# Patient Record
Sex: Male | Born: 1950 | Race: White | Hispanic: No
Health system: Southern US, Community
[De-identification: ages and names within clinical notes are randomized; demographics above are authoritative.]

## PROBLEM LIST (undated history)

## (undated) DIAGNOSIS — K259 Gastric ulcer, unspecified as acute or chronic, without hemorrhage or perforation: Secondary | ICD-10-CM

## (undated) DIAGNOSIS — F419 Anxiety disorder, unspecified: Secondary | ICD-10-CM

## (undated) DIAGNOSIS — R569 Unspecified convulsions: Secondary | ICD-10-CM

## (undated) DIAGNOSIS — F1010 Alcohol abuse, uncomplicated: Secondary | ICD-10-CM

## (undated) DIAGNOSIS — D500 Iron deficiency anemia secondary to blood loss (chronic): Secondary | ICD-10-CM

## (undated) DIAGNOSIS — K209 Esophagitis, unspecified: Secondary | ICD-10-CM

## (undated) DIAGNOSIS — K5520 Angiodysplasia of colon without hemorrhage: Secondary | ICD-10-CM

## (undated) DIAGNOSIS — Z9289 Personal history of other medical treatment: Secondary | ICD-10-CM

## (undated) DIAGNOSIS — I10 Essential (primary) hypertension: Secondary | ICD-10-CM

## (undated) DIAGNOSIS — K922 Gastrointestinal hemorrhage, unspecified: Secondary | ICD-10-CM

## (undated) DIAGNOSIS — S06369A Traumatic hemorrhage of cerebrum, unspecified, with loss of consciousness of unspecified duration, initial encounter: Secondary | ICD-10-CM

## (undated) DIAGNOSIS — I5032 Chronic diastolic (congestive) heart failure: Secondary | ICD-10-CM

## (undated) DIAGNOSIS — Z720 Tobacco use: Secondary | ICD-10-CM

## (undated) HISTORY — PX: CATARACT EXTRACTION W/ INTRAOCULAR LENS  IMPLANT, BILATERAL: SHX1307

## (undated) HISTORY — DX: Traumatic hemorrhage of cerebrum, unspecified, with loss of consciousness of unspecified duration, initial encounter: S06.369A

## (undated) HISTORY — DX: Gastric ulcer, unspecified as acute or chronic, without hemorrhage or perforation: K25.9

## (undated) HISTORY — DX: Alcohol abuse, uncomplicated: F10.10

## (undated) HISTORY — DX: Tobacco use: Z72.0

## (undated) HISTORY — DX: Angiodysplasia of colon without hemorrhage: K55.20

## (undated) HISTORY — DX: Unspecified convulsions: R56.9

## (undated) HISTORY — DX: Esophagitis, unspecified: K20.9

## (undated) HISTORY — DX: Iron deficiency anemia secondary to blood loss (chronic): D50.0

## (undated) HISTORY — DX: Chronic diastolic (congestive) heart failure: I50.32

---

## 2006-12-01 DIAGNOSIS — S0636AA Traumatic hemorrhage of cerebrum, unspecified, with loss of consciousness status unknown, initial encounter: Secondary | ICD-10-CM

## 2006-12-01 DIAGNOSIS — S06369A Traumatic hemorrhage of cerebrum, unspecified, with loss of consciousness of unspecified duration, initial encounter: Secondary | ICD-10-CM

## 2006-12-01 HISTORY — DX: Traumatic hemorrhage of cerebrum, unspecified, with loss of consciousness of unspecified duration, initial encounter: S06.369A

## 2006-12-01 HISTORY — DX: Traumatic hemorrhage of cerebrum, unspecified, with loss of consciousness status unknown, initial encounter: S06.36AA

## 2009-06-21 ENCOUNTER — Encounter: Admission: RE | Admit: 2009-06-21 | Discharge: 2009-06-21 | Payer: Self-pay | Admitting: Neurology

## 2011-09-26 ENCOUNTER — Other Ambulatory Visit: Payer: Self-pay

## 2011-09-26 ENCOUNTER — Encounter: Payer: Self-pay | Admitting: *Deleted

## 2011-09-26 ENCOUNTER — Emergency Department (HOSPITAL_COMMUNITY): Payer: Medicare Other

## 2011-09-26 ENCOUNTER — Inpatient Hospital Stay (HOSPITAL_COMMUNITY)
Admission: EM | Admit: 2011-09-26 | Discharge: 2011-09-28 | DRG: 812 | Disposition: A | Discharge status: Home / Self Care | Payer: Medicare Other | Attending: Internal Medicine | Admitting: Internal Medicine

## 2011-09-26 DIAGNOSIS — R0789 Other chest pain: Secondary | ICD-10-CM | POA: Diagnosis present

## 2011-09-26 DIAGNOSIS — D6489 Other specified anemias: Principal | ICD-10-CM | POA: Diagnosis present

## 2011-09-26 DIAGNOSIS — Z23 Encounter for immunization: Secondary | ICD-10-CM

## 2011-09-26 DIAGNOSIS — F17200 Nicotine dependence, unspecified, uncomplicated: Secondary | ICD-10-CM | POA: Diagnosis present

## 2011-09-26 DIAGNOSIS — Z8669 Personal history of other diseases of the nervous system and sense organs: Secondary | ICD-10-CM | POA: Insufficient documentation

## 2011-09-26 DIAGNOSIS — N179 Acute kidney failure, unspecified: Secondary | ICD-10-CM | POA: Diagnosis present

## 2011-09-26 DIAGNOSIS — D649 Anemia, unspecified: Secondary | ICD-10-CM | POA: Diagnosis present

## 2011-09-26 DIAGNOSIS — R079 Chest pain, unspecified: Secondary | ICD-10-CM | POA: Diagnosis present

## 2011-09-26 DIAGNOSIS — I951 Orthostatic hypotension: Secondary | ICD-10-CM | POA: Diagnosis present

## 2011-09-26 DIAGNOSIS — Z87828 Personal history of other (healed) physical injury and trauma: Secondary | ICD-10-CM | POA: Insufficient documentation

## 2011-09-26 DIAGNOSIS — Z7982 Long term (current) use of aspirin: Secondary | ICD-10-CM

## 2011-09-26 LAB — BASIC METABOLIC PANEL
BUN: 27 mg/dL — ABNORMAL HIGH (ref 6–23)
CO2: 23 mEq/L (ref 19–32)
Chloride: 96 mEq/L (ref 96–112)
Glucose, Bld: 247 mg/dL — ABNORMAL HIGH (ref 70–99)
Potassium: 3.7 mEq/L (ref 3.5–5.1)
Sodium: 133 mEq/L — ABNORMAL LOW (ref 135–145)

## 2011-09-26 LAB — DIFFERENTIAL
Eosinophils Absolute: 0 10*3/uL (ref 0.0–0.7)
Lymphocytes Relative: 16 % (ref 12–46)
Lymphs Abs: 0.8 10*3/uL (ref 0.7–4.0)
Monocytes Relative: 14 % — ABNORMAL HIGH (ref 3–12)
Neutro Abs: 3.5 10*3/uL (ref 1.7–7.7)
Neutrophils Relative %: 69 % (ref 43–77)

## 2011-09-26 LAB — PROTIME-INR
INR: 1.04 (ref 0.00–1.49)
Prothrombin Time: 13.8 seconds (ref 11.6–15.2)

## 2011-09-26 LAB — PREPARE RBC (CROSSMATCH)

## 2011-09-26 LAB — CBC
Hemoglobin: 7 g/dL — ABNORMAL LOW (ref 13.0–17.0)
MCH: 22.1 pg — ABNORMAL LOW (ref 26.0–34.0)
Platelets: 334 10*3/uL (ref 150–400)
RBC: 3.17 MIL/uL — ABNORMAL LOW (ref 4.22–5.81)
WBC: 5 10*3/uL (ref 4.0–10.5)

## 2011-09-26 LAB — POCT I-STAT TROPONIN I

## 2011-09-26 LAB — APTT: aPTT: 27 seconds (ref 24–37)

## 2011-09-26 MED ORDER — PANTOPRAZOLE SODIUM 40 MG IV SOLR
40.0000 mg | Freq: Two times a day (BID) | INTRAVENOUS | Status: DC
  Administered 2011-09-27 (×2): 40 mg via INTRAVENOUS
  Filled 2011-09-26 (×5): qty 40

## 2011-09-26 MED ORDER — INSULIN ASPART 100 UNIT/ML ~~LOC~~ SOLN
0.0000 [IU] | Freq: Three times a day (TID) | SUBCUTANEOUS | Status: DC
  Administered 2011-09-27: 2 [IU] via SUBCUTANEOUS
  Filled 2011-09-26: qty 3

## 2011-09-26 MED ORDER — SODIUM CHLORIDE 0.9 % IV SOLN
80.0000 mg | Freq: Once | INTRAVENOUS | Status: AC
  Administered 2011-09-26: 80 mg via INTRAVENOUS
  Filled 2011-09-26: qty 80

## 2011-09-26 MED ORDER — ALUM & MAG HYDROXIDE-SIMETH 200-200-20 MG/5ML PO SUSP: 30.0000 mL | Freq: Four times a day (QID) | ORAL | Status: DC | PRN

## 2011-09-26 MED ORDER — MORPHINE SULFATE 2 MG/ML IJ SOLN: 1.0000 mg | INTRAMUSCULAR | Status: DC | PRN

## 2011-09-26 MED ORDER — SODIUM CHLORIDE 0.9 % IV SOLN
INTRAVENOUS | Status: DC
  Administered 2011-09-27: 03:00:00 via INTRAVENOUS

## 2011-09-26 MED ORDER — ONDANSETRON HCL 4 MG/2ML IJ SOLN: 4.0000 mg | Freq: Four times a day (QID) | INTRAMUSCULAR | Status: DC | PRN

## 2011-09-26 MED ORDER — ACETAMINOPHEN 650 MG RE SUPP: 650.0000 mg | Freq: Four times a day (QID) | RECTAL | Status: DC | PRN

## 2011-09-26 MED ORDER — SODIUM CHLORIDE 0.9 % IV SOLN
8.0000 mg/h | INTRAVENOUS | Status: DC
  Administered 2011-09-26: 8 mg/h via INTRAVENOUS
  Filled 2011-09-26 (×2): qty 80

## 2011-09-26 MED ORDER — ONDANSETRON HCL 4 MG PO TABS: 4.0000 mg | ORAL_TABLET | Freq: Four times a day (QID) | ORAL | Status: DC | PRN

## 2011-09-26 MED ORDER — ACETAMINOPHEN 325 MG PO TABS: 650.0000 mg | ORAL_TABLET | Freq: Four times a day (QID) | ORAL | Status: DC | PRN

## 2011-09-26 MED ORDER — ASPIRIN 81 MG PO CHEW
324.0000 mg | CHEWABLE_TABLET | Freq: Once | ORAL | Status: AC
Start: 2011-09-26 — End: 2011-09-26
  Administered 2011-09-26: 324 mg via ORAL
  Filled 2011-09-26: qty 4

## 2011-09-26 NOTE — ED Provider Notes (Signed)
History     CSN: [id number]  Arrival date & time 09/26/11  1508   First MD Initiated Contact with Patient 09/26/11 1606      Chief Complaint  Patient presents with  . Chest Pain  . Shortness of Breath    (Consider location/radiation/quality/duration/timing/severity/associated sxs/prior treatment) HPI Comments: States had a similar episode approximately one week ago but did not last throughout the day.  Patient is a 60 y.o. male presenting with chest pain. The history is provided by the patient. No language interpreter was used.  Chest Pain The chest pain began 6 - 12 hours ago. Chest pain occurs intermittently. The chest pain is worsening. Associated with: no specific exacerbating measures. The severity of the pain is moderate. The quality of the pain is described as aching. The pain radiates to the lower back. Primary symptoms include palpitations. Pertinent negatives for primary symptoms include no fatigue, no syncope, no abdominal pain, no nausea, no vomiting, no dizziness and no altered mental status.  The palpitations began 6 to 12 hours ago. The onset of the palpitations was gradual. An episode of palpitations lasts for less than 5 minutes. The palpitations have occurred 2 to 5 time(s). The palpitations did not occur with dizziness.  Pertinent negatives for associated symptoms include no claudication, no diaphoresis, no near-syncope, no numbness and no weakness. He tried nothing for the symptoms.     Past Medical History  Diagnosis Date  . Seizures   . Head injury     History reviewed. No pertinent past surgical history.  History reviewed. No pertinent family history.  History  Substance Use Topics  . Smoking status: Current Everyday Smoker    Types: Cigarettes  . Smokeless tobacco: Not on file  . Alcohol Use:       Review of Systems  Constitutional: Negative for diaphoresis, activity change, appetite change and fatigue.  HENT: Negative for congestion, neck pain  and neck stiffness.   Respiratory: Negative for chest tightness.   Cardiovascular: Positive for chest pain and palpitations. Negative for claudication, syncope and near-syncope.  Gastrointestinal: Negative for nausea, vomiting and abdominal pain.  Genitourinary: Negative for dysuria, urgency, frequency and flank pain.  Musculoskeletal: Positive for back pain. Negative for myalgias, arthralgias and gait problem.  Neurological: Negative for dizziness, weakness, light-headedness, numbness and headaches.  Psychiatric/Behavioral: Negative for altered mental status.  All other systems reviewed and are negative.    Allergies  Review of patient's allergies indicates no known allergies.  Home Medications   Current Outpatient Rx  Name Route Sig Dispense Refill  . ASPIRIN 81 MG PO TABS Oral Take 81 mg by mouth daily.        BP 115/64  Pulse 96  Temp(Src) 97.4 F (36.3 C) (Oral)  Resp 18  SpO2 100%  Physical Exam  Nursing note and vitals reviewed. Constitutional: He is oriented to person, place, and time. He appears well-developed and well-nourished. No distress.  HENT:  Head: Normocephalic and atraumatic.  Mouth/Throat: Oropharynx is clear and moist.  Eyes: Conjunctivae and EOM are normal. Pupils are equal, round, and reactive to light.  Neck: Normal range of motion. Neck supple.  Cardiovascular: Normal rate, regular rhythm, normal heart sounds and intact distal pulses.  Exam reveals no gallop and no friction rub.   No murmur heard. Pulmonary/Chest: Effort normal and breath sounds normal. No respiratory distress. He has no wheezes. He exhibits no tenderness.  Abdominal: Soft. Bowel sounds are normal. There is no tenderness.  Genitourinary: Rectum normal. Guaiac  negative stool.  Musculoskeletal: Normal range of motion. He exhibits no tenderness.  Neurological: He is alert and oriented to person, place, and time. He has normal strength. No cranial nerve deficit or sensory deficit.    Skin: Skin is warm and dry. No rash noted.    ED Course  Procedures (including critical care time)   Date: 09/26/2011  Rate: 93  Rhythm: normal sinus rhythm  QRS Axis: normal  Intervals: normal  ST/T Wave abnormalities: normal  Conduction Disutrbances:none  Narrative Interpretation:   Old EKG Reviewed: none available  Labs Reviewed  CBC - Abnormal; Notable for the following:    RBC 3.17 (*)    Hemoglobin 7.0 (*)    HCT 23.0 (*)    MCV 72.6 (*)    MCH 22.1 (*)    RDW 16.9 (*)    All other components within normal limits  DIFFERENTIAL - Abnormal; Notable for the following:    Monocytes Relative 14 (*)    All other components within normal limits  BASIC METABOLIC PANEL - Abnormal; Notable for the following:    Sodium 133 (*)    Glucose, Bld 247 (*)    BUN 27 (*)    Creatinine, Ser 1.68 (*)    GFR calc non Af Amer 43 (*)    GFR calc Af Amer 49 (*)    All other components within normal limits  POCT I-STAT TROPONIN I  I-STAT TROPONIN I  POCT OCCULT BLOOD STOOL, DEVICE  APTT  PROTIME-INR  PREPARE RBC (CROSSMATCH)  TYPE AND SCREEN   Dg Chest 2 View  09/26/2011  *RADIOLOGY REPORT*  Clinical Data: Chest pain, tachycardia, smoker, numbness in both legs  CHEST - 2 VIEW  Comparison: None  Findings: Normal heart size, mediastinal contours, and pulmonary vascularity. Minimal emphysematous and bronchitic changes. No pulmonary infiltrate, pleural effusion or pneumothorax. No acute osseous findings.  IMPRESSION: Minimal emphysematous and bronchitic changes. No acute abnormalities.  Original Report Authenticated By: Lollie Marrow, M.D.     1. Chest pain   2. Anemia       MDM  Chest pain likely secondary to symptomatic anemia. His rectal exam is normal. Did not identify a source of his bleed therefore place him on a protonix gtt. He'll be given 2 units of packed red cells. He was given aspirin. He is chest pain-free. He has no primary care physician and was admitted to the  teaching service.        Dayton Bailiff, MD 09/26/11 [phone]

## 2011-09-26 NOTE — H&P (Signed)
Hospital Admission Note Date: 09/26/2011  Patient name: Troy Torres Medical record number: [id number] Date of birth: [birth year] Age: 60 y.o. Gender: male PCP: No primary provider on file.  Medical Service: B 1 [facility]  Attending physician:   Dr. Aundria Rud 1st Contact:   Dr. Dorise Hiss [phone] 2nd Contact:   Dr. Tonny Branch [phone] After 5 pm or weekends: 1st Contact:      Pager: [phone] 2nd Contact:      Pager: [phone]  Chief Complaint: chest pain and dizziness with position change  History of Present Illness: The pt is a 60 year-old male who comes into the ED with a 1 day history of chest pain. He stated that it started after he was eating some meatballs and he got the pain in his chest that radiated into his throat. It did not go into his head or jaw or arms. He sat down and the pain resolved within 5 minutes. After a while he stood and got dizzy feeling as though he would pass out. Also the pain returned. At that point he sat back down and was lying on the couch and the pain and dizziness resolved. He denied any sick contacts or symptoms of cold or flu recently. He did have one dark bowel movement in the last several weeks. He has also noticed recently an episode of red urine and has recently been urinating multiple times per day. No burning or dysuria. He does not have problems starting his stream. He is also having some low back pain associated with this chest pain episode and some numbness in his feet. He states that he has not seen a doctor in some time. He has lost about 10 pounds in the last 8 months. He is a .5 PPD smoker. He does occasionally get a sour taste in his mouth.   Meds: Medications Prior to Admission  Medication Dose Route Frequency Provider Last Rate Last Dose  . aspirin chewable tablet 324 mg  324 mg Oral Once Dayton Bailiff, MD   324 mg at 09/26/11 1637  . pantoprazole (PROTONIX) 80 mg in sodium chloride 0.9 % 100 mL IVPB  80 mg Intravenous Once Dayton Bailiff, MD        . pantoprazole (PROTONIX) 80 mg in sodium chloride 0.9 % 250 mL infusion  8 mg/hr Intravenous Continuous Dayton Bailiff, MD       No current outpatient prescriptions on file as of 09/26/2011.    Allergies: Review of patient's allergies indicates no known allergies. Past Medical History  Diagnosis Date  . Seizures   . Head injury    History reviewed. No pertinent past surgical history. History reviewed. No pertinent family history. History   Social History  . Marital Status: Single    Spouse Name: N/A    Number of Children: N/A  . Years of Education: N/A   Occupational History  . Not on file.   Social History Main Topics  . Smoking status: Current Everyday Smoker    Types: Cigarettes  . Smokeless tobacco: Not on file  . Alcohol Use:   . Drug Use:   . Sexually Active:    Other Topics Concern  . Not on file   Social History Narrative  . No narrative on file    Review of Systems: Pertinent items are noted in HPI.  Physical Exam: Blood pressure 115/64, pulse 96, temperature 97.4 F (36.3 C), temperature source Oral, resp. rate 18, SpO2 100.00%. General: resting in bed, pleasant, cooperative HEENT: PERRL, EOMI, no scleral  icterus Cardiac: RRR, no rubs, murmurs or gallops Pulm: clear to auscultation bilaterally, moving normal volumes of air Abd: soft, nontender, nondistended, BS present Ext: warm and well perfused, no pedal edema Neuro: alert and oriented X3, cranial nerves II-XII grossly intact   Lab results: Basic Metabolic Panel:  Basename 09/26/11 1633  NA 133*  K 3.7  CL 96  CO2 23  GLUCOSE 247*  BUN 27*  CREATININE 1.68*  CALCIUM 9.8  MG --  PHOS --  CBC:  Basename 09/26/11 1633  WBC 5.0  NEUTROABS 3.5  HGB 7.0*  HCT 23.0*  MCV 72.6*  PLT 334   Cardiac Enzymes: Troponin ED 0.01  Coagulation:  Basename 09/26/11 1811  LABPROT 13.8  INR 1.04     Imaging results:  Dg Chest 2 View  09/26/2011  *RADIOLOGY REPORT*  Clinical Data:  Chest pain, tachycardia, smoker, numbness in both legs  CHEST - 2 VIEW  Comparison: None  Findings: Normal heart size, mediastinal contours, and pulmonary vascularity. Minimal emphysematous and bronchitic changes. No pulmonary infiltrate, pleural effusion or pneumothorax. No acute osseous findings.  IMPRESSION: Minimal emphysematous and bronchitic changes. No acute abnormalities.  Original Report Authenticated By: Lollie Marrow, M.D.    Other results: EKG: there are no previous tracings available for comparison, normal sinus rhythm, ST elevation in V3, questionable elevation in V4.  Assessment & Plan by Problem: Principal Problem:  *Anemia - Hg 7.0. Will give 2 units of PRBCs in the hospital and follow his CBC. If he is hemodynamically stable then he may be able to have colonoscopy and EGD as out-pt. If he is still having drops in Hg during hospital stay then he may need then as an in-pt. Will check anemia panel and FOBT several more times. Concerning for cancer as there has been weight loss and probable iron deficiency anemia. One episode of dark bowel movement in patient history. Getting protonix and maalox.   Dizziness - probable orthostatic hypotension. BP is only 115/64 and baseline is unknown.    AKI - Cr is 1.69. Baseline is unknown but may be due to anemia causing relative hypovolemia to the kidneys.   Hyperglycemia - will check HgA1C especially considering family history of DM. Will put on SSI in case blood sugars remain elevated.   Active Problems:  Chest pain - likely due to ischemia related angina. Will trend enzymes. No pattern for ischemia on the EKG and no elevation of troponin in the ED.    SignedGenella Mech 09/26/2011, 6:44 PM

## 2011-09-26 NOTE — ED Notes (Signed)
4710-01 READY

## 2011-09-26 NOTE — ED Notes (Signed)
Pt reports waking up this am with sob, lower back pain and chest pain. Airway is intact. ekg done at triage.

## 2011-09-27 ENCOUNTER — Encounter (HOSPITAL_COMMUNITY): Payer: Self-pay

## 2011-09-27 LAB — LIPID PANEL
Cholesterol: 190 mg/dL (ref 0–200)
HDL: 64 mg/dL (ref >39)
LDL Cholesterol: 112 mg/dL — ABNORMAL HIGH (ref 0–99)
Total CHOL/HDL Ratio: 3 RATIO
Triglycerides: 69 mg/dL (ref <150)
VLDL: 14 mg/dL (ref 0–40)

## 2011-09-27 LAB — URINALYSIS, ROUTINE W REFLEX MICROSCOPIC
Hgb urine dipstick: NEGATIVE
Nitrite: NEGATIVE
Protein, ur: NEGATIVE mg/dL
Specific Gravity, Urine: 1.024 (ref 1.005–1.030)
Urobilinogen, UA: 1 mg/dL (ref 0.0–1.0)

## 2011-09-27 LAB — GLUCOSE, CAPILLARY
Glucose-Capillary: 118 mg/dL — ABNORMAL HIGH (ref 70–99)
Glucose-Capillary: 118 mg/dL — ABNORMAL HIGH (ref 70–99)
Glucose-Capillary: 110 mg/dL — ABNORMAL HIGH (ref 70–99)

## 2011-09-27 LAB — HEPATIC FUNCTION PANEL
Bilirubin, Direct: 0.1 mg/dL (ref 0.0–0.3)
Indirect Bilirubin: 0.6 mg/dL (ref 0.3–0.9)
Total Protein: 6.8 g/dL (ref 6.0–8.3)

## 2011-09-27 LAB — CARDIAC PANEL(CRET KIN+CKTOT+MB+TROPI)
CK, MB: 3.7 ng/mL (ref 0.3–4.0)
Relative Index: 3.2 — ABNORMAL HIGH (ref 0.0–2.5)
Relative Index: 2.9 — ABNORMAL HIGH (ref 0.0–2.5)
Total CK: 119 U/L (ref 7–232)
Total CK: 140 U/L (ref 7–232)
Troponin I: <0.3 ng/mL (ref <0.30)

## 2011-09-27 LAB — BASIC METABOLIC PANEL
BUN: 23 mg/dL (ref 6–23)
Creatinine, Ser: 1.36 mg/dL — ABNORMAL HIGH (ref 0.50–1.35)
GFR calc non Af Amer: 55 mL/min — ABNORMAL LOW (ref >90)
Glucose, Bld: 83 mg/dL (ref 70–99)
Potassium: 3.9 mEq/L (ref 3.5–5.1)

## 2011-09-27 LAB — HEMOGLOBIN A1C: Mean Plasma Glucose: 108 mg/dL (ref <117)

## 2011-09-27 LAB — IRON AND TIBC: UIBC: 380 ug/dL (ref 125–400)

## 2011-09-27 LAB — CBC
MCH: 23.7 pg — ABNORMAL LOW (ref 26.0–34.0)
MCV: 74.2 fL — ABNORMAL LOW (ref 78.0–100.0)
Platelets: 313 10*3/uL (ref 150–400)
RBC: 3.72 MIL/uL — ABNORMAL LOW (ref 4.22–5.81)
RDW: 17.1 % — ABNORMAL HIGH (ref 11.5–15.5)
WBC: 5.9 10*3/uL (ref 4.0–10.5)

## 2011-09-27 LAB — RETICULOCYTES
RBC.: 3.41 MIL/uL — ABNORMAL LOW (ref 4.22–5.81)
Retic Count, Absolute: 40.9 10*3/uL (ref 19.0–186.0)
Retic Ct Pct: 1.2 % (ref 0.4–3.1)

## 2011-09-27 LAB — FERRITIN: Ferritin: 6 ng/mL — ABNORMAL LOW (ref 22–322)

## 2011-09-27 LAB — TSH: TSH: 0.925 u[IU]/mL (ref 0.350–4.500)

## 2011-09-27 MED ORDER — FERROUS SULFATE 325 (65 FE) MG PO TABS
325.0000 mg | ORAL_TABLET | Freq: Three times a day (TID) | ORAL | Status: DC
  Administered 2011-09-27 – 2011-09-28 (×3): 325 mg via ORAL
  Filled 2011-09-27 (×5): qty 1

## 2011-09-27 MED ORDER — INFLUENZA VIRUS VACC SPLIT PF IM SUSP
0.5000 mL | INTRAMUSCULAR | Status: AC
  Administered 2011-09-27: 0.5 mL via INTRAMUSCULAR
  Filled 2011-09-27: qty 0.5

## 2011-09-27 NOTE — Progress Notes (Signed)
Subjective: Pt is sitting up in bed, he is feeling fine. He is not having any bleeding anywhere, no chest pain. He has not felt dizzy upon standing. He would like to shower this morning. No pain anywhere and no SOB. He did receive 2 units of PRBCs overnight and his Hg did improve. We talked about the fact that he will need colonoscopy and it may be as an out patient. He verbalized understanding and agreement with the plan.   Objective: Vital signs in last 24 hours: Filed Vitals:   09/27/11 0130 09/27/11 0145 09/27/11 0200 09/27/11 0215  BP: 119/70 126/71 167/74 146/68  Pulse: 60 62 65 67  Temp: 98.2 F (36.8 C) 98.5 F (36.9 C) 98.6 F (37 C) 98.2 F (36.8 C)  TempSrc: Oral Oral Oral Oral  Resp: 18 18 16 18   Height:      Weight:      SpO2:       Weight change:   Intake/Output Summary (Last 24 hours) at 09/27/11 1049 Last data filed at 09/27/11 0803  Gross per 24 hour  Intake 1082.5 ml  Output    201 ml  Net  881.5 ml   Physical Exam: General: resting in bed HEENT: PERRL, EOMI, no scleral icterus Cardiac: RRR, no rubs, murmurs or gallops Pulm: clear to auscultation bilaterally, moving normal volumes of air Abd: soft, nontender, nondistended, BS present Ext: warm and well perfused, no pedal edema Neuro: alert and oriented X3, cranial nerves II-XII grossly intact  Lab Results: Basic Metabolic Panel:  Lab 09/27/11 1610 09/26/11 1633  NA 137 133*  K 3.9 3.7  CL 103 96  CO2 24 23  GLUCOSE 83 247*  BUN 23 27*  CREATININE 1.36* 1.68*  CALCIUM 9.0 9.8  MG -- --  PHOS -- --   Liver Function Tests:  Lab 09/26/11 2341  AST 19  ALT 13  ALKPHOS 72  BILITOT 0.7  PROT 6.8  ALBUMIN 3.6   CBC:  Lab 09/27/11 0600 09/26/11 1633  WBC 5.9 5.0  NEUTROABS -- 3.5  HGB 8.8* 7.0*  HCT 27.6* 23.0*  MCV 74.2* 72.6*  PLT 313 334   Cardiac Enzymes:  Lab 09/27/11 0600 09/26/11 2349  CKTOTAL 113 119  CKMB 3.3 3.8  CKMBINDEX -- --  TROPONINI <0.30 <0.30   CBG:  Lab  09/27/11 0551  GLUCAP 190*   Hemoglobin A1C:  Lab 09/26/11 2341  HGBA1C 5.4   Fasting Lipid Panel:  Lab 09/26/11 2340  CHOL 190  HDL 64  LDLCALC 112*  TRIG 69  CHOLHDL 3.0  LDLDIRECT --   Thyroid Function Tests:  Lab 09/26/11 2341  TSH 0.925  T4TOTAL --  FREET4 --  T3FREE --  THYROIDAB --   Coagulation:  Lab 09/26/11 1811  LABPROT 13.8  INR 1.04   Anemia Panel:  Lab 09/26/11 2341  VITAMINB12 547  FOLATE 14.9  FERRITIN 6*  TIBC 407  IRON 27*  RETICCTPCT 1.2   Studies/Results: Dg Chest 2 View  09/26/2011  *RADIOLOGY REPORT*  Clinical Data: Chest pain, tachycardia, smoker, numbness in both legs  CHEST - 2 VIEW  Comparison: None  Findings: Normal heart size, mediastinal contours, and pulmonary vascularity. Minimal emphysematous and bronchitic changes. No pulmonary infiltrate, pleural effusion or pneumothorax. No acute osseous findings.  IMPRESSION: Minimal emphysematous and bronchitic changes. No acute abnormalities.  Original Report Authenticated By: Lollie Marrow, M.D.   Medications: I have reviewed the patient's current medications. Scheduled Meds:   . aspirin  324 mg Oral Once  . influenza  inactive virus vaccine  0.5 mL Intramuscular NOW  . insulin aspart  0-9 Units Subcutaneous TID WC  . pantoprazole (PROTONIX) IV  80 mg Intravenous Once  . pantoprazole (PROTONIX) IV  40 mg Intravenous Q12H   Continuous Infusions:   . DISCONTD: sodium chloride 50 mL/hr at 09/27/11 0254  . DISCONTD: pantoprozole (PROTONIX) infusion 8 mg/hr (09/26/11 2111)   PRN Meds:.acetaminophen, acetaminophen, alum & mag hydroxide-simeth, morphine, ondansetron (ZOFRAN) IV, ondansetron Assessment/Plan: Principal Problem:  *Anemia - spoke with the pt about the fact that this could be a serious problem such as cancer and he needs colonoscopy. He verbalized understanding and agreement. Will call GI office and schedule as out-pt. Spoke with GI and they felt that this could wait until  out-pt. Will watch pt overnight for stabilization of his CBC for 24 hours. His Hg improved to 8.8 with 2 units of PRBCs.   Active Problems:  Chest pain  - resolved, no elevation in the CE's. Likely due to the anemia.    Acute kidney injury - will follow and get urine creatinine and sodium to determine if it is pre-renal. Cr is down to 1.36 today so likely was pre-renal. He is tolerating PO well so will saline lock his IVF so that he can shower. Will follow his BMP tomorrow.    LOS: 1 day   Genella Mech 09/27/2011, 10:49 AM

## 2011-09-27 NOTE — H&P (Signed)
79 man with several months of wgt loss and few months of intermittant red blood in stool.  Admitted for 1-2 days of dizzyness. Found to have hgb 7 with MCV 72 and ferritin 6.  He has no pain and is feeling better after 2 units.  Creatinine of 1.7 may be pre-renal as UA is normal.  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  Will ask GI to arrange colonoscopy in near future. No other worrisome labs or other findings.  Agree with residents' plans and notes.

## 2011-09-28 LAB — GLUCOSE, CAPILLARY: Glucose-Capillary: 90 mg/dL (ref 70–99)

## 2011-09-28 LAB — CBC
MCV: 74.7 fL — ABNORMAL LOW (ref 78.0–100.0)
Platelets: 307 10*3/uL (ref 150–400)
RBC: 3.96 MIL/uL — ABNORMAL LOW (ref 4.22–5.81)
WBC: 4.5 10*3/uL (ref 4.0–10.5)

## 2011-09-28 LAB — BASIC METABOLIC PANEL
CO2: 25 mEq/L (ref 19–32)
Chloride: 105 mEq/L (ref 96–112)
Sodium: 140 mEq/L (ref 135–145)

## 2011-09-28 LAB — TYPE AND SCREEN
ABO/RH(D): B POS
Unit division: 0

## 2011-09-28 MED ORDER — PANTOPRAZOLE SODIUM 40 MG PO TBEC
40.0000 mg | DELAYED_RELEASE_TABLET | Freq: Two times a day (BID) | ORAL | Status: DC
  Administered 2011-09-28: 40 mg via ORAL
  Filled 2011-09-28: qty 1

## 2011-09-28 MED ORDER — FERROUS SULFATE 325 (65 FE) MG PO TABS: 325.0000 mg | ORAL_TABLET | Freq: Three times a day (TID) | ORAL | Status: DC

## 2011-09-28 NOTE — Progress Notes (Signed)
Internal Medicine Attending  Date: 09/28/2011  Patient name: Troy Torres Medical record number: [id number] Date of birth: 03/27/1951 Age: 60 y.o. Gender: male  I saw and evaluated the patient. I reviewed the resident's note by Dr. Dorise Hiss and I agree with the resident's findings and plans as documented in her note.  I discussed with patient the importance of the planned outpatient GI evaluation given his anemia and weight loss.

## 2011-09-28 NOTE — Discharge Summary (Signed)
Internal Medicine Teaching [facility] Discharge Note  Name: Troy Torres MRN: [id number] DOB: October 07, 1950 60 y.o.  Date of Admission: 09/26/2011  3:09 PM Date of Discharge: 09/28/2011 Attending Physician: Ulyess Mort  Discharge Diagnosis: Principal Problem:  *Anemia Active Problems:  Chest pain  Acute kidney injury   Discharge Medications: Current Discharge Medication List    START taking these medications   Details  ferrous sulfate 325 (65 FE) MG tablet Take 1 tablet (325 mg total) by mouth 3 (three) times daily with meals. Qty: 90 tablet, Refills: 0      CONTINUE these medications which have NOT CHANGED   Details  aspirin 81 MG tablet Take 81 mg by mouth daily.           Disposition and follow-up:   [name] was discharged from [facility] in Good condition.    Follow-up Appointments: [facility], Oct 04 2011 at 1045 AM. At this time please schedule for colonoscopy.   [facility] clinic -  10/16/10 at 11:00 AM with Dr. Larey Seat. At this time please follow up and make sure patient went to [facility] to get colonoscopy and establish a PCP. Recheck CBC to ensure stability. Pt should still be taking iron supplements. Please recheck BMP to evaluate for resolution of AKI.    Consultations:  None  Procedures Performed:  Dg Chest 2 View  09/26/2011  *RADIOLOGY REPORT*  Clinical Data: Chest pain, tachycardia, smoker, numbness in both legs  CHEST - 2 VIEW  Comparison: None  Findings: Normal heart size, mediastinal contours, and pulmonary vascularity. Minimal emphysematous and bronchitic changes. No pulmonary infiltrate, pleural effusion or pneumothorax. No acute osseous findings.  IMPRESSION: Minimal emphysematous and bronchitic changes. No acute abnormalities.  Original Report Authenticated By: Lollie Marrow, M.D.   Admission HPI: The pt is a 60 year-old male who comes into the ED with a 1 day history of chest pain. He stated that it started after he was  eating some meatballs and he got the pain in his chest that radiated into his throat. It did not go into his head or jaw or arms. He sat down and the pain resolved within 5 minutes. After a while he stood and got dizzy feeling as though he would pass out. Also the pain returned. At that point he sat back down and was lying on the couch and the pain and dizziness resolved. He denied any sick contacts or symptoms of cold or flu recently. He did have one dark bowel movement in the last several weeks. He has also noticed recently an episode of red urine and has recently been urinating multiple times per day. No burning or dysuria. He does not have problems starting his stream. He is also having some low back pain associated with this chest pain episode and some numbness in his feet. He states that he has not seen a doctor in some time. He has lost about 10 pounds in the last 8 months. He is a .5 PPD smoker. He does occasionally get a sour taste in his mouth.   Physical Exam: (on admission) Blood pressure 115/64, pulse 96, temperature 97.4 F (36.3 C), temperature source Oral, resp. rate 18, SpO2 100.00%.  General: resting in bed, pleasant, cooperative  HEENT: PERRL, EOMI, no scleral icterus  Cardiac: RRR, no rubs, murmurs or gallops  Pulm: clear to auscultation bilaterally, moving normal volumes of air  Abd: soft, nontender, nondistended, BS present  Ext: warm and well perfused, no pedal edema  Neuro: alert and oriented X3, cranial nerves II-XII grossly intact   Hospital Course by problem list: Principal Problem:  *Anemia - the patient was admitted to with hemoglobin of 7, with no obvious source of bleeding. He was given 2 units of packed red blood cells during his hospital course his hemoglobin improved to 8.8 and then was stable for 24 hours and was 9.3 on the day of discharge. We did talk to GI who felt that a colonoscopy could be completed as an outpatient and a followup appointment with [facility] was  scheduled for January 2. He will get outpatient colonoscopy. We will also see him in our clinic to establish care for her primary prevention. We did run an anemia panel which did show that this anemia is due to iron deficiency. The patient was started on iron during his hospital course. We will continue to have him take as an outpatient. He had no obvious signs of source for bleeding and malignancy of the colon was concerned this concern was discussed with the patient the seriousness of this implication was discussed with him. He did verbalize understanding and agreement with our plan to get outpatient colonoscopy.  Active Problems:  Chest pain - the patient did present with chest pain there was concern for anemia related ischemia versus GERD pain. He also is experiencing some orthostatic hypotension at the time of admission which did resolve during the course of admission. His chest pain did not recur during this hospitalization. 3 sets of cardiac enzymes were obtained and were all negative. An EKG was obtained at the time of admission and was showing no changes concerning for acute myocardial infarction. Patient was informed that if he has a repeat of his chest pain he should seek medical attention.   Acute kidney injury - the patient's creatinine was 1.68 at the time of admission with no current baseline. We did give him IV fluids and draw a urine creatinine and sodium to connect with the male. He now is less than 1% which would indicate prerenal acute kidney injury. The IV fluids did decrease his creatinine and creatinine on the day of discharge was 0.97. Would recommend followup BMP at that some point to evaluate for chronic kidney insufficiency versus acute kidney injury.   Discharge Vitals:  BP 101/58  Pulse 67  Temp(Src) 97.9 F (36.6 C) (Oral)  Resp 16  Ht 5\' 8"  (1.727 m)  Wt 141 lb 11.2 oz (64.275 kg)  BMI 21.55 kg/m2  SpO2 100%  Discharge Labs:  Results for orders placed during the  hospital encounter of 09/26/11 (from the past 24 hour(s))  BASIC METABOLIC PANEL     Status: Abnormal   Collection Time   09/27/11  9:21 AM      Component Value Range   Sodium 137  135 - 145 (mEq/L)   Potassium 3.9  3.5 - 5.1 (mEq/L)   Chloride 103  96 - 112 (mEq/L)   CO2 24  19 - 32 (mEq/L)   Glucose, Bld 83  70 - 99 (mg/dL)   BUN 23  6 - 23 (mg/dL)   Creatinine, Ser 2.44 (*) 0.50 - 1.35 (mg/dL)   Calcium 9.0  8.4 - 01.0 (mg/dL)   GFR calc non Af Amer 55 (*) >90 (mL/min)   GFR calc Af Amer 64 (*) >90 (mL/min)  GLUCOSE, CAPILLARY     Status: Abnormal   Collection Time   09/27/11 11:17 AM      Component Value Range  Glucose-Capillary 118 (*) 70 - 99 (mg/dL)  CARDIAC PANEL(CRET KIN+CKTOT+MB+TROPI)     Status: Abnormal   Collection Time   09/27/11 12:00 PM      Component Value Range   Total CK 140  7 - 232 (U/L)   CK, MB 3.7  0.3 - 4.0 (ng/mL)   Troponin I <0.30  <0.30 (ng/mL)   Relative Index 2.6 (*) 0.0 - 2.5   GLUCOSE, CAPILLARY     Status: Abnormal   Collection Time   09/27/11  4:45 PM      Component Value Range   Glucose-Capillary 118 (*) 70 - 99 (mg/dL)   Comment 1 Notify RN    CREATININE, URINE, RANDOM     Status: Normal   Collection Time   09/27/11  7:39 PM      Component Value Range   Creatinine, Urine 43.04    SODIUM, URINE, RANDOM     Status: Normal   Collection Time   09/27/11  7:39 PM      Component Value Range   Sodium, Ur 24    GLUCOSE, CAPILLARY     Status: Abnormal   Collection Time   09/27/11 10:03 PM      Component Value Range   Glucose-Capillary 110 (*) 70 - 99 (mg/dL)   Comment 1 Notify RN     Comment 2 Documented in Chart    CBC     Status: Abnormal   Collection Time   09/28/11  6:00 AM      Component Value Range   WBC 4.5  4.0 - 10.5 (K/uL)   RBC 3.96 (*) 4.22 - 5.81 (MIL/uL)   Hemoglobin 9.3 (*) 13.0 - 17.0 (g/dL)   HCT 16.1 (*) 09.6 - 52.0 (%)   MCV 74.7 (*) 78.0 - 100.0 (fL)   MCH 23.5 (*) 26.0 - 34.0 (pg)   MCHC 31.4  30.0 - 36.0  (g/dL)   RDW 04.5 (*) 40.9 - 15.5 (%)   Platelets 307  150 - 400 (K/uL)  BASIC METABOLIC PANEL     Status: Abnormal   Collection Time   09/28/11  6:00 AM      Component Value Range   Sodium 140  135 - 145 (mEq/L)   Potassium 4.0  3.5 - 5.1 (mEq/L)   Chloride 105  96 - 112 (mEq/L)   CO2 25  19 - 32 (mEq/L)   Glucose, Bld 95  70 - 99 (mg/dL)   BUN 15  6 - 23 (mg/dL)   Creatinine, Ser 8.11  0.50 - 1.35 (mg/dL)   Calcium 9.2  8.4 - 91.4 (mg/dL)   GFR calc non Af Amer 88 (*) >90 (mL/min)   GFR calc Af Amer >90  >90 (mL/min)  GLUCOSE, CAPILLARY     Status: Normal   Collection Time   09/28/11  6:07 AM      Component Value Range   Glucose-Capillary 90  70 - 99 (mg/dL)    Signed: Genella Mech 09/28/2011, 8:11 AM

## 2011-09-28 NOTE — Progress Notes (Signed)
Subjective: Pt is sitting up in bed, he is feeling fine. He is not having any bleeding anywhere, no chest pain. He has not felt dizzy upon standing. He did shower yesterday. No pain anywhere and no SOB. He did receive 2 units of PRBCs during hospital stay and his Hg did improve. We talked about the fact that he will need colonoscopy and it will be as an out patient. He verbalized understanding and agreement with the plan.   Objective: Vital signs in last 24 hours: Filed Vitals:   09/27/11 0215 09/27/11 1442 09/27/11 2205 09/28/11 0446  BP: 146/68 121/70 102/69 101/58  Pulse: 67 72 63 67  Temp: 98.2 F (36.8 C) 98 F (36.7 C) 98.1 F (36.7 C) 97.9 F (36.6 C)  TempSrc: Oral  Oral Oral  Resp: 18 18 18 16   Height:      Weight:    141 lb 11.2 oz (64.275 kg)  SpO2:  96% 100% 100%   Weight change: -11.5 oz (-0.325 kg)  Intake/Output Summary (Last 24 hours) at 09/28/11 0740 Last data filed at 09/28/11 0449  Gross per 24 hour  Intake   1180 ml  Output    200 ml  Net    980 ml   Physical Exam: General: resting in bed HEENT: PERRL, EOMI, no scleral icterus Cardiac: RRR, no rubs, murmurs or gallops Pulm: clear to auscultation bilaterally, moving normal volumes of air Abd: soft, nontender, nondistended, BS present Ext: warm and well perfused, no pedal edema Neuro: alert and oriented X3, cranial nerves II-XII grossly intact  Lab Results: Basic Metabolic Panel:  Lab 09/28/11 1610 09/27/11 0921  NA 140 137  K 4.0 3.9  CL 105 103  CO2 25 24  GLUCOSE 95 83  BUN 15 23  CREATININE 0.97 1.36*  CALCIUM 9.2 9.0  MG -- --  PHOS -- --   Liver Function Tests:  Lab 09/26/11 2341  AST 19  ALT 13  ALKPHOS 72  BILITOT 0.7  PROT 6.8  ALBUMIN 3.6   CBC:  Lab 09/28/11 0600 09/27/11 0600 09/26/11 1633  WBC 4.5 5.9 --  NEUTROABS -- -- 3.5  HGB 9.3* 8.8* --  HCT 29.6* 27.6* --  MCV 74.7* 74.2* --  PLT 307 313 --   Cardiac Enzymes:  Lab 09/27/11 1200 09/27/11 0600 09/26/11 2349   CKTOTAL 140 113 119  CKMB 3.7 3.3 3.8  CKMBINDEX -- -- --  TROPONINI <0.30 <0.30 <0.30   CBG:  Lab 09/28/11 0607 09/27/11 2203 09/27/11 1645 09/27/11 1117 09/27/11 0551  GLUCAP 90 110* 118* 118* 190*   Hemoglobin A1C:  Lab 09/26/11 2341  HGBA1C 5.4   Fasting Lipid Panel:  Lab 09/26/11 2340  CHOL 190  HDL 64  LDLCALC 112*  TRIG 69  CHOLHDL 3.0  LDLDIRECT --   Thyroid Function Tests:  Lab 09/26/11 2341  TSH 0.925  T4TOTAL --  FREET4 --  T3FREE --  THYROIDAB --   Coagulation:  Lab 09/26/11 1811  LABPROT 13.8  INR 1.04   Anemia Panel:  Lab 09/26/11 2341  VITAMINB12 547  FOLATE 14.9  FERRITIN 6*  TIBC 407  IRON 27*  RETICCTPCT 1.2   Studies/Results: Dg Chest 2 View  09/26/2011  *RADIOLOGY REPORT*  Clinical Data: Chest pain, tachycardia, smoker, numbness in both legs  CHEST - 2 VIEW  Comparison: None  Findings: Normal heart size, mediastinal contours, and pulmonary vascularity. Minimal emphysematous and bronchitic changes. No pulmonary infiltrate, pleural effusion or pneumothorax. No acute osseous  findings.  IMPRESSION: Minimal emphysematous and bronchitic changes. No acute abnormalities.  Original Report Authenticated By: Lollie Marrow, M.D.   Medications: I have reviewed the patient's current medications. Scheduled Meds:    . ferrous sulfate  325 mg Oral TID WC  . influenza  inactive virus vaccine  0.5 mL Intramuscular NOW  . insulin aspart  0-9 Units Subcutaneous TID WC  . pantoprazole (PROTONIX) IV  40 mg Intravenous Q12H   Continuous Infusions:    . DISCONTD: sodium chloride 50 mL/hr at 09/27/11 0254   PRN Meds:.acetaminophen, acetaminophen, alum & mag hydroxide-simeth, morphine, ondansetron (ZOFRAN) IV, ondansetron Assessment/Plan: Principal Problem:  *Anemia - spoke with the pt about the fact that this could be a serious problem such as cancer and he needs colonoscopy. He verbalized understanding and agreement. Will call GI office and  schedule as out-pt. Spoke with GI and they felt that this could wait until out-pt, pt has apt with [facility] on Jan 2nd at 10:45 AM. Hg is stable and was 9.3 this morning. The patient voiced concern that he has lost about 8 pounds in the last month and I again stressed the need that this may be a malignant cause and he needs to follow up with colonoscopy.   Active Problems:  Chest pain  - resolved, no elevation in the CE's. Likely due to the anemia.    Acute kidney injury - will follow and get urine creatinine and sodium to determine if it is pre-renal. Cr is down to 0.97 today so likely was pre-renal. He is tolerating PO well so will saline lock his IVF so that he can shower.   Disposition - Pt will be discharged home today with the appropriate follow up.   LOS: 2 days   Genella Mech 09/28/2011, 7:40 AM

## 2011-09-28 NOTE — Progress Notes (Signed)
IV removed.  No active bleeding noted at this time.  Verbalized understanding of discharge instructions.

## 2011-10-03 DIAGNOSIS — K5520 Angiodysplasia of colon without hemorrhage: Secondary | ICD-10-CM

## 2011-10-03 DIAGNOSIS — K209 Esophagitis, unspecified without bleeding: Secondary | ICD-10-CM

## 2011-10-03 DIAGNOSIS — K259 Gastric ulcer, unspecified as acute or chronic, without hemorrhage or perforation: Secondary | ICD-10-CM

## 2011-10-03 HISTORY — DX: Esophagitis, unspecified without bleeding: K20.90

## 2011-10-03 HISTORY — DX: Angiodysplasia of colon without hemorrhage: K55.20

## 2011-10-03 HISTORY — DX: Gastric ulcer, unspecified as acute or chronic, without hemorrhage or perforation: K25.9

## 2011-10-17 ENCOUNTER — Encounter: Payer: Medicare Other | Admitting: Internal Medicine

## 2011-10-25 ENCOUNTER — Encounter: Payer: Self-pay | Admitting: Internal Medicine

## 2011-10-25 ENCOUNTER — Ambulatory Visit (INDEPENDENT_AMBULATORY_CARE_PROVIDER_SITE_OTHER): Payer: Medicare Other | Admitting: Internal Medicine

## 2011-10-25 DIAGNOSIS — R079 Chest pain, unspecified: Secondary | ICD-10-CM

## 2011-10-25 DIAGNOSIS — F17200 Nicotine dependence, unspecified, uncomplicated: Secondary | ICD-10-CM

## 2011-10-25 DIAGNOSIS — D649 Anemia, unspecified: Secondary | ICD-10-CM

## 2011-10-25 DIAGNOSIS — N179 Acute kidney failure, unspecified: Secondary | ICD-10-CM

## 2011-10-25 DIAGNOSIS — Z8669 Personal history of other diseases of the nervous system and sense organs: Secondary | ICD-10-CM

## 2011-10-25 MED ORDER — OMEPRAZOLE 20 MG PO CPDR: DELAYED_RELEASE_CAPSULE | ORAL | Status: DC

## 2011-10-25 NOTE — Patient Instructions (Signed)
You can try the 1-800-QUITNOW hotline to assist in stopping smoking You should also no longer drink alcohol as this will irritate your stomach and keep your ulcer from healing  Please return to clinic in 1-3 months to see Dr. Yaakov Guthrie, who will be your new primary care physician

## 2011-10-25 NOTE — Progress Notes (Signed)
Record request sent to Dr Imagene Gurney [facility]).  Records requested and received from Dr Madilyn Fireman [facility]) office.Kingsley Spittle Cassady1/23/201312:03 PM

## 2011-10-26 ENCOUNTER — Encounter: Payer: Self-pay | Admitting: Internal Medicine

## 2011-10-26 DIAGNOSIS — Z720 Tobacco use: Secondary | ICD-10-CM | POA: Insufficient documentation

## 2011-10-26 LAB — CBC
MCH: 25 pg — ABNORMAL LOW (ref 26.0–34.0)
Platelets: 274 10*3/uL (ref 150–400)
RDW: 25.1 % — ABNORMAL HIGH (ref 11.5–15.5)
WBC: 5 10*3/uL (ref 4.0–10.5)

## 2011-10-26 LAB — COMPLETE METABOLIC PANEL WITH GFR
ALT: 11 U/L (ref 0–53)
AST: 17 U/L (ref 0–37)
Albumin: 4.2 g/dL (ref 3.5–5.2)
Alkaline Phosphatase: 51 U/L (ref 39–117)
BUN: 11 mg/dL (ref 6–23)
Calcium: 9.3 mg/dL (ref 8.4–10.5)
Chloride: 106 mEq/L (ref 96–112)
Potassium: 4.5 mEq/L (ref 3.5–5.3)
Total Bilirubin: 0.6 mg/dL (ref 0.3–1.2)

## 2011-10-26 NOTE — Assessment & Plan Note (Signed)
No CP or SOB since hospital discharge

## 2011-10-26 NOTE — Assessment & Plan Note (Addendum)
Iron deficiency- ferritin 6 during admission.  Hgb today 10.7, up from on hospital discharge. No hemoptysis or hematemesis since leaving hospital. Is taking iron pills without discomfort and MCV is up to 83.  Continue iron pills.  GI upper endoscopy earlier this month found gastric ulcer.  Not noted as bleeding at that time in GI records we obtained.  Wrote patient script for prilosec 40mg  daily one month then 20mg  daily indefinitely.  H pylori stain was negative

## 2011-10-26 NOTE — Assessment & Plan Note (Signed)
Cr 0.88 today, back to normal range on discharge and stable today.

## 2011-10-26 NOTE — Assessment & Plan Note (Signed)
Patient interested in quitting.  Gave number for 1-800-Quitnow

## 2011-10-26 NOTE — Progress Notes (Signed)
Addended by: Emeline General on: 10/26/2011 09:28 PM   Modules accepted: Level of Service

## 2011-10-26 NOTE — Assessment & Plan Note (Signed)
Sending for note from his old neurologist Dr. Sandria Manly.  Based on those records will consider restarting anti-sz med or possible neurology referral if either is indicated.

## 2011-10-26 NOTE — Progress Notes (Signed)
  Subjective:   Patient ID: Troy Torres male   DOB: 11/30/1950 61 y.o.   MRN: [id number]  HPI: [name] is a 61 y.o. here for hospital follow-up after being admitted for CP/anemia/AKI.  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  He denies any CP or SOB since discharge.  No hemoptysis or hematemesis.      Past Medical History  Diagnosis Date  . Seizures   . Head injury    Current Outpatient Prescriptions  Medication Sig Dispense Refill  . aspirin 81 MG tablet Take 81 mg by mouth daily.        . ferrous sulfate 325 (65 FE) MG tablet Take 1 tablet (325 mg total) by mouth 3 (three) times daily with meals.  90 tablet  0  . omeprazole (PRILOSEC) 20 MG capsule Take two capsules (40mg ) by mouth once daily for one month, Then take one capsule (20mg ) by mouth once daily after that  60 capsule  6   Family History  Problem Relation Age of Onset  . Diabetes Mother   . Diabetes Sister    History   Social History  . Marital Status: Legally Separated    Spouse Name: N/A    Number of Children: N/A  . Years of Education: N/A   Social History Main Topics  . Smoking status: Current Everyday Smoker -- 0.5 packs/day    Types: Cigarettes  . Smokeless tobacco: None  . Alcohol Use: None  . Drug Use: None  . Sexually Active: None   Other Topics Concern  . None   Social History Narrative  . None   Review of Systems: Constitutional: Denies fever, chills, diaphoresis, appetite change and fatigue.     Respiratory: Denies SOB, DOE, cough, chest tightness,  and wheezing.   Cardiovascular: Denies chest pain, palpitations and leg swelling.  Gastrointestinal: Denies nausea, vomiting, abdominal pain, diarrhea, constipation, blood in stool and abdominal distention.  Genitourinary: Denies dysuria, urgency, frequency, hematuria, flank pain and  difficulty urinating.  Skin: Denies pallor, rash and wound.  Neurological: Denies dizziness, seizures, syncope, weakness, light-headedness, numbness and headaches.    Objective:  Physical Exam: Filed Vitals:   10/25/11 1059  BP: 141/77  Pulse: 64  Temp: 97.2 F (36.2 C)  TempSrc: Oral  Height: 5\' 8"  (1.727 m)  Weight: 150 lb (68.04 kg)   Constitutional: Vital signs reviewed.  Patient is a well-developed and well-nourished man in no acute distress and cooperative with exam. Alert and oriented x3.  Head: Normocephalic and atraumatic  Mouth: no erythema or exudates, MMM Eyes: PERRL, EOMI, conjunctivae normal, No scleral icterus.  Neck: No JVD or carotid bruit present.  Cardiovascular: RRR, S1 normal, S2 normal, no MRG, pulses symmetric and intact bilaterally Pulmonary/Chest: CTAB, no wheezes, rales, or rhonchi Abdominal: Soft. Non-tender, non-distended, bowel sounds are normal   Neurological: A&O x3, Strength is normal and symmetric bilaterally, cranial nerve II-XII are grossly intact, no focal motor deficit, sensory intact to light touch bilaterally.  Skin: Warm, dry and intact. No rash, cyanosis, or clubbing.     Assessment & Plan:

## 2011-10-30 NOTE — Progress Notes (Signed)
agree

## 2011-12-04 ENCOUNTER — Encounter: Payer: Medicare Other | Admitting: Internal Medicine

## 2011-12-04 ENCOUNTER — Encounter: Payer: Self-pay | Admitting: Internal Medicine

## 2011-12-04 NOTE — Progress Notes (Signed)
Records obtained from [facility].  As of 12/16/2009 pt was supposed to be on Depakote ER 500mg  TID for history of seizures.  Patient no-showed to appt today- will attempt to get patient to return to clinic to discuss his seizure history and possibly restart this medicine.

## 2011-12-19 ENCOUNTER — Encounter: Payer: Self-pay | Admitting: Internal Medicine

## 2011-12-19 ENCOUNTER — Other Ambulatory Visit: Payer: Self-pay | Admitting: Family Medicine

## 2011-12-19 ENCOUNTER — Ambulatory Visit (INDEPENDENT_AMBULATORY_CARE_PROVIDER_SITE_OTHER): Payer: Medicare Other | Admitting: Internal Medicine

## 2011-12-19 VITALS — BP 157/90 | HR 99 | Temp 96.5°F | Ht 68.0 in | Wt 147.9 lb

## 2011-12-19 DIAGNOSIS — F17200 Nicotine dependence, unspecified, uncomplicated: Secondary | ICD-10-CM

## 2011-12-19 DIAGNOSIS — Z8669 Personal history of other diseases of the nervous system and sense organs: Secondary | ICD-10-CM

## 2011-12-19 DIAGNOSIS — R251 Tremor, unspecified: Secondary | ICD-10-CM | POA: Insufficient documentation

## 2011-12-19 DIAGNOSIS — K2971 Gastritis, unspecified, with bleeding: Secondary | ICD-10-CM

## 2011-12-19 DIAGNOSIS — K2970 Gastritis, unspecified, without bleeding: Secondary | ICD-10-CM | POA: Insufficient documentation

## 2011-12-19 DIAGNOSIS — K2990 Gastroduodenitis, unspecified, without bleeding: Secondary | ICD-10-CM

## 2011-12-19 DIAGNOSIS — D649 Anemia, unspecified: Secondary | ICD-10-CM

## 2011-12-19 DIAGNOSIS — K259 Gastric ulcer, unspecified as acute or chronic, without hemorrhage or perforation: Secondary | ICD-10-CM | POA: Insufficient documentation

## 2011-12-19 DIAGNOSIS — R112 Nausea with vomiting, unspecified: Secondary | ICD-10-CM | POA: Insufficient documentation

## 2011-12-19 MED ORDER — OMEPRAZOLE 20 MG PO CPDR: DELAYED_RELEASE_CAPSULE | ORAL | Status: DC

## 2011-12-19 NOTE — Assessment & Plan Note (Signed)
Things to smoke cigarettes approximately one pack per week
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
## 2011-12-19 NOTE — Assessment & Plan Note (Signed)
Resume PPI therapy, patient followup with Dr. Madilyn Fireman as needed

## 2011-12-19 NOTE — Patient Instructions (Signed)
Resume taking the omeprazole.  This will help your gastritis and ulcer. If your symptoms do not resolve please call the clinic or follow-up with Dr. Madilyn Fireman.

## 2011-12-19 NOTE — Assessment & Plan Note (Signed)
Likely secondary to exacerbation of gastritis

## 2011-12-19 NOTE — Assessment & Plan Note (Signed)
Patient to resume PPI therapy, prescription for omeprazole had already been refilled the patient did not know that he could pick it up from the pharmacy
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
## 2012-01-01 ENCOUNTER — Encounter: Payer: Self-pay | Admitting: Internal Medicine

## 2012-01-01 ENCOUNTER — Ambulatory Visit (INDEPENDENT_AMBULATORY_CARE_PROVIDER_SITE_OTHER): Payer: Medicare Other | Admitting: Internal Medicine

## 2012-01-01 VITALS — BP 152/79 | HR 69 | Temp 97.0°F | Ht 68.0 in | Wt 153.7 lb

## 2012-01-01 DIAGNOSIS — D649 Anemia, unspecified: Secondary | ICD-10-CM

## 2012-01-01 DIAGNOSIS — Z8669 Personal history of other diseases of the nervous system and sense organs: Secondary | ICD-10-CM

## 2012-01-01 DIAGNOSIS — F17200 Nicotine dependence, unspecified, uncomplicated: Secondary | ICD-10-CM

## 2012-01-01 DIAGNOSIS — K259 Gastric ulcer, unspecified as acute or chronic, without hemorrhage or perforation: Secondary | ICD-10-CM

## 2012-01-01 MED ORDER — VARENICLINE TARTRATE 0.5 MG X 11 & 1 MG X 42 PO MISC: ORAL | Status: DC

## 2012-01-01 MED ORDER — FERROUS SULFATE 325 (65 FE) MG PO TABS: 325.0000 mg | ORAL_TABLET | Freq: Three times a day (TID) | ORAL | Status: DC

## 2012-01-01 MED ORDER — VARENICLINE TARTRATE 1 MG PO TABS: 1.0000 mg | ORAL_TABLET | Freq: Two times a day (BID) | ORAL | Status: DC

## 2012-01-01 NOTE — Patient Instructions (Signed)
Please return to clinic in 3-4 weeks to discuss progress of your smoking cessation

## 2012-01-02 LAB — FERRITIN: Ferritin: 15 ng/mL — ABNORMAL LOW (ref 22–322)

## 2012-01-02 LAB — CBC
MCH: 27.2 pg (ref 26.0–34.0)
MCHC: 32.1 g/dL (ref 30.0–36.0)
MCV: 84.6 fL (ref 78.0–100.0)
Platelets: 324 10*3/uL (ref 150–400)
RBC: 4.75 MIL/uL (ref 4.22–5.81)

## 2012-01-11 ENCOUNTER — Other Ambulatory Visit (INDEPENDENT_AMBULATORY_CARE_PROVIDER_SITE_OTHER): Payer: Medicare Other

## 2012-01-11 DIAGNOSIS — D649 Anemia, unspecified: Secondary | ICD-10-CM

## 2012-01-11 LAB — POC HEMOCCULT BLD/STL (HOME/3-CARD/SCREEN): Card #3 Fecal Occult Blood, POC: POSITIVE

## 2012-01-14 NOTE — Assessment & Plan Note (Signed)
Patient wishes to quit, is down to 1 pack/wk.  Interested in quitting.  Prescribed Chantix today, told him to pick a quit date and start Chantix 1 wk before.  Plan is to continue Chantix for 12 wks, if he quits successfully this may be extended another 12 weeks.  Next return appt will be for smoking cessation fu.

## 2012-01-14 NOTE — Assessment & Plan Note (Signed)
No symptoms of CP, SOB, nausea, or vomiting.  Will check ferritin, cbc, fobt.

## 2012-01-14 NOTE — Assessment & Plan Note (Signed)
Checking CBC, FOBT today, Ferritin.  Will continue iron supplementation for now pending ferritin result

## 2012-01-14 NOTE — Progress Notes (Signed)
  Subjective:   Patient ID: Troy Torres male   DOB: 26-Jan-1951 61 y.o.   MRN: [id number]  HPI: [name] is a 61 y.o. with history of gastric ulcer and gastritis here for follow-up.  No CP, SOB, or nausea or vomiting.  Ran out of iron supplement a couple weeks ago.    Quit drinking 2 months ago.    Past Medical History  Diagnosis Date  . Seizures   . Head injury    Current Outpatient Prescriptions  Medication Sig Dispense Refill  . aspirin 81 MG tablet Take 81 mg by mouth daily.        . ferrous sulfate 325 (65 FE) MG tablet Take 1 tablet (325 mg total) by mouth 3 (three) times daily with meals.  90 tablet  3  . omeprazole (PRILOSEC) 20 MG capsule Take two capsules (40mg ) by mouth once daily for one month, Then take one capsule (20mg ) by mouth once daily after that  60 capsule  6  . varenicline (CHANTIX CONTINUING MONTH PAK) 1 MG tablet Take 1 tablet (1 mg total) by mouth 2 (two) times daily.  60 tablet  1  . varenicline (CHANTIX STARTING MONTH PAK) 0.5 MG X 11 & 1 MG X 42 tablet Take one 0.5 mg pill by mouth once daily for 3 days, then take one 0.5 mg pill twice daily for 4 days, then take one 1 mg pill twice daily.  53 tablet  0   Family History  Problem Relation Age of Onset  . Diabetes Mother   . Diabetes Sister    History   Social History  . Marital Status: Legally Separated    Spouse Name: N/A    Number of Children: N/A  . Years of Education: N/A   Social History Main Topics  . Smoking status: Current Everyday Smoker -- 0.2 packs/day    Types: Cigarettes  . Smokeless tobacco: None  . Alcohol Use: None  . Drug Use: None  . Sexually Active: None   Other Topics Concern  . None   Social History Narrative  . None   Review of Systems: Constitutional: Denies fever, chills, diaphoresis, appetite change and fatigue.  Respiratory: Denies SOB, DOE, cough, chest tightness,  and wheezing.   Cardiovascular: Denies chest pain, palpitations  Gastrointestinal: Denies  nausea, vomiting, abdominal pain, diarrhea, constipation, blood in stool and abdominal distention.  Genitourinary: Denies dysuria, urgency, frequency, hematuria, flank pain and difficulty urinating.  Skin: Denies pallor, rash and wound.  Neurological: Denies dizziness, seizures, syncope, weakness, light-headedness, numbness and headaches.    Objective:  Physical Exam: Filed Vitals:   01/01/12 1438  BP: 152/79  Pulse: 69  Temp: 97 F (36.1 C)  TempSrc: Oral  Height: 5\' 8"  (1.727 m)  Weight: 153 lb 11.2 oz (69.718 kg)   Constitutional: Vital signs reviewed.  Patient is a well-developed and well-nourished man in no acute distress and cooperative with exam. Alert and oriented x3.  Head: Normocephalic and atraumatic  Eyes: PERRL, EOMI, conjunctivae normal, No scleral icterus.  Neck: No JVD  Cardiovascular: RRR, S1 normal, S2 normal, no MRG, pulses symmetric and intact bilaterally Pulmonary/Chest: CTAB, no wheezes, rales, or rhonchi Abdominal: Soft. Non-tender, non-distended, bowel sounds are normal, no masses or guarding present.  Neurological: A&O x3, Strength is normal and symmetric bilaterally, cranial nerve II-XII are grossly intact, no focal motor deficit Skin: Warm, dry and intact. No rash, cyanosis, or clubbing.     Assessment & Plan:

## 2012-01-22 ENCOUNTER — Ambulatory Visit (INDEPENDENT_AMBULATORY_CARE_PROVIDER_SITE_OTHER): Payer: Medicare Other | Admitting: Internal Medicine

## 2012-01-22 VITALS — BP 140/80 | HR 71 | Temp 96.8°F | Ht 68.0 in | Wt 153.3 lb

## 2012-01-22 DIAGNOSIS — H6120 Impacted cerumen, unspecified ear: Secondary | ICD-10-CM | POA: Insufficient documentation

## 2012-01-22 DIAGNOSIS — F17200 Nicotine dependence, unspecified, uncomplicated: Secondary | ICD-10-CM

## 2012-01-22 DIAGNOSIS — D649 Anemia, unspecified: Secondary | ICD-10-CM

## 2012-01-22 MED ORDER — CARBAMIDE PEROXIDE 6.5 % OT SOLN: 5.0000 [drp] | Freq: Two times a day (BID) | OTIC | Status: AC

## 2012-01-22 NOTE — Patient Instructions (Signed)
Return to clinic in 1 month

## 2012-02-01 ENCOUNTER — Encounter: Payer: Self-pay | Admitting: Internal Medicine

## 2012-02-01 NOTE — Assessment & Plan Note (Signed)
Still FOBT positive based on recent test.  Hgb looked good.  Ferritin 15, increased from 6 on therapy since January.  He should continue PO iron and take his iron with 1/2 glass orange juice to increase absorption.  No gross blood in stools, no melena, no blood in vomit.  Since he has recent endoscopies, will continue to monitor with periodic FOBTs and CBCs.  Instructed him on signs of increased bleeding (blood in vomit or stools, dark vomit, melena).

## 2012-02-01 NOTE — Assessment & Plan Note (Signed)
Started Chantix last week.  Down to 2 cigarettes per day.  Discussed picking a quit day with him to quit entirely.  Follow-up cessation progress at next visit.

## 2012-02-01 NOTE — Progress Notes (Signed)
Subjective:   Patient ID: Troy Torres male   DOB: 1951-05-29 61 y.o.   MRN: [id number]  HPI: [name] is a 61 y.o. with history of ulcer/upper GI bleed who is here for follow-up.  We recently discussed smoking cessation and he is now down to 2 cigarettes per day, started Chantix last week.  No CP, no blood in stool or vomit, no vomiting, no melena.  No constipation or diarrhea.  Is doing well per his report.  Still abstaining from alcohol.  Has been taking his iron supplement.  Reports 2 weeks of L ear ringing.  No dizzyness.  No pain.  Some dullness of hearing in the ear at time.    Past Medical History  Diagnosis Date  . Seizures   . Head injury    Current Outpatient Prescriptions  Medication Sig Dispense Refill  . aspirin 81 MG tablet Take 81 mg by mouth daily.        . carbamide peroxide (DEBROX) 6.5 % otic solution Place 5 drops into both ears 2 (two) times daily. For 4 days.  Apply to one ear at a time and keep head tilted for 4 minutes each ear  15 mL  0  . ferrous sulfate 325 (65 FE) MG tablet Take 1 tablet (325 mg total) by mouth 3 (three) times daily with meals.  90 tablet  3  . omeprazole (PRILOSEC) 20 MG capsule Take two capsules (40mg ) by mouth once daily for one month, Then take one capsule (20mg ) by mouth once daily after that  60 capsule  6  . varenicline (CHANTIX CONTINUING MONTH PAK) 1 MG tablet Take 1 tablet (1 mg total) by mouth 2 (two) times daily.  60 tablet  1  . varenicline (CHANTIX STARTING MONTH PAK) 0.5 MG X 11 & 1 MG X 42 tablet Take one 0.5 mg pill by mouth once daily for 3 days, then take one 0.5 mg pill twice daily for 4 days, then take one 1 mg pill twice daily.  53 tablet  0   Family History  Problem Relation Age of Onset  . Diabetes Mother   . Diabetes Sister    History   Social History  . Marital Status: Legally Separated    Spouse Name: N/A    Number of Children: N/A  . Years of Education: N/A   Social History Main Topics  . Smoking  status: Current Everyday Smoker -- 0.2 packs/day    Types: Cigarettes  . Smokeless tobacco: None  . Alcohol Use: None  . Drug Use: None  . Sexually Active: None   Other Topics Concern  . None   Social History Narrative  . None   Review of Systems: Constitutional: Denies fever, chills, diaphoresis, appetite change and fatigue.  Respiratory: Denies SOB, DOE, cough, chest tightness,  and wheezing.   Cardiovascular: Denies chest pain, palpitations and leg swelling.  Gastrointestinal: Denies nausea, vomiting, abdominal pain, diarrhea, constipation, blood in stool and abdominal distention.  Genitourinary: Denies dysuria, urgency, frequency, hematuria, flank pain and difficulty urinating.  Musculoskeletal: Denies myalgias, back pain, joint swelling, arthralgias and gait problem.  Skin: Denies pallor, rash and wound.  Neurological: Denies dizziness, recent seizures, syncope, weakness, light-headedness, numbness and headaches.    Objective:  Physical Exam: Filed Vitals:   01/22/12 1335  BP: 140/80  Pulse: 71  Temp: 96.8 F (36 C)  TempSrc: Oral  Height: 5\' 8"  (1.727 m)  Weight: 153 lb 4.8 oz (69.536 kg)   Constitutional: Vital signs  reviewed.  Patient is a well-developed and well-nourished man in no acute distress and cooperative with exam. Alert and oriented x3.  Head: Normocephalic and atraumatic Ear: L ear canal is entirely clogged with ear wax, cannot visualize the ear drum.  R canal is partially obstructed and can visualize ~50% of tympanic membrane.   Mouth: no erythema or exudates, MMM Eyes: PERRL, EOMI, conjunctivae normal, No scleral icterus.  Neck:  No JVD  Cardiovascular: RRR, S1 normal, S2 normal, no MRG, pulses symmetric and intact bilaterally Pulmonary/Chest: CTAB, no wheezes, rales, or rhonchi Abdominal: Soft. Non-tender, non-distended, bowel sounds are normal, no masses, organomegaly, or guarding present.  Neurological: A&O x3, Strength is normal and symmetric  bilaterally, cranial nerve II-XII are grossly intact, no focal motor deficit   Assessment & Plan:

## 2012-02-01 NOTE — Assessment & Plan Note (Signed)
Likely cause of his L ear ringing.  Large amount of ear wax bilaterally, L> R, cannot see L ear drum as a result.  Prescribed Debrox.  Re-assess on follow-up.

## 2012-02-19 ENCOUNTER — Encounter: Payer: Medicare Other | Admitting: Internal Medicine

## 2012-03-20 ENCOUNTER — Inpatient Hospital Stay (HOSPITAL_COMMUNITY)
Admission: EM | Admit: 2012-03-20 | Discharge: 2012-03-22 | DRG: 379 | Disposition: A | Discharge status: Home / Self Care | Payer: Medicare Other | Attending: Infectious Disease | Admitting: Infectious Disease

## 2012-03-20 ENCOUNTER — Encounter (HOSPITAL_COMMUNITY): Payer: Self-pay | Admitting: Emergency Medicine

## 2012-03-20 ENCOUNTER — Emergency Department (HOSPITAL_COMMUNITY): Payer: Medicare Other

## 2012-03-20 DIAGNOSIS — K922 Gastrointestinal hemorrhage, unspecified: Secondary | ICD-10-CM

## 2012-03-20 DIAGNOSIS — Z79899 Other long term (current) drug therapy: Secondary | ICD-10-CM

## 2012-03-20 DIAGNOSIS — R259 Unspecified abnormal involuntary movements: Secondary | ICD-10-CM | POA: Diagnosis present

## 2012-03-20 DIAGNOSIS — Z23 Encounter for immunization: Secondary | ICD-10-CM

## 2012-03-20 DIAGNOSIS — Z7982 Long term (current) use of aspirin: Secondary | ICD-10-CM

## 2012-03-20 DIAGNOSIS — R112 Nausea with vomiting, unspecified: Secondary | ICD-10-CM | POA: Diagnosis present

## 2012-03-20 DIAGNOSIS — F1010 Alcohol abuse, uncomplicated: Secondary | ICD-10-CM | POA: Diagnosis present

## 2012-03-20 DIAGNOSIS — K5521 Angiodysplasia of colon with hemorrhage: Secondary | ICD-10-CM | POA: Diagnosis present

## 2012-03-20 DIAGNOSIS — K2921 Alcoholic gastritis with bleeding: Principal | ICD-10-CM | POA: Diagnosis present

## 2012-03-20 DIAGNOSIS — F17200 Nicotine dependence, unspecified, uncomplicated: Secondary | ICD-10-CM | POA: Diagnosis present

## 2012-03-20 LAB — COMPREHENSIVE METABOLIC PANEL
ALT: 18 U/L (ref 0–53)
AST: 37 U/L (ref 0–37)
Alkaline Phosphatase: 89 U/L (ref 39–117)
CO2: 19 mEq/L (ref 19–32)
Calcium: 9.7 mg/dL (ref 8.4–10.5)
GFR calc non Af Amer: >90 mL/min (ref >90)
Glucose, Bld: 116 mg/dL — ABNORMAL HIGH (ref 70–99)
Potassium: 4.2 mEq/L (ref 3.5–5.1)
Sodium: 139 mEq/L (ref 135–145)
Total Protein: 7.9 g/dL (ref 6.0–8.3)

## 2012-03-20 LAB — PROTIME-INR
INR: 0.95 (ref 0.00–1.49)
Prothrombin Time: 12.9 seconds (ref 11.6–15.2)

## 2012-03-20 LAB — CBC
Hemoglobin: 13.2 g/dL (ref 13.0–17.0)
MCH: 28.9 pg (ref 26.0–34.0)
Platelets: 163 10*3/uL (ref 150–400)
RBC: 4.57 MIL/uL (ref 4.22–5.81)

## 2012-03-20 LAB — GLUCOSE, CAPILLARY: Glucose-Capillary: 128 mg/dL — ABNORMAL HIGH (ref 70–99)

## 2012-03-20 LAB — ABO/RH: ABO/RH(D): B POS

## 2012-03-20 LAB — LACTIC ACID, PLASMA: Lactic Acid, Venous: 0.8 mmol/L (ref 0.5–2.2)

## 2012-03-20 MED ORDER — ONDANSETRON HCL 4 MG/2ML IJ SOLN: 4.0000 mg | Freq: Three times a day (TID) | INTRAMUSCULAR | Status: DC | PRN

## 2012-03-20 MED ORDER — ADULT MULTIVITAMIN W/MINERALS CH
1.0000 | ORAL_TABLET | Freq: Every day | ORAL | Status: DC
  Administered 2012-03-20 – 2012-03-22 (×3): 1 via ORAL
  Filled 2012-03-20 (×3): qty 1

## 2012-03-20 MED ORDER — ONDANSETRON HCL 4 MG/2ML IJ SOLN: 4.0000 mg | Freq: Four times a day (QID) | INTRAMUSCULAR | Status: DC | PRN

## 2012-03-20 MED ORDER — IOHEXOL 300 MG/ML  SOLN
100.0000 mL | Freq: Once | INTRAMUSCULAR | Status: AC | PRN
  Administered 2012-03-20: 100 mL via INTRAVENOUS

## 2012-03-20 MED ORDER — IBUPROFEN 200 MG PO TABS
600.0000 mg | ORAL_TABLET | Freq: Once | ORAL | Status: AC
Start: 2012-03-20 — End: 2012-03-20
  Administered 2012-03-20: 600 mg via ORAL
  Filled 2012-03-20: qty 3

## 2012-03-20 MED ORDER — THIAMINE HCL 100 MG/ML IJ SOLN
100.0000 mg | Freq: Every day | INTRAMUSCULAR | Status: DC
  Filled 2012-03-20 (×3): qty 1

## 2012-03-20 MED ORDER — SODIUM CHLORIDE 0.9 % IV SOLN: INTRAVENOUS | Status: DC

## 2012-03-20 MED ORDER — SODIUM CHLORIDE 0.9 % IV SOLN
INTRAVENOUS | Status: DC
  Administered 2012-03-20 – 2012-03-21 (×2): 1000 mL via INTRAVENOUS
  Administered 2012-03-21: 03:00:00 via INTRAVENOUS
  Administered 2012-03-22: 1000 mL via INTRAVENOUS

## 2012-03-20 MED ORDER — LORAZEPAM 2 MG/ML IJ SOLN
1.0000 mg | Freq: Four times a day (QID) | INTRAMUSCULAR | Status: DC | PRN
  Administered 2012-03-21: 1 mg via INTRAVENOUS
  Filled 2012-03-20: qty 1

## 2012-03-20 MED ORDER — SODIUM CHLORIDE 0.9 % IV SOLN
80.0000 mg | Freq: Two times a day (BID) | INTRAVENOUS | Status: DC
  Administered 2012-03-21: 80 mg via INTRAVENOUS
  Filled 2012-03-20 (×3): qty 80

## 2012-03-20 MED ORDER — LORAZEPAM 1 MG PO TABS
1.0000 mg | ORAL_TABLET | Freq: Four times a day (QID) | ORAL | Status: DC | PRN
  Administered 2012-03-20 – 2012-03-21 (×2): 1 mg via ORAL
  Filled 2012-03-20 (×2): qty 1

## 2012-03-20 MED ORDER — SODIUM CHLORIDE 0.9 % IV SOLN
80.0000 mg | Freq: Once | INTRAVENOUS | Status: AC
  Administered 2012-03-20: 80 mg via INTRAVENOUS
  Filled 2012-03-20: qty 80

## 2012-03-20 MED ORDER — FOLIC ACID 1 MG PO TABS
1.0000 mg | ORAL_TABLET | Freq: Every day | ORAL | Status: DC
  Administered 2012-03-20 – 2012-03-22 (×3): 1 mg via ORAL
  Filled 2012-03-20 (×3): qty 1

## 2012-03-20 MED ORDER — VITAMIN B-1 100 MG PO TABS
100.0000 mg | ORAL_TABLET | Freq: Every day | ORAL | Status: DC
  Administered 2012-03-20 – 2012-03-22 (×3): 100 mg via ORAL
  Filled 2012-03-20 (×3): qty 1

## 2012-03-20 MED ORDER — LORAZEPAM 2 MG/ML IJ SOLN
1.0000 mg | Freq: Once | INTRAMUSCULAR | Status: AC
  Administered 2012-03-20: 1 mg via INTRAVENOUS
  Filled 2012-03-20: qty 1

## 2012-03-20 MED ORDER — NICOTINE 21 MG/24HR TD PT24
21.0000 mg | MEDICATED_PATCH | TRANSDERMAL | Status: DC
  Administered 2012-03-20 – 2012-03-21 (×2): 21 mg via TRANSDERMAL
  Filled 2012-03-20 (×3): qty 1

## 2012-03-20 MED ORDER — SODIUM CHLORIDE 0.9 % IV BOLUS (SEPSIS)
1000.0000 mL | Freq: Once | INTRAVENOUS | Status: AC
  Administered 2012-03-20: 1000 mL via INTRAVENOUS

## 2012-03-20 NOTE — ED Provider Notes (Addendum)
History     CSN: [id number]  Arrival date & time 03/20/12  1323   First MD Initiated Contact with Patient 03/20/12 1339      Chief Complaint  Patient presents with  . Rectal Bleeding     The history is provided by the patient.   patient reports 3-4 days of nausea and vomiting.  He reports his been passing bright red blood from his rectum for the past 12 hours.  He reports this is occurred several times.  He does report an upper endoscopy 6 months ago that showed a gastric ulcer.  At that time he was admitted for an upper GI bleed per the patient.  Is not taking anticoagulants.  He denies alcohol abuse.  He reports he occasionally drinks vodka mixed with water and ice.  He reports a bottle of vodka usually last him a month.  He denies recent use of alcohol. Symptoms are moderate to severe  Past Medical History  Diagnosis Date  . Seizures   . Head injury   . Anemia   . GI bleed   . History of stomach ulcers     History reviewed. No pertinent past surgical history.  Family History  Problem Relation Age of Onset  . Diabetes Mother   . Diabetes Sister     History  Substance Use Topics  . Smoking status: Current Everyday Smoker -- 0.2 packs/day    Types: Cigarettes  . Smokeless tobacco: Not on file  . Alcohol Use: No      Review of Systems  Gastrointestinal: Positive for hematochezia.  All other systems reviewed and are negative.    Allergies  Review of patient's allergies indicates no known allergies.  Home Medications   Current Outpatient Rx  Name Route Sig Dispense Refill  . ASPIRIN 81 MG PO TABS Oral Take 81 mg by mouth daily.      . ADULT MULTIVITAMIN W/MINERALS CH Oral Take 1 tablet by mouth daily.      BP 125/67  Pulse 80  Temp 98.4 F (36.9 C) (Rectal)  Resp 20  SpO2 99%  Physical Exam  Nursing note and vitals reviewed. Constitutional: He is oriented to person, place, and time. He appears well-developed and well-nourished.       Tremulous    HENT:  Head: Normocephalic and atraumatic.  Eyes: EOM are normal.  Neck: Normal range of motion.  Cardiovascular: Regular rhythm, normal heart sounds and intact distal pulses.        Tachycardia  Pulmonary/Chest: Effort normal and breath sounds normal. No respiratory distress.  Abdominal: Soft. He exhibits no distension. There is no tenderness.  Genitourinary:       Dark brown stool, heme positive.  No gross blood  Musculoskeletal: Normal range of motion.  Neurological: He is alert and oriented to person, place, and time.  Skin: Skin is warm and dry.  Psychiatric: He has a normal mood and affect. Judgment normal.    ED Course  Procedures (including critical care time)  Labs Reviewed  CBC - Abnormal; Notable for the following:    RDW 15.6 (*)     All other components within normal limits  COMPREHENSIVE METABOLIC PANEL - Abnormal; Notable for the following:    Chloride 94 (*)     Glucose, Bld 116 (*)     All other components within normal limits  LIPASE, BLOOD - Abnormal; Notable for the following:    Lipase 70 (*)     All other components within normal  limits  ETHANOL - Abnormal; Notable for the following:    Alcohol, Ethyl (B) 21 (*)     All other components within normal limits  TYPE AND SCREEN  OCCULT BLOOD, POC DEVICE  ABO/RH   Ct Abdomen Pelvis W Contrast  03/20/2012  *RADIOLOGY REPORT*  Clinical Data: Rectal bleeding with nausea and vomiting.  History of GI bleeding from ulcers.  CT ABDOMEN AND PELVIS WITH CONTRAST  Technique:  Multidetector CT imaging of the abdomen and pelvis was performed following the standard protocol during bolus administration of intravenous contrast.  Contrast: OMNIPAQUE IOHEXOL 300 MG/ML  SOLN  Comparison: None.  Findings: The lung bases are clear and there is no pleural effusion.  The liver, gallbladder, biliary system and pancreas appear normal. The spleen appears normal.  There is low density prominence of the left adrenal gland,  measuring 14 HU on the delayed phase images, most consistent with an incidental adenoma. The right adrenal gland is minimally prominent.  The left kidney appears normal.  There are small low-density lesions in the upper pole of the right kidney, measuring 13 mm on image 24 and 11 mm on image 26.  These are most consistent with small cysts.  There is no hydronephrosis.  The stomach and small bowel appear normal.  There is probable stool within the right colon.  No bowel wall thickening or focal mucosal lesion is identified.  There is no surrounding inflammatory change. There is no lymphadenopathy.  The appendix appears normal.  There is scattered mild atherosclerosis.  The prostate gland is mildly enlarged with central calcifications, likely dystrophic.    The urinary bladder and seminal vesicles appear normal. There are no acute osseous findings.  IMPRESSION:  1.  No acute abdominal pelvic findings.  No explanation for GI bleeding identified. 2.  Probable small incidental right renal cysts and left adrenal adenoma. 3.  Central prostatic calcifications, likely dystrophic.  Original Report Authenticated By: Gerrianne Scale, M.D.     1. Upper GI bleed   2. Nausea & vomiting       MDM  I suspect this is an upper GI bleed given his dark emesis is heme positive stool.  He has a history of gastric ulcer and gastritis.  The patient will be admitted for ongoing workup and serial CBCs.  I discussed the case with the resident the outpatient clinic team who will admit and agrees to transfer to [facility].  He was extremely tremulous when he presented and although he reports no alcohol use or abuse his alcohol level in emergency department is 21 he smells of alcohol.  I'm concerned that some of his tachycardia and tremulousness is alcohol withdrawal.  I think the patient would benefit from being on alcohol withdrawal protocol when he is hospitalized.  I think this needs to be worked up further as alcohol  abuse can also causes gastric ulcer and gastritis that was noted on upper endoscopy in January 2013        Lyanne Co, MD 03/20/12 1642   Date: 04/20/2012  Rate: 101 Rhythm:sinus tach  QRS Axis: normal  Intervals: normal  ST/T Wave abnormalities: normal  Conduction Disutrbances: none  Narrative Interpretation:   Old EKG Reviewed: No significant changes noted     Lyanne Co, MD 04/20/12 [phone]

## 2012-03-20 NOTE — ED Notes (Signed)
Pt presenting to ed with c/o rectal bleeding onset last pm. Pt with obvious tremors, diaphoretic and tachycardic upon arrival to room 17. Md [place] phoned to room to evaluate patient

## 2012-03-20 NOTE — ED Notes (Signed)
[facility] is at bedside to transport pt to [place]

## 2012-03-20 NOTE — ED Notes (Signed)
Report called to [employer]. [facility] called pt awaiting transport

## 2012-03-20 NOTE — ED Notes (Signed)
[id number]<AV> Expected date:03/20/12<BR> Expected time:<BR> Means of arrival:<BR> Comments:<BR> EMS 120 GC - gi bleed

## 2012-03-20 NOTE — H&P (Signed)
Hospital Admission Note Date: 03/20/2012  Patient name: Troy Torres Medical record number: [id number] Date of birth: 01/02/1951 Age: 61 y.o. Gender: male PCP: Blanca Friend, MD  Medical Service: Internal Medicine Teaching Service - [facility]  Attending physician: Dr. Daiva Eves    1st Contact: Dr. Dorise Hiss   Pager: [phone] 2nd Contact: Dr. Dorthula Rue   Pager: [phone] After 5 pm or weekends: 1st Contact:      Pager: [phone] 2nd Contact:      Pager: [phone]  Chief Complaint: Tremors, Vomiting/rectal bleeding  History of Present Illness: Patient is a 61 yo man with history significant for GI bleed & stomach ulcers who presents with 3 days of hematemesis & BRBPR.  He reports initially feeling sick with decreased appetite 4d PTA, but first episode of BRBPR was 3 evenings PTA, shortly followed by his first episode of hematemesis.  Symptoms are similar to when he experienced a GIB 6 months PTA, during which hospitalization he was transfused 2u PRBCs.  He reports that symptoms persisted, and he totaled 2 episodes each of hematemesis & BRBPR 3 days PTA. He described stool as hard, and notes recent constipation.  Described blood to be on toilet tissue & in toilet bowl. 2 days PTA, he reports no stool, but 3 episodes of hematemesis.  He has been unable to tolerate anything by mouth, including ice water, but does feel hungry at present.  His last episode of hematemesis was earlier on the day of admission in the ED.  He denies dizziness, but reports some trouble with balance.  He called EMS to be transported to the ED because of increased tremors.  He denies abdominal pain or feeling nauseated.  He denies EtOH or NSAID use.  Meds: Current Outpatient Rx   Name  Route  Sig   .  ASPIRIN 81 MG PO TABS  Oral  Take 81 mg by mouth daily.   .  ADULT MULTIVITAMIN W/MINERALS CH  Oral  Take 1 tablet by mouth daily.     Allergies: Allergies as of 03/20/2012  . (No Known Allergies)   Past Medical History  Diagnosis  Date  . Seizures   . Head injury     s/p fall in shower  . Anemia   . GI bleed   . History of stomach ulcers    History reviewed. No pertinent past surgical history.  Family History  Problem Relation Age of Onset  . Diabetes Mother     deceased  . Diabetes Sister   . Heart attack Father     deceased  . Heart disease Sister 79   History   Social History  . Marital Status: Legally Separated    Spouse Name: N/A    Number of Children: N/A  . Years of Education: N/A   Occupational History  . Not on file.   Social History Main Topics  . Smoking status: Current Everyday Smoker -- 0.2 packs/day    Types: Cigarettes  . Smokeless tobacco: Not on file  . Alcohol Use: No  . Drug Use: No  . Sexually Active: Not on file   Other Topics Concern  . Not on file   Social History Narrative   Collects social security/disability.  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    Review of Systems: General: no chills, changes in weight; decrease in appetite, reports fever of 102 on morning of admission Skin: no rash HEENT: no blurry vision, hearing changes, sore throat  Pulm: no dyspnea, coughing, wheezing CV: no chest pain, palpitations, shortness of breath Abd: as per HPI GU: no dysuria, hematuria, polyuria Ext: no arthralgias, myalgias Neuro: no weakness, numbness, or tingling   Physical Exam: Blood pressure 155/80, pulse 84, temperature 97.9 F (36.6 C), temperature source Oral, resp. rate 20, height 5\' 8"  (1.727 m), weight 143 lb 15.4 oz (65.3 kg), SpO2 98.00%. RA General: resting in bed, no acute distress, cooperative to exam HEENT: PERRL, EOMI, no scleral icterus, no conjunctival pallor Cardiac: RRR, distant heart sounds, no rubs, murmurs or gallops Pulm: clear to auscultation bilaterally, moving normal volumes of air Abd: soft, nondistended, BS normoactive, mildly tender to RLQ Ext: warm and well perfused, no pedal edema Neuro: alert and  oriented X3, cranial nerves II-XII grossly intact, extremities tremulous    Lab results: Basic Metabolic Panel:  Basename 03/20/12 1350  NA 139  K 4.2  CL 94*  CO2 19  GLUCOSE 116*  BUN 16  CREATININE 0.79  CALCIUM 9.7  MG --  PHOS --   Liver Function Tests:  Basename 03/20/12 1350  AST 37  ALT 18  ALKPHOS 89  BILITOT 0.6  PROT 7.9  ALBUMIN 4.3    Basename 03/20/12 1350  LIPASE 70*  AMYLASE --   CBC:  Basename 03/20/12 1350  WBC 8.3  NEUTROABS --  HGB 13.2  HCT 39.0  MCV 85.3  PLT 163   CBG:  Basename 03/20/12 1340  GLUCAP 128*    Alcohol Level:  Basename 03/20/12 1350  ETH 21*   FOBT +   Imaging results:  Ct Abdomen Pelvis W Contrast  03/20/2012  *RADIOLOGY REPORT*  Clinical Data: Rectal bleeding with nausea and vomiting.  History of GI bleeding from ulcers.  CT ABDOMEN AND PELVIS WITH CONTRAST  Technique:  Multidetector CT imaging of the abdomen and pelvis was performed following the standard protocol during bolus administration of intravenous contrast.  Contrast: OMNIPAQUE IOHEXOL 300 MG/ML  SOLN  Comparison: None.  Findings: The lung bases are clear and there is no pleural effusion.  The liver, gallbladder, biliary system and pancreas appear normal. The spleen appears normal.  There is low density prominence of the left adrenal gland, measuring 14 HU on the delayed phase images, most consistent with an incidental adenoma. The right adrenal gland is minimally prominent.  The left kidney appears normal.  There are small low-density lesions in the upper pole of the right kidney, measuring 13 mm on image 24 and 11 mm on image 26.  These are most consistent with small cysts.  There is no hydronephrosis.  The stomach and small bowel appear normal.  There is probable stool within the right colon.  No bowel wall thickening or focal mucosal lesion is identified.  There is no surrounding inflammatory change. There is no lymphadenopathy.  The appendix appears  normal.  There is scattered mild atherosclerosis.  The prostate gland is mildly enlarged with central calcifications, likely dystrophic.    The urinary bladder and seminal vesicles appear normal. There are no acute osseous findings.  IMPRESSION:  1.  No acute abdominal pelvic findings.  No explanation for GI bleeding identified. 2.  Probable small incidental right renal cysts and left adrenal adenoma. 3.  Central prostatic calcifications, likely dystrophic.  Original Report Authenticated By: Gerrianne Scale, M.D.    Other results: WUJ:WJXBJ tachy, irergular, HR = 101, QTc=472, Q waves in V1-V2, similar from old EKG  Assessment & Plan by Problem: 61 yo man with  h/o GIB and stomach ulcers p/w  #Hematemesis: Most likely related to gastritis/PUD or esophagitis in setting of elevated EtOH level.  Patient underwent EGD on 10/13/11 that revealed Grade B esophagitis, a superficial pre pyloric gastric ulcer, and mild gastritis with h pylori negative biopsy.  Patient's exam does not suggest acute abdomen d/t ulcer perforation.    -Admit to regular floor  -Clear liquid diet if tolerated, then NPO after midnight - already discussed with GI, who will see patient in the morning  -Acid suppression with protonix  -Zofran to manage nausea  -HIV antibody to further consider other infectious etiology  -Monitor CBC with AML, and sooner if patient becomes symptomatic   -Orthostatic vital signs  #Rectal Bleeding: Most likely d/t upper GIB.  Though patient reports BRBPR, ED physician noted dark stools.  Patient also reports constipation, so the BRBPR that he noticed may have been d/t anal fissure vs hemorrhoid.  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  -Follow plan as above  -Once patient taking PO without problems, consider addition of stool softener/high fiber diet to treat  constipation  #Elevated Anion Gap: Most likely d/t EtOH ketoacidosis.  Patient is not diabetic or uremic.    -Obtain urine osm to identify possibility of other ingestions  -Lactic acid, though less likely given patient is perfusing well and no apparent acute abdomen on physical exam or CT that   might suggest dead bowel.  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  -CIWA obs  -Consider social work consult  #Elevated Blood Pressure: Patient does not have history of hypertension., though clinic notes suggest mild HTN.  Continue monitoring during hospitalization, but at this point would not initiate therapy in setting of acute illness.  #VTE ppx: SCDs  Signed: Vernice [facility] 03/20/2012, 8:10 PM

## 2012-03-21 DIAGNOSIS — K922 Gastrointestinal hemorrhage, unspecified: Secondary | ICD-10-CM

## 2012-03-21 LAB — CBC
HCT: 34.5 % — ABNORMAL LOW (ref 39.0–52.0)
Hemoglobin: 11.6 g/dL — ABNORMAL LOW (ref 13.0–17.0)
RDW: 15.5 % (ref 11.5–15.5)
WBC: 6.6 10*3/uL (ref 4.0–10.5)

## 2012-03-21 LAB — BASIC METABOLIC PANEL
GFR calc non Af Amer: >90 mL/min (ref >90)
Glucose, Bld: 70 mg/dL (ref 70–99)
Potassium: 3.8 mEq/L (ref 3.5–5.1)
Sodium: 138 mEq/L (ref 135–145)

## 2012-03-21 LAB — OSMOLALITY, URINE: Osmolality, Ur: 767 mOsm/kg (ref 390–1090)

## 2012-03-21 LAB — TYPE AND SCREEN

## 2012-03-21 LAB — HIV ANTIBODY (ROUTINE TESTING W REFLEX): HIV: NONREACTIVE

## 2012-03-21 MED ORDER — PNEUMOCOCCAL VAC POLYVALENT 25 MCG/0.5ML IJ INJ
0.5000 mL | INJECTION | INTRAMUSCULAR | Status: AC
  Administered 2012-03-22: 0.5 mL via INTRAMUSCULAR
  Filled 2012-03-21: qty 0.5

## 2012-03-21 MED ORDER — PANTOPRAZOLE SODIUM 40 MG IV SOLR
40.0000 mg | Freq: Two times a day (BID) | INTRAVENOUS | Status: DC
  Administered 2012-03-21: 40 mg via INTRAVENOUS
  Filled 2012-03-21 (×4): qty 40

## 2012-03-21 NOTE — Progress Notes (Signed)
Subjective: The patient was seen this morning and he wants to know when he can eat. He states that he has had no further bleeding from his rectum or vomiting of blood since arrival to hospital. He is not having any nausea or vomiting. He is not feeling dizzy or lightheaded. No complaints or needs at this time. Does not wish to discuss alcohol use as he is too hungry.   Objective: Vital signs in last 24 hours: Filed Vitals:   03/20/12 2202 03/21/12 0000 03/21/12 0600 03/21/12 1004  BP: 134/82 126/78 132/74 123/70  Pulse: 100 88 80 65  Temp: 98.9 F (37.2 C) 98.2 F (36.8 C) 98.2 F (36.8 C) 97 F (36.1 C)  TempSrc: Oral Oral Oral Tympanic  Resp: 20 20 20 20   Height:      Weight:      SpO2: 98% 97% 98% 99%   Weight change:  No intake or output data in the 24 hours ending 03/21/12 1106 General: resting in bed HEENT: PERRL, EOMI, no scleral icterus Cardiac: RRR, no rubs, murmurs or gallops Pulm: clear to auscultation bilaterally, moving normal volumes of air Abd: soft, mildly tender, nondistended, BS present Ext: warm and well perfused, no pedal edema Neuro: alert and oriented X3, cranial nerves II-XII grossly intact  Lab Results: Basic Metabolic Panel:  Lab 03/21/12 1610 03/20/12 1350  NA 138 139  K 3.8 4.2  CL 101 94*  CO2 25 19  GLUCOSE 70 116*  BUN 15 16  CREATININE 0.78 0.79  CALCIUM 8.4 9.7  MG -- --  PHOS -- --   Liver Function Tests:  Lab 03/20/12 1350  AST 37  ALT 18  ALKPHOS 89  BILITOT 0.6  PROT 7.9  ALBUMIN 4.3    Lab 03/20/12 1350  LIPASE 70*  AMYLASE --   CBC:  Lab 03/21/12 0630 03/20/12 1350  WBC 6.6 8.3  NEUTROABS -- --  HGB 11.6* 13.2  HCT 34.5* 39.0  MCV 85.2 85.3  PLT 124* 163  CBG:  Lab 03/20/12 1340  GLUCAP 128*   Coagulation:  Lab 03/20/12 2126  LABPROT 12.9  INR 0.95   Alcohol Level:  Lab 03/20/12 1350  ETH 21*   Studies/Results: Ct Abdomen Pelvis W Contrast  03/20/2012  *RADIOLOGY REPORT*  Clinical Data: Rectal  bleeding with nausea and vomiting.  History of GI bleeding from ulcers.  CT ABDOMEN AND PELVIS WITH CONTRAST  Technique:  Multidetector CT imaging of the abdomen and pelvis was performed following the standard protocol during bolus administration of intravenous contrast.  Contrast: OMNIPAQUE IOHEXOL 300 MG/ML  SOLN  Comparison: None.  Findings: The lung bases are clear and there is no pleural effusion.  The liver, gallbladder, biliary system and pancreas appear normal. The spleen appears normal.  There is low density prominence of the left adrenal gland, measuring 14 HU on the delayed phase images, most consistent with an incidental adenoma. The right adrenal gland is minimally prominent.  The left kidney appears normal.  There are small low-density lesions in the upper pole of the right kidney, measuring 13 mm on image 24 and 11 mm on image 26.  These are most consistent with small cysts.  There is no hydronephrosis.  The stomach and small bowel appear normal.  There is probable stool within the right colon.  No bowel wall thickening or focal mucosal lesion is identified.  There is no surrounding inflammatory change. There is no lymphadenopathy.  The appendix appears normal.  There is  scattered mild atherosclerosis.  The prostate gland is mildly enlarged with central calcifications, likely dystrophic.    The urinary bladder and seminal vesicles appear normal. There are no acute osseous findings.  IMPRESSION:  1.  No acute abdominal pelvic findings.  No explanation for GI bleeding identified. 2.  Probable small incidental right renal cysts and left adrenal adenoma. 3.  Central prostatic calcifications, likely dystrophic.  Original Report Authenticated By: Gerrianne Scale, M.D.   Medications: I have reviewed the patient's current medications. Scheduled Meds:   . folic acid  1 mg Oral Daily  . ibuprofen  600 mg Oral Once  . LORazepam  1 mg Intravenous Once  . multivitamin with minerals  1 tablet Oral  Daily  . nicotine  21 mg Transdermal Q24H  . pantoprazole (PROTONIX) IV  80 mg Intravenous Once  . pantoprazole (PROTONIX) IV  40 mg Intravenous Q12H  . pneumococcal 23 valent vaccine  0.5 mL Intramuscular Tomorrow-1000  . sodium chloride  1,000 mL Intravenous Once  . thiamine  100 mg Oral Daily   Or  . thiamine  100 mg Intravenous Daily  . DISCONTD: sodium chloride   Intravenous STAT  . DISCONTD: pantoprazole (PROTONIX) IV  80 mg Intravenous Q12H   Continuous Infusions:   . 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 GI did evaluate this morning and would not like to EGD or colonoscopy and will advance his diet as tolerated today and monitor CBCs for signs of active bleeding with IV PPIs for 24 hours then switch to oral BID. Pt was not taking PPI at home. Ct abdomen and pelvis without acute findings responsible for the bleeding.   AG - Likely was secondary to alcohol ketoacidosis and is resolved this morning.   Alcohol abuse - CIWA and counseling. Pt does not seem excited to discuss this with [place].   Disposition - The patient will be monitored for bleeding during stay.    LOS: 1 day   Genella Mech 03/21/2012, 11:06 AM

## 2012-03-21 NOTE — Consult Note (Signed)
[facility] Consultation Note  Referring Provider: Acey Lav, MD Primary Care Physician:  Blanca Friend, MD  Reason for Consultation:  Hematemesis, blood in stool  HPI: Troy Torres is a 61 y.o. male admitted for hematemesis and blood in stool.  Starting few days ago, patient had acute onset of hematemesis (coffee grounds and red material) without preceding vomiting.  Also has endorsed some red blood in stool.  He tells me he had several episodes of each.  He has mild lower abdominal pain.  Upon admission, his Hgb was normal and has not had any further bleeding; ED exam reportedly showed dark hemoccult-positive stool.  He drinks alcohol 1-2 per month per his report (alcohol level positive in ED).  Takes ASA 81 mg/day but denies any other NSAIDs.  Has chronic GERD, but does not take any medication for this.  Denies dysphagia.  Had endoscopy and colonoscopy in January 2013 by Dr. Madilyn Fireman for anemia and occult blood in stool; endoscopy showed LA-B esophagitis and small antral ulcer; colonoscopy (fair prep) showed cecal AVMs otherwise normal (no mention of hemorrhoids or AVMs).  Appetite has slowly worsened; lost about 5 lbs over the past several months.     Past Medical History  Diagnosis Date  . Seizures   . Head injury     s/p fall in shower  . Anemia   . GI bleed   . History of stomach ulcers     History reviewed. No pertinent past surgical history.  Prior to Admission medications   Medication Sig Start Date End Date Taking? Authorizing Provider  aspirin 81 MG tablet Take 81 mg by mouth daily.     Yes Historical Provider, MD  Multiple Vitamin (MULTIVITAMIN WITH MINERALS) TABS Take 1 tablet by mouth daily.   Yes Historical Provider, MD    Current Facility-Administered Medications  Medication Dose Route Frequency Provider Last Rate Last Dose  . 0.9 %  sodium chloride infusion   Intravenous Continuous Sunday Spillers, MD 150 mL/hr at 03/21/12 0234    . folic acid (FOLVITE)  tablet 1 mg  1 mg Oral Daily Sunday Spillers, MD   1 mg at 03/20/12 2136  . ibuprofen (ADVIL,MOTRIN) tablet 600 mg  600 mg Oral Once Lyanne Co, MD   600 mg at 03/20/12 1400  . iohexol (OMNIPAQUE) 300 MG/ML solution 100 mL  100 mL Intravenous Once PRN Medication Radiologist, MD   100 mL at 03/20/12 1533  . LORazepam (ATIVAN) injection 1 mg  1 mg Intravenous Once Lyanne Co, MD   1 mg at 03/20/12 1400  . LORazepam (ATIVAN) tablet 1 mg  1 mg Oral Q6H PRN Sunday Spillers, MD   1 mg at 03/20/12 2144   Or  . LORazepam (ATIVAN) injection 1 mg  1 mg Intravenous Q6H PRN Sunday Spillers, MD      . multivitamin with minerals tablet 1 tablet  1 tablet Oral Daily Sunday Spillers, MD   1 tablet at 03/20/12 2137  . nicotine (NICODERM CQ - dosed in mg/24 hours) patch 21 mg  21 mg Transdermal Q24H Sunday Spillers, MD   21 mg at 03/20/12 2135  . ondansetron (ZOFRAN) injection 4 mg  4 mg Intravenous Q6H PRN Sunday Spillers, MD      . pantoprazole (PROTONIX) 80 mg in sodium chloride 0.9 % 100 mL IVPB  80 mg Intravenous Once Lyanne Co, MD   80 mg at 03/20/12 1712  . pantoprazole (PROTONIX)  80 mg in sodium chloride 0.9 % 100 mL IVPB  80 mg Intravenous Q12H Sunday Spillers, MD      . pneumococcal 23 valent vaccine (PNU-IMMUNE) injection 0.5 mL  0.5 mL Intramuscular Tomorrow-1000 Randall Hiss, MD      . sodium chloride 0.9 % bolus 1,000 mL  1,000 mL Intravenous Once Lyanne Co, MD   1,000 mL at 03/20/12 1400  . thiamine (VITAMIN B-1) tablet 100 mg  100 mg Oral Daily Sunday Spillers, MD   100 mg at 03/20/12 2137   Or  . thiamine (B-1) injection 100 mg  100 mg Intravenous Daily Sunday Spillers, MD      . DISCONTD: 0.9 %  sodium chloride infusion   Intravenous STAT Lyanne Co, MD      . DISCONTD: ondansetron [facility]) injection 4 mg  4 mg Intravenous Q8H PRN Lyanne Co, MD        Allergies as of 03/20/2012  . (No Known Allergies)    Family History  Problem Relation Age of Onset  . Diabetes Mother     deceased    . Diabetes Sister   . Heart attack Father     deceased  . Heart disease Sister 34    History   Social History  . Marital Status: Legally Separated    Spouse Name: N/A    Number of Children: N/A  . Years of Education: N/A   Occupational History  . Not on file.   Social History Main Topics  . Smoking status: Current Everyday Smoker -- 0.2 packs/day    Types: Cigarettes  . Smokeless tobacco: Not on file  . Alcohol Use: No  . Drug Use: No  . Sexually Active: Not on file   Other Topics Concern  . Not on file   Social History Narrative   Collects social security/disability.  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    Review of Systems: Positive = bold Gen: Denies any fever, chills, rigors, night sweats, anorexia, fatigue, weakness, malaise, involuntary weight loss, and sleep disorder CV: Denies chest pain, angina, palpitations, syncope, orthopnea, PND, peripheral edema, and claudication. Resp: Denies dyspnea, cough, sputum, wheezing, coughing up blood. GI: Described in detail in HPI.    GU : Denies urinary burning, blood in urine, urinary frequency, urinary hesitancy, nocturnal urination, and urinary incontinence. MS: Denies joint pain or swelling.  Denies muscle weakness, cramps, atrophy.  Derm: Denies rash, itching, oral ulcerations, hives, unhealing ulcers.  Psych: Denies depression, anxiety, memory loss, suicidal ideation, hallucinations,  and confusion. Heme: Denies bruising, bleeding, and enlarged lymph nodes. Neuro:  Denies any headaches, dizziness, paresthesias. Endo:  Denies any problems with DM, thyroid, adrenal function.  Physical Exam: Vital signs in last 24 hours: Temp:  [97 F (36.1 C)-99.4 F (37.4 C)] 97 F (36.1 C) (06/20 1004) Pulse Rate:  [65-129] 65  (06/20 1004) Resp:  [20-25] 20  (06/20 1004) BP: (123-155)/(67-88) 123/70 mmHg (06/20 1004) SpO2:  [97 %-100 %] 99 % (06/20 1004) Weight:  [65.3 kg (143 lb 15.4 oz)]  65.3 kg (143 lb 15.4 oz) (06/19 1848) Last BM Date: 03/20/12 General:   Alert,  Chronically cachectic and chronically ill-appearing, but is in no acute distress Head:  Normocephalic and atraumatic. Eyes:  Sclera clear, no icterus.   Conjunctiva pink. Ears:  Normal auditory acuity. Nose:  No deformity, discharge,  or lesions. Mouth:  No deformity or lesions.  Oropharynx slightly dry, otherwise pink & moist. Lungs:  Clear throughout to auscultation.   No wheezes, crackles, or rhonchi. No acute distress. Heart:  Regular rate and rhythm; no murmurs, clicks, rubs,  or gallops. Abdomen:  Soft, non-distended, mild generalized tenderness (decreases with distraction maneuvers). No masses, hepatosplenomegaly or hernias noted. Normal bowel sounds, without guarding, and without rebound.     Msk:  Diffuse muscular atrophy; otherwise symmetrical without gross deformities. Normal posture. Pulses:  Normal pulses noted. Extremities:  Without clubbing or edema. Neurologic:  Somewhat tremulous; Alert and  oriented x4;  otherwise grossly normal neurologically. Skin:  Intact without significant lesions or rashes. Psych:  Alert and cooperative. Normal mood and affect.   Lab Results:  Basename 03/21/12 0630 03/20/12 1350  WBC 6.6 8.3  HGB 11.6* 13.2  HCT 34.5* 39.0  PLT 124* 163   BMET  Basename 03/21/12 0630 03/20/12 1350  NA 138 139  K 3.8 4.2  CL 101 94*  CO2 25 19  GLUCOSE 70 116*  BUN 15 16  CREATININE 0.78 0.79  CALCIUM 8.4 9.7   LFT  Basename 03/20/12 1350  PROT 7.9  ALBUMIN 4.3  AST 37  ALT 18  ALKPHOS 89  BILITOT 0.6  BILIDIR --  IBILI --   PT/INR  Basename 03/20/12 2126  LABPROT 12.9  INR 0.95    Studies/Results: Ct Abdomen Pelvis W Contrast  03/20/2012  *RADIOLOGY REPORT*  Clinical Data: Rectal bleeding with nausea and vomiting.  History of GI bleeding from ulcers.  CT ABDOMEN AND PELVIS WITH CONTRAST  Technique:  Multidetector CT imaging of the abdomen and pelvis was  performed following the standard protocol during bolus administration of intravenous contrast.  Contrast: OMNIPAQUE IOHEXOL 300 MG/ML  SOLN  Comparison: None.  Findings: The lung bases are clear and there is no pleural effusion.  The liver, gallbladder, biliary system and pancreas appear normal. The spleen appears normal.  There is low density prominence of the left adrenal gland, measuring 14 HU on the delayed phase images, most consistent with an incidental adenoma. The right adrenal gland is minimally prominent.  The left kidney appears normal.  There are small low-density lesions in the upper pole of the right kidney, measuring 13 mm on image 24 and 11 mm on image 26.  These are most consistent with small cysts.  There is no hydronephrosis.  The stomach and small bowel appear normal.  There is probable stool within the right colon.  No bowel wall thickening or focal mucosal lesion is identified.  There is no surrounding inflammatory change. There is no lymphadenopathy.  The appendix appears normal.  There is scattered mild atherosclerosis.  The prostate gland is mildly enlarged with central calcifications, likely dystrophic.    The urinary bladder and seminal vesicles appear normal. There are no acute osseous findings.  IMPRESSION:  1.  No acute abdominal pelvic findings.  No explanation for GI bleeding identified. 2.  Probable small incidental right renal cysts and left adrenal adenoma. 3.  Central prostatic calcifications, likely dystrophic.  Original Report Authenticated By: Gerrianne Scale, M.D.    Impression:  1.  Hematemesis and hematochezia by patient report.  No further bleeding upon admission.  Hgb normal on admission.  Recent endoscopy and colonoscopy as above for similar reasons.  Overall, suspect esophagitis and possible ulcer as contributing factors. 2.  Tremulousness and subtle agitation.  Findings not inconsistent with incipient alcohol withdrawal, but patient denies significant  alcohol consumption.  Plan:  1.  Advance diet. 2.  PPI IV x 24 hours, then transition to po bid x 8 weeks. 3.  Would not do endoscopy or colonoscopy at this point.  Do not plan on endoscopic evaluation unless/until patient displays overt signs of bleeding while hospitalized. 4.  Will follow.  Thank you for the consult.   LOS: 1 day   Daliah Chaudoin M  03/21/2012, 10:20 AM

## 2012-03-21 NOTE — Care Management Note (Signed)
    Page 1 of 1   03/21/2012     3:02:18 PM   CARE MANAGEMENT NOTE 03/21/2012  Patient:  Troy Torres, Troy Torres   Account Number:  [id number]  Date Initiated:  03/21/2012  Documentation initiated by:  Onnie Boer  Subjective/Objective Assessment:   PT WAS ADMITTED WITH GI BLD AND ETOH W/D     Action/Plan:   PROGRESSION OF CARE AND DISCHARGE PLANNING   Anticipated DC Date:  03/23/2012   Anticipated DC Plan:  HOME/SELF CARE      DC Planning Services  CM consult      Choice offered to / List presented to:             Status of service:  In process, will continue to follow Medicare Important Message given?   (If response is "NO", the following Medicare IM given date fields will be blank) Date Medicare IM given:   Date Additional Medicare IM given:    Discharge Disposition:    Per UR Regulation:  Reviewed for med. necessity/level of care/duration of stay  If discussed at Long Length of Stay Meetings, dates discussed:    Comments:  03/21/12 Onnie Boer, RN, BSN 1459 PT WAS ADMITTED WITH GI BLD.  PT HAVING W/U .  WILL F/U ON DC NEEDS

## 2012-03-21 NOTE — H&P (Signed)
Internal Medicine Teaching Service Attending Note Date: 03/21/2012  Patient name: Troy Torres  Medical record number: [id number]  Date of birth: 02/17/1951    This patient has been seen and discussed with the house staff. Please see their note for complete details. I concur with their findings with the following additions/corrections:  Patient with what sounds more like an UG bleed, more likely minimal lower gi bleed from hemorrhoids, though he did have angiodysplastic lesions on colonoscopy.   Continue PPI, await formal GI consult. Acey Lav 03/21/2012, 10:17 AM

## 2012-03-22 LAB — CBC
Hemoglobin: 12.3 g/dL — ABNORMAL LOW (ref 13.0–17.0)
MCHC: 34.7 g/dL (ref 30.0–36.0)
Platelets: 100 10*3/uL — ABNORMAL LOW (ref 150–400)
RDW: 15.1 % (ref 11.5–15.5)

## 2012-03-22 MED ORDER — PANTOPRAZOLE SODIUM 40 MG PO TBEC: 40.0000 mg | DELAYED_RELEASE_TABLET | Freq: Two times a day (BID) | ORAL | Status: DC

## 2012-03-22 MED ORDER — ONDANSETRON HCL 4 MG/2ML IJ SOLN: 4.0000 mg | Freq: Four times a day (QID) | INTRAMUSCULAR | Status: DC | PRN

## 2012-03-22 MED ORDER — ONDANSETRON HCL 4 MG PO TABS: 4.0000 mg | ORAL_TABLET | Freq: Four times a day (QID) | ORAL | Status: DC | PRN

## 2012-03-22 MED ORDER — PANTOPRAZOLE SODIUM 40 MG PO TBEC
40.0000 mg | DELAYED_RELEASE_TABLET | Freq: Two times a day (BID) | ORAL | Status: DC
  Administered 2012-03-22: 40 mg via ORAL
  Filled 2012-03-22: qty 1

## 2012-03-22 NOTE — Discharge Summary (Signed)
Internal Medicine Teaching [facility] Discharge Note  Name: Troy Torres MRN: [id number] DOB: 10/29/1950 61 y.o.  Date of Admission: 03/20/2012  1:27 PM Date of Discharge: 03/22/2012 Attending Physician: Randall Hiss, MD  Discharge Diagnosis: Active Problems:  Nausea & vomiting Upper GI bleed  Discharge Medications: Medication List  As of 03/22/2012 12:05 PM   STOP taking these medications         aspirin 81 MG tablet         TAKE these medications         multivitamin with minerals Tabs   Take 1 tablet by mouth daily.      pantoprazole 40 MG tablet   Commonly known as: PROTONIX   Take 1 tablet (40 mg total) by mouth 2 (two) times daily before a meal.            Disposition and follow-up:   Mr.Philippe Torres was discharged from [facility] in Stable condition.  At the hospital follow up visit please address alcohol cessation and whether he has had any more blood in stool or vomitus.   Follow-up Appointments: Follow-up Information    Follow up with Lorretta Harp, MD on 04/03/2012. (Appointment at 9:15 am)    Contact information:   [address] [place] [address] [phone]         Discharge Orders    Future Appointments: Provider: Department: Dept Phone: Center:   04/03/2012 9:15 AM Lorretta Harp, MD [facility] [phone] [facility]     Future Orders Please Complete By Expires   Diet - low sodium heart healthy      Increase activity slowly      Call MD for:      Comments:   Bleeding or blood in any stool or throw up.      Consultations: Treatment Team:  Florencia Reasons, MD  Procedures Performed:  Ct Abdomen Pelvis W Contrast  03/20/2012  *RADIOLOGY REPORT*  Clinical Data: Rectal bleeding with nausea and vomiting.  History of GI bleeding from ulcers.  CT ABDOMEN AND PELVIS WITH CONTRAST  Technique:  Multidetector CT imaging of the abdomen and pelvis was performed following the standard protocol during bolus  administration of intravenous contrast.  Contrast: OMNIPAQUE IOHEXOL 300 MG/ML  SOLN  Comparison: None.  Findings: The lung bases are clear and there is no pleural effusion.  The liver, gallbladder, biliary system and pancreas appear normal. The spleen appears normal.  There is low density prominence of the left adrenal gland, measuring 14 HU on the delayed phase images, most consistent with an incidental adenoma. The right adrenal gland is minimally prominent.  The left kidney appears normal.  There are small low-density lesions in the upper pole of the right kidney, measuring 13 mm on image 24 and 11 mm on image 26.  These are most consistent with small cysts.  There is no hydronephrosis.  The stomach and small bowel appear normal.  There is probable stool within the right colon.  No bowel wall thickening or focal mucosal lesion is identified.  There is no surrounding inflammatory change. There is no lymphadenopathy.  The appendix appears normal.  There is scattered mild atherosclerosis.  The prostate gland is mildly enlarged with central calcifications, likely dystrophic.    The urinary bladder and seminal vesicles appear normal. There are no acute osseous findings.  IMPRESSION:  1.  No acute abdominal pelvic findings.  No explanation for GI bleeding  identified. 2.  Probable small incidental right renal cysts and left adrenal adenoma. 3.  Central prostatic calcifications, likely dystrophic.  Original Report Authenticated By: Gerrianne Scale, M.D.   Admission HPI:  Patient is a 61 yo man with history significant for GI bleed & stomach ulcers who presents with 3 days of hematemesis & BRBPR. He reports initially feeling sick with decreased appetite 4d PTA, but first episode of BRBPR was 3 evenings PTA, shortly followed by his first episode of hematemesis. Symptoms are similar to when he experienced a GIB 6 months PTA, during which hospitalization he was transfused 2u PRBCs. He reports that symptoms  persisted, and he totaled 2 episodes each of hematemesis & BRBPR 3 days PTA. He described stool as hard, and notes recent constipation. Described blood to be on toilet tissue & in toilet bowl. 2 days PTA, he reports no stool, but 3 episodes of hematemesis. He has been unable to tolerate anything by mouth, including ice water, but does feel hungry at present. His last episode of hematemesis was earlier on the day of admission in the ED. He denies dizziness, but reports some trouble with balance. He called EMS to be transported to the ED because of increased tremors. He denies abdominal pain or feeling nauseated. He denies EtOH or NSAID use.  Hospital Course by problem list:  Hematemasis - The patient did come in with some blood in his vomit and some blood streaked in his stool. He was given fluids and kept NPO for possible GI procedure and GI was called. His hemoglobin levels were stable at that time and he was no longer vomiting blood in the hospital. GI saw the following morning and did not want to do a procedure so diet was advanced slowly over 1 day with IV PPI for likely upper GI bleed. 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 He did continue to have stable hemoglobin levels in the hospital and was advanced to regular diet and no recurrence of the blood per stool or per vomitus. He was then stable for discharge with instructions to call doctor or seek medical attention if he has recurrence of blood per vomitus or rectum. Changed to PPI oral BID and discharged with this medicine.   Alcohol abuse - The patient was kept on CIWA to monitor for signs of withdrawal here and was not in withdrawal during this stay. Did talk to him about limiting or abstaining from alcohol to avoid irritation to his stomach lining.    Discharge Vitals:  BP 148/83  Pulse 64  Temp 98.1 F (36.7 C) (Oral)  Resp 18  Ht 5\' 8"  (1.727 m)  Wt 143 lb 15.4 oz (65.3 kg)  BMI 21.89 kg/m2  SpO2  98%  Discharge Labs:  Results for orders placed during the hospital encounter of 03/20/12 (from the past 24 hour(s))  CBC     Status: Abnormal   Collection Time   03/22/12 10:47 AM      Component Value Range   WBC 7.0  4.0 - 10.5 K/uL   RBC 4.19 (*) 4.22 - 5.81 MIL/uL   Hemoglobin 12.3 (*) 13.0 - 17.0 g/dL   HCT 16.1 (*) 09.6 - 04.5 %   MCV 84.5  78.0 - 100.0 fL   MCH 29.4  26.0 - 34.0 pg   MCHC 34.7  30.0 - 36.0 g/dL   RDW 40.9  81.1 - 91.4 %   Platelets 100 (*) 150 - 400 K/uL  SignedGenella Mech 03/22/2012, 12:05 PM   Time Spent on Discharge: 20 minutes

## 2012-03-22 NOTE — Progress Notes (Signed)
Discharge instructions given and reviewed with patient. Patient pulled out iv. Patient discharged home, he walked out out to meet his ride. He did not want to wait for wheelchair or for ride to get here.

## 2012-03-22 NOTE — Progress Notes (Signed)
Internal Medicine Teaching Service Attending Note Date: 03/22/2012  Patient name: Troy Torres  Medical record number: [id number]  Date of birth: 04/01/1951    This patient has been seen and discussed with the house staff. Please see their note for complete details. I concur with their findings with the following additions/corrections:  Greatly appreciate GI help here. Dr. Matthias Hughs recommends daily oTC omeprazole and I think this is completely reasonable and affordable option. We have counselled the patient to not use aspirin or NSAIDS and to abstain from ETOh.  Acey Lav 03/22/2012, 11:34 AM

## 2012-03-22 NOTE — Discharge Instructions (Signed)
You were seen today for some blood in your vomit. We would like you to stop aspirin and start taking protonix twice a day. If you have further bleeding please call us or seek medical attention. Please come back for your follow up appointment in the clinic. Please try to avoid or limit alcohol intake as well as this can irritate your stomach.

## 2012-03-22 NOTE — Progress Notes (Signed)
Subjective: The patient was seen this morning and wants to have regular diet as he is only on full liquids at this time. He has not vomited since arriving. If his blood counts are stable he would like to go home today. Switching to PO protonix BID today.    Objective: Vital signs in last 24 hours: Filed Vitals:   03/21/12 1803 03/21/12 2157 03/22/12 0124 03/22/12 0556  BP: 125/73 153/79 115/59 148/83  Pulse: 68 60 63 64  Temp: 98.2 F (36.8 C) 98.4 F (36.9 C) 98.4 F (36.9 C) 98.1 F (36.7 C)  TempSrc: Oral Oral Oral Oral  Resp: 18 20 20 18   Height:      Weight:      SpO2: 98% 99% 98% 98%   Weight change:   Intake/Output Summary (Last 24 hours) at 03/22/12 0841 Last data filed at 03/22/12 0700  Gross per 24 hour  Intake   2460 ml  Output    725 ml  Net   1735 ml   General: resting in bed HEENT: PERRL, EOMI, no scleral icterus Cardiac: RRR, no rubs, murmurs or gallops Pulm: clear to auscultation bilaterally, moving normal volumes of air Abd: soft, mildly tender, nondistended, BS present Ext: warm and well perfused, no pedal edema Neuro: alert and oriented X3, cranial nerves II-XII grossly intact  Lab Results: Basic Metabolic Panel:  Lab 03/21/12 1610 03/20/12 1350  NA 138 139  K 3.8 4.2  CL 101 94*  CO2 25 19  GLUCOSE 70 116*  BUN 15 16  CREATININE 0.78 0.79  CALCIUM 8.4 9.7  MG -- --  PHOS -- --   Liver Function Tests:  Lab 03/20/12 1350  AST 37  ALT 18  ALKPHOS 89  BILITOT 0.6  PROT 7.9  ALBUMIN 4.3    Lab 03/20/12 1350  LIPASE 70*  AMYLASE --   CBC:  Lab 03/21/12 0630 03/20/12 1350  WBC 6.6 8.3  NEUTROABS -- --  HGB 11.6* 13.2  HCT 34.5* 39.0  MCV 85.2 85.3  PLT 124* 163  CBG:  Lab 03/20/12 1340  GLUCAP 128*   Coagulation:  Lab 03/20/12 2126  LABPROT 12.9  INR 0.95   Alcohol Level:  Lab 03/20/12 1350  ETH 21*   Studies/Results: Ct Abdomen Pelvis W Contrast  03/20/2012  *RADIOLOGY REPORT*  Clinical Data: Rectal bleeding  with nausea and vomiting.  History of GI bleeding from ulcers.  CT ABDOMEN AND PELVIS WITH CONTRAST  Technique:  Multidetector CT imaging of the abdomen and pelvis was performed following the standard protocol during bolus administration of intravenous contrast.  Contrast: OMNIPAQUE IOHEXOL 300 MG/ML  SOLN  Comparison: None.  Findings: The lung bases are clear and there is no pleural effusion.  The liver, gallbladder, biliary system and pancreas appear normal. The spleen appears normal.  There is low density prominence of the left adrenal gland, measuring 14 HU on the delayed phase images, most consistent with an incidental adenoma. The right adrenal gland is minimally prominent.  The left kidney appears normal.  There are small low-density lesions in the upper pole of the right kidney, measuring 13 mm on image 24 and 11 mm on image 26.  These are most consistent with small cysts.  There is no hydronephrosis.  The stomach and small bowel appear normal.  There is probable stool within the right colon.  No bowel wall thickening or focal mucosal lesion is identified.  There is no surrounding inflammatory change. There is no lymphadenopathy.  The appendix appears normal.  There is scattered mild atherosclerosis.  The prostate gland is mildly enlarged with central calcifications, likely dystrophic.    The urinary bladder and seminal vesicles appear normal. There are no acute osseous findings.  IMPRESSION:  1.  No acute abdominal pelvic findings.  No explanation for GI bleeding identified. 2.  Probable small incidental right renal cysts and left adrenal adenoma. 3.  Central prostatic calcifications, likely dystrophic.  Original Report Authenticated By: Gerrianne Scale, M.D.   Medications: I have reviewed the patient's current medications. Scheduled Meds:    . folic acid  1 mg Oral Daily  . multivitamin with minerals  1 tablet Oral Daily  . nicotine  21 mg Transdermal Q24H  . pantoprazole  40 mg Oral BID  AC  . pneumococcal 23 valent vaccine  0.5 mL Intramuscular Tomorrow-1000  . thiamine  100 mg Oral Daily   Or  . thiamine  100 mg Intravenous Daily  . DISCONTD: pantoprazole (PROTONIX) IV  80 mg Intravenous Q12H  . DISCONTD: pantoprazole (PROTONIX) IV  40 mg Intravenous Q12H   Continuous Infusions:    . 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 GI did evaluate and would not like to EGD or colonoscopy. Regular diet today. Oral PPI BID. Pt was not taking PPI at home. Ct abdomen and pelvis without acute findings responsible for the bleeding.   AG - Likely was secondary to alcohol ketoacidosis and is resolved.   Alcohol abuse - CIWA and counseling. Pt does not seem excited to discuss this with [place].   Disposition - CBC this am pending, if stable D/C today.   LOS: 2 days   Genella Mech 03/22/2012, 8:41 AM

## 2012-03-22 NOTE — Progress Notes (Signed)
Minimal drop in hemoglobin overnight, as would be expected with IV hydration. Felt nauseated last night, but no vomiting. Tolerated full breakfast this morning. No bowel movements.  Impression: No clinically significant bleeding evident.  Recommendation:   1.  I feel, and the patient agrees, that no GI workup is needed. I have recommended, to be on the safe side, that he use daily PPI therapy for the next month or so. He could use over-the-counter generic omeprazole, which would be low cost.  2. I think discharge from the GI tract standpoint is appropriate.  3. I would recommend followup in the outpatient clinic to make sure that there are no lingering issues, such as persistent heme positivity, and to assess whether the patient finds that he needs ongoing PPI therapy to control GI symptoms..  I will plan to sign off at this time, but feel free to call me if you have any questions, and I would be happy to see the patient again at your request.  Florencia Reasons, M.D. [phone]

## 2012-04-03 ENCOUNTER — Encounter: Payer: Medicare Other | Admitting: Internal Medicine

## 2012-04-09 ENCOUNTER — Ambulatory Visit (INDEPENDENT_AMBULATORY_CARE_PROVIDER_SITE_OTHER): Payer: Medicare Other | Admitting: Internal Medicine

## 2012-04-09 ENCOUNTER — Encounter: Payer: Self-pay | Admitting: Internal Medicine

## 2012-04-09 VITALS — BP 114/70 | HR 56 | Temp 97.2°F | Ht 68.0 in | Wt 144.9 lb

## 2012-04-09 DIAGNOSIS — K922 Gastrointestinal hemorrhage, unspecified: Secondary | ICD-10-CM

## 2012-04-09 DIAGNOSIS — D649 Anemia, unspecified: Secondary | ICD-10-CM

## 2012-04-09 DIAGNOSIS — K5900 Constipation, unspecified: Secondary | ICD-10-CM

## 2012-04-09 DIAGNOSIS — H6120 Impacted cerumen, unspecified ear: Secondary | ICD-10-CM

## 2012-04-09 MED ORDER — FERROUS SULFATE 325 (65 FE) MG PO TABS: 325.0000 mg | ORAL_TABLET | Freq: Every day | ORAL | Status: DC

## 2012-04-09 MED ORDER — DOCUSATE SODIUM 100 MG PO CAPS: 100.0000 mg | ORAL_CAPSULE | Freq: Every day | ORAL | Status: DC | PRN

## 2012-04-09 MED ORDER — FOLIC ACID 1 MG PO TABS: 1.0000 mg | ORAL_TABLET | Freq: Every day | ORAL | Status: DC

## 2012-04-09 NOTE — Patient Instructions (Signed)
1. Please take all medications as prescribed.  2. If you have worsening of your symptoms or new symptoms arise, please call the clinic ([phone]), or go to the ER immediately if symptoms are severe. 

## 2012-04-09 NOTE — Assessment & Plan Note (Signed)
Patient has iron deficiency anemia secondary to history GI bleeding. His hemoglobin has been stable. He supposed to take iron supplement, folic acid, multiple vitamin and PPI. Because of constipation, patient stopped iron supplement. Will treat his constipation with colace. Patient is instructed to continue his iron supplement, folic acid and PPI.

## 2012-04-09 NOTE — Progress Notes (Signed)
Patient ID: Troy Torres, male   DOB: 10/08/1950, 61 y.o.   MRN: [id number]   Subjective:   Patient ID: Troy Torres male   DOB: 04-Jun-1951 61 y.o.   MRN: [id number]  HPI: [name] is a 61 y.o. with a past medical history as outlined below, who presents for a hospital followup visit for in his recent hospitalization.  Patient was recently hospitalized from 06/19 to 6/21 due to GI bleeding. Patient had a history of GI bleed and gastric ulcer in the past. 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 Patient had 3 days of hematemesis and BRBPR PTA. During his hospitalization, GI was consulted and did not do any procedures. Patient was treated with IV PPI for possible upper GI bleeding. His Hgb was stable (13.2 on admission). Patient was discharged at stable condition. Patient was discharged on iron supplement, folate acid, and multivitamin and Protonix. Due to constipation, patient stopped taking her iron supplements. Today patient feels good. He didn't have a new episode of GI bleeding after he went home.    Patient reports having ringing in her left ear, which has been going on for more than 2 months. Patient does not have any symptoms for vertigo, such as dizziness or feeling room spinning around him. Patient was given prescription of Ear Wax Remover in the past which did not help significantly.   Past Medical History  Diagnosis Date  . Seizures   . Head injury     s/p fall in shower  . Anemia   . GI bleed   . History of stomach ulcers      Current Outpatient Prescriptions  Medication Sig Dispense Refill  . Multiple Vitamin (MULTIVITAMIN WITH MINERALS) TABS Take 1 tablet by mouth daily.      . pantoprazole (PROTONIX) 40 MG tablet Take 1 tablet (40 mg total) by mouth 2 (two) times daily before a meal.  60 tablet  1   Family History  Problem Relation Age of Onset  . Diabetes Mother     deceased  . Diabetes Sister   . Heart  attack Father     deceased  . Heart disease Sister 102   History   Social History  . Marital Status: Legally Separated    Spouse Name: N/A    Number of Children: N/A  . Years of Education: N/A   Social History Main Topics  . Smoking status: Current Everyday Smoker -- 0.2 packs/day    Types: Cigarettes  . Smokeless tobacco: None  . Alcohol Use: No  . Drug Use: No  . Sexually Active: None   Other Topics Concern  . None   Social History Narrative   Collects social security/disability.  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   Review of Systems:  General: no fevers, chills, no changes in body weight, no changes in appetite Skin: no rash HEENT: no blurry vision, hearing changes or sore throat. Has right ear ringing.  Pulm: no dyspnea, coughing, wheezing CV: no chest pain, palpitations, shortness of breath Abd: no nausea/vomiting, abdominal pain, diarrhea/constipation GU: no dysuria, hematuria, polyuria Ext: no arthralgias, myalgias Neuro: no weakness, numbness, or tingling   Objective:  Physical Exam: Filed Vitals:   04/09/12 1441  BP: 114/70  Pulse: 56  Temp: 97.2 F (36.2 C)  TempSrc: Oral  Height: 5\' 8"  (1.727 m)  Weight: 144 lb 14.4 oz (65.726 kg)  SpO2: 99%    General: resting in bed, not in acute distress HEENT: PERRL, EOMI, no scleral icterus. left ear canal is clogged with ear wax, cannot visualize the ear drum. R canal is partially obstructed and can visualize ~50% of tympanic membrane.  Cardiac: S1/S2, RRR, No murmurs, gallops or rubs Pulm: Good air movement bilaterally, Clear to auscultation bilaterally, No rales, wheezing, rhonchi or rubs. Abd: Soft,  nondistended, nontender, no rebound pain, no organomegaly, BS present Ext: No rashes or edema, 2+DP/PT pulse bilaterally Musculoskeletal: No joint deformities, erythema, or stiffness, ROM full and no nontender Skin: no rashes. No skin bruise. Neuro: alert and oriented X3,  cranial nerves II-XII grossly intact, muscle strength 5/5 in all extremeties,  sensation to light touch intact.  Psych.: patient is not psychotic, no suicidal or hemocidal ideation.   Assessment & Plan:

## 2012-04-09 NOTE — Assessment & Plan Note (Signed)
Patient's GI bleeding has resolved after discharge. He didn't have a new episode of hematemesis or bloody stool. He does not have symptoms for anemia currently. Will continue to treat patient with PPI for possible gastritis or gastric ulcer. Will treat his iron deficiency anemia with iron supplements. Will followup.

## 2012-04-09 NOTE — Assessment & Plan Note (Signed)
His ear ringing is most likely caused by large amount of ear wax. Dr. Aundria Rud cleaned it up partially for him. Patient was instructed to continue with his Ear Wax Remover. Will follow up.

## 2012-10-02 DIAGNOSIS — K922 Gastrointestinal hemorrhage, unspecified: Secondary | ICD-10-CM

## 2012-10-02 HISTORY — DX: Gastrointestinal hemorrhage, unspecified: K92.2

## 2013-01-03 ENCOUNTER — Emergency Department (HOSPITAL_COMMUNITY): Payer: Medicare Other

## 2013-01-03 ENCOUNTER — Emergency Department (HOSPITAL_COMMUNITY)
Admission: EM | Admit: 2013-01-03 | Discharge: 2013-01-03 | Disposition: A | Discharge status: Home / Self Care | Payer: Medicare Other | Attending: Emergency Medicine | Admitting: Emergency Medicine

## 2013-01-03 DIAGNOSIS — Z8711 Personal history of peptic ulcer disease: Secondary | ICD-10-CM | POA: Insufficient documentation

## 2013-01-03 DIAGNOSIS — Z87820 Personal history of traumatic brain injury: Secondary | ICD-10-CM | POA: Insufficient documentation

## 2013-01-03 DIAGNOSIS — R55 Syncope and collapse: Secondary | ICD-10-CM

## 2013-01-03 DIAGNOSIS — D649 Anemia, unspecified: Secondary | ICD-10-CM | POA: Insufficient documentation

## 2013-01-03 DIAGNOSIS — Z79899 Other long term (current) drug therapy: Secondary | ICD-10-CM | POA: Insufficient documentation

## 2013-01-03 DIAGNOSIS — Z8659 Personal history of other mental and behavioral disorders: Secondary | ICD-10-CM | POA: Insufficient documentation

## 2013-01-03 DIAGNOSIS — K922 Gastrointestinal hemorrhage, unspecified: Secondary | ICD-10-CM | POA: Insufficient documentation

## 2013-01-03 DIAGNOSIS — F17200 Nicotine dependence, unspecified, uncomplicated: Secondary | ICD-10-CM | POA: Insufficient documentation

## 2013-01-03 DIAGNOSIS — Z8669 Personal history of other diseases of the nervous system and sense organs: Secondary | ICD-10-CM | POA: Insufficient documentation

## 2013-01-03 LAB — POCT I-STAT, CHEM 8
Creatinine, Ser: 1.4 mg/dL — ABNORMAL HIGH (ref 0.50–1.35)
Glucose, Bld: 104 mg/dL — ABNORMAL HIGH (ref 70–99)
Hemoglobin: 12.9 g/dL — ABNORMAL LOW (ref 13.0–17.0)
Potassium: 4.7 mEq/L (ref 3.5–5.1)

## 2013-01-03 LAB — CBC WITH DIFFERENTIAL/PLATELET
Basophils Relative: 1 % (ref 0–1)
HCT: 36.4 % — ABNORMAL LOW (ref 39.0–52.0)
Hemoglobin: 12.9 g/dL — ABNORMAL LOW (ref 13.0–17.0)
MCH: 29.3 pg (ref 26.0–34.0)
MCHC: 35.4 g/dL (ref 30.0–36.0)
MCV: 82.7 fL (ref 78.0–100.0)
Monocytes Absolute: 0.3 10*3/uL (ref 0.1–1.0)
Monocytes Relative: 8 % (ref 3–12)
Neutro Abs: 1.1 10*3/uL — ABNORMAL LOW (ref 1.7–7.7)

## 2013-01-03 LAB — GLUCOSE, CAPILLARY

## 2013-01-03 MED ORDER — SODIUM CHLORIDE 0.9 % IV BOLUS (SEPSIS)
1000.0000 mL | Freq: Once | INTRAVENOUS | Status: AC
  Administered 2013-01-03: 1000 mL via INTRAVENOUS

## 2013-01-03 NOTE — ED Provider Notes (Signed)
History     CSN: [id number]  Arrival date & time 01/03/13  1141   First MD Initiated Contact with Patient 01/03/13 1144      No chief complaint on file.   (Consider location/radiation/quality/duration/timing/severity/associated sxs/prior treatment) HPI  62 year old male with history of anemia secondary to GI bleed, seizure presents for evaluations of fall. Patient reports he has a history of bipolar and this morning he had the urge to walk around the neighborhood. States he was walking for several blocks and felt lightheadedness, subsequently fell down to the ground. There was no loss of consciousness at that time. He got up, walked to the restaurant and order some food. After eating he walks another additional 2-3 blocks when he became lightheadedness, had a syncopal episode and fell down to the ground. Several people witness this fall, was able to help him get up and called EMS. Patient only complaint is mild tenderness to the back of his head and some skin tear to his left elbow. He denies vision changes, nausea, vomiting, diarrhea, chest pain or shortness of breath, back pain, abdominal pain, numbness or weakness. He denies any sensation of dizziness. He has been drinking as usual. He denies hematochezia or melena. He denies any recent alcohol use. He denies any medication changes. He denies confusion after the fall, tongue biting, or urinary incontinence.  Past Medical History  Diagnosis Date  . Seizures   . Head injury     s/p fall in shower  . Anemia   . GI bleed   . History of stomach ulcers     No past surgical history on file.  Family History  Problem Relation Age of Onset  . Diabetes Mother     deceased  . Diabetes Sister   . Heart attack Father     deceased  . Heart disease Sister 39    History  Substance Use Topics  . Smoking status: Current Every Day Smoker -- 0.20 packs/day    Types: Cigarettes  . Smokeless tobacco: Not on file  . Alcohol Use: No       Review of Systems  Constitutional:       A complete 10 system review of systems was obtained and all systems are negative except as noted in the HPI and PMH.    Allergies  Review of patient's allergies indicates no known allergies.  Home Medications   Current Outpatient Rx  Name  Route  Sig  Dispense  Refill  . docusate sodium (COLACE) 100 MG capsule   Oral   Take 1 capsule (100 mg total) by mouth daily as needed for constipation.   30 capsule   3   . ferrous sulfate 325 (65 FE) MG tablet   Oral   Take 1 tablet (325 mg total) by mouth daily.   30 tablet   3   . folic acid (FOLVITE) 1 MG tablet   Oral   Take 1 tablet (1 mg total) by mouth daily.   100 tablet   3   . Multiple Vitamin (MULTIVITAMIN WITH MINERALS) TABS   Oral   Take 1 tablet by mouth daily.         . pantoprazole (PROTONIX) 40 MG tablet   Oral   Take 1 tablet (40 mg total) by mouth 2 (two) times daily before a meal.   60 tablet   1     There were no vitals taken for this visit.  Physical Exam  Nursing note and vitals reviewed.  Constitutional: He is oriented to person, place, and time. He appears well-developed and well-nourished. No distress.  Awake, alert, nontoxic appearance  HENT:  Head: Atraumatic.  Mild tenderness to occpital region of scalp without overlying skin changes or deformity noted.  Eyes: Conjunctivae are normal. Right eye exhibits no discharge. Left eye exhibits no discharge.  Neck: Normal range of motion. Neck supple.  Cardiovascular: Normal rate and regular rhythm.   Pulmonary/Chest: Effort normal. No respiratory distress. He exhibits no tenderness.  Abdominal: Soft. There is no tenderness. There is no rebound.  Musculoskeletal: He exhibits no edema and no tenderness.  ROM appears intact, no obvious focal weakness  Neurological: He is alert and oriented to person, place, and time.  Skin: Skin is warm and dry. No rash noted.  Small skin tear to L elbow, no  deformity, minimal pain. FROM  Psychiatric: He has a normal mood and affect.    ED Course  Procedures (including critical care time)   Date: 01/03/2013  Rate: 77  Rhythm: normal sinus rhythm  QRS Axis: normal  Intervals: normal  ST/T Wave abnormalities: nonspecific ST/T changes  Conduction Disutrbances:none  Narrative Interpretation:   Old EKG Reviewed: unchanged    Patient presents with syncopal episode and 2 subsequent fall since earlier today. Workup initiated.  1:59 PM Patient is back to his normal baseline. Head CT shows no evidence of intracranial trauma or any other acute finding. Patient maintained normal orthostasis. He has a normal CBG. Hemoglobin is 12.9. Fecal occult blood test is positive however no evidence of frank blood or melena on my exam. Electrolytes are within normal limits. EKG shows no acute changes.  2:05 PM Since pt has recurrent fall and syncopized i suggest for admission for further evaluation.  Pt however request to be discharge.  I explain the risk of discharge including syncope, worsening bleeding, even cardiac death.  Pt aware of risk and agrees to return if he has any concerns.  Care discussed with my attending.  Will have pt discharge.  He's able to ambulate without difficulty.  Is mentating well.     Labs Reviewed  CBC WITH DIFFERENTIAL - Abnormal; Notable for the following:    WBC 3.1 (*)    Hemoglobin 12.9 (*)    HCT 36.4 (*)    Neutrophils Relative 35 (*)    Neutro Abs 1.1 (*)    Lymphocytes Relative 53 (*)    All other components within normal limits  OCCULT BLOOD, POC DEVICE - Abnormal; Notable for the following:    Fecal Occult Bld POSITIVE (*)    All other components within normal limits  POCT I-STAT, CHEM 8 - Abnormal; Notable for the following:    Creatinine, Ser 1.40 (*)    Glucose, Bld 104 (*)    Hemoglobin 12.9 (*)    HCT 38.0 (*)    All other components within normal limits  GLUCOSE, CAPILLARY  OCCULT BLOOD X 1 CARD TO LAB,  STOOL   Ct Head Wo Contrast  01/03/2013  *RADIOLOGY REPORT*  Clinical Data: Syncope with fall  CT HEAD WITHOUT CONTRAST  Technique:  Contiguous axial images were obtained from the base of the skull through the vertex without contrast.  Comparison: None.  Findings: There is bifrontal atrophy, encephalomalacia and gliosis consistent with old head trauma.  Lesser changes are also seen at the temporal tips.  No sign of acute infarction, mass lesion, hemorrhage, hydrocephalus or extra-axial collection.  No calvarial abnormality.  Sinuses, middle ears and mastoids are  clear.  IMPRESSION: Bifrontal atrophy, encephalomalacia and gliosis consistent with old head trauma.  Lesser changes at the temporal tips.  No acute finding.   Original Report Authenticated By: Paulina Fusi, M.D.      1. GI bleeding   2. Syncope and collapse       MDM  BP 111/64  Pulse 90  Temp(Src) 98.7 F (37.1 C) (Oral)  Resp 21  SpO2 94%  I have reviewed nursing notes and vital signs. I personally reviewed the imaging tests through PACS system  I reviewed available ER/hospitalization records thought the EMR         Fayrene Helper, [place] 01/03/13 1426

## 2013-01-03 NOTE — ED Notes (Signed)
Pt was outside, c/o fall x2 today. Pt c/o slight head pain, no signs of deformity, no sob,loc,cp

## 2013-01-04 ENCOUNTER — Emergency Department (HOSPITAL_COMMUNITY)
Admission: EM | Admit: 2013-01-04 | Discharge: 2013-01-04 | Disposition: A | Discharge status: Home / Self Care | Payer: Medicare Other | Attending: Emergency Medicine | Admitting: Emergency Medicine

## 2013-01-04 ENCOUNTER — Encounter (HOSPITAL_COMMUNITY): Payer: Self-pay | Admitting: Physical Medicine and Rehabilitation

## 2013-01-04 DIAGNOSIS — Z8719 Personal history of other diseases of the digestive system: Secondary | ICD-10-CM | POA: Insufficient documentation

## 2013-01-04 DIAGNOSIS — Z9181 History of falling: Secondary | ICD-10-CM | POA: Insufficient documentation

## 2013-01-04 DIAGNOSIS — Z87820 Personal history of traumatic brain injury: Secondary | ICD-10-CM | POA: Insufficient documentation

## 2013-01-04 DIAGNOSIS — Z8711 Personal history of peptic ulcer disease: Secondary | ICD-10-CM | POA: Insufficient documentation

## 2013-01-04 DIAGNOSIS — Z8669 Personal history of other diseases of the nervous system and sense organs: Secondary | ICD-10-CM | POA: Insufficient documentation

## 2013-01-04 DIAGNOSIS — R609 Edema, unspecified: Secondary | ICD-10-CM | POA: Insufficient documentation

## 2013-01-04 DIAGNOSIS — E860 Dehydration: Secondary | ICD-10-CM | POA: Insufficient documentation

## 2013-01-04 DIAGNOSIS — Z862 Personal history of diseases of the blood and blood-forming organs and certain disorders involving the immune mechanism: Secondary | ICD-10-CM | POA: Insufficient documentation

## 2013-01-04 DIAGNOSIS — R5383 Other fatigue: Secondary | ICD-10-CM | POA: Insufficient documentation

## 2013-01-04 DIAGNOSIS — F17200 Nicotine dependence, unspecified, uncomplicated: Secondary | ICD-10-CM | POA: Insufficient documentation

## 2013-01-04 DIAGNOSIS — R5381 Other malaise: Secondary | ICD-10-CM | POA: Insufficient documentation

## 2013-01-04 DIAGNOSIS — R42 Dizziness and giddiness: Secondary | ICD-10-CM | POA: Insufficient documentation

## 2013-01-04 LAB — CBC WITH DIFFERENTIAL/PLATELET
HCT: 37 % — ABNORMAL LOW (ref 39.0–52.0)
Hemoglobin: 13.2 g/dL (ref 13.0–17.0)
Lymphocytes Relative: 55 % — ABNORMAL HIGH (ref 12–46)
Lymphs Abs: 2.2 10*3/uL (ref 0.7–4.0)
MCHC: 35.7 g/dL (ref 30.0–36.0)
Monocytes Absolute: 0.4 10*3/uL (ref 0.1–1.0)
Monocytes Relative: 11 % (ref 3–12)
Neutro Abs: 1.2 10*3/uL — ABNORMAL LOW (ref 1.7–7.7)
WBC: 4 10*3/uL (ref 4.0–10.5)

## 2013-01-04 LAB — BASIC METABOLIC PANEL
BUN: 8 mg/dL (ref 6–23)
CO2: 27 mEq/L (ref 19–32)
Chloride: 106 mEq/L (ref 96–112)
Creatinine, Ser: 0.83 mg/dL (ref 0.50–1.35)
Glucose, Bld: 93 mg/dL (ref 70–99)

## 2013-01-04 MED ORDER — SODIUM CHLORIDE 0.9 % IV BOLUS (SEPSIS)
1000.0000 mL | Freq: Once | INTRAVENOUS | Status: AC
  Administered 2013-01-04: 1000 mL via INTRAVENOUS

## 2013-01-04 MED ORDER — THIAMINE HCL 100 MG/ML IJ SOLN
100.0000 mg | Freq: Once | INTRAMUSCULAR | Status: AC
  Administered 2013-01-04: 100 mg via INTRAVENOUS
  Filled 2013-01-04: qty 2

## 2013-01-04 NOTE — ED Provider Notes (Addendum)
History     CSN: [id number]  Arrival date & time 01/04/13  1345   First MD Initiated Contact with Patient 01/04/13 1415      Chief Complaint  Patient presents with  . Pain   history is obtained from patient and his brother (Consider location/radiation/quality/duration/timing/severity/associated sxs/prior treatment) HPI Patient presents with left hand swelling since yesterday at the site where he had an intravenous line. He was seen here yesterday for syncope. Left AGAINST MEDICAL ADVICE.Marland Kitchen associated symptoms include lightheadedness with standing. He denies pain anywhere. No bowel movement in 2 days. No other treatment prior to coming here. He admits to diminished appetite for several weeks to months.. no other associated symptoms. Past Medical History  Diagnosis Date  . Seizures   . Head injury     s/p fall in shower  . Anemia   . GI bleed   . History of stomach ulcers     No past surgical history on file.  Family History  Problem Relation Age of Onset  . Diabetes Mother     deceased  . Diabetes Sister   . Heart attack Father     deceased  . Heart disease Sister 6    History  Substance Use Topics  . Smoking status: Current Every Day Smoker -- 0.20 packs/day    Types: Cigarettes  . Smokeless tobacco: Not on file  . Alcohol Use: No   Denies alcohol use his brother states that he drinks heavily   Review of Systems  HENT: Negative.   Respiratory: Negative.   Cardiovascular: Negative.   Gastrointestinal: Negative.   Musculoskeletal: Negative.   Skin: Negative.   Neurological: Positive for weakness.       Generalized weakness lightheadedness with standing  Psychiatric/Behavioral: Negative.   All other systems reviewed and are negative.    Allergies  Review of patient's allergies indicates no known allergies.  Home Medications   Current Outpatient Rx  Name  Route  Sig  Dispense  Refill  . folic acid (FOLVITE) 400 MCG tablet   Oral   Take 400 mcg by  mouth daily.           BP 124/79  Pulse 74  Temp(Src) 97.1 F (36.2 C) (Oral)  Resp 18  Ht 5\' 11"  (1.803 m)  Wt 135 lb (61.236 kg)  BMI 18.84 kg/m2  SpO2 100%  Physical Exam  Nursing note and vitals reviewed. Constitutional: He is oriented to person, place, and time.  Chronically ill-appearing  HENT:  Head: Normocephalic and atraumatic.  Mucous membranes dry  Eyes: Conjunctivae are normal. Pupils are equal, round, and reactive to light.  Neck: Neck supple. No tracheal deviation present. No thyromegaly present.  Cardiovascular: Normal rate and regular rhythm.   No murmur heard. Pulmonary/Chest: Effort normal and breath sounds normal.  Abdominal: Soft. Bowel sounds are normal. He exhibits no distension. There is no tenderness.  Genitourinary: Rectum normal. Guaiac negative stool.  Stool Hemoccult negative quality controlled rectal normal tone brown stool nontender  Musculoskeletal: Normal range of motion. He exhibits edema. He exhibits no tenderness.  Left upper extremity Minimal swelling of dorsum of left wrist. No redness no warmth no tenderness full range of motion. Neurovascularly intact. All extremities are without redness swelling or tenderness neurovascularly intact  Neurological: He is alert and oriented to person, place, and time. Coordination normal.  Gait normal. Patient becomes lightheaded on standing  Skin: Skin is warm and dry. No rash noted.  Healing abrasion to left elbow  Psychiatric:  He has a normal mood and affect.    ED Course  Procedures (including critical care time)  Labs Reviewed  CBC WITH DIFFERENTIAL  BASIC METABOLIC PANEL   Ct Head Wo Contrast  01/03/2013  *RADIOLOGY REPORT*  Clinical Data: Syncope with fall  CT HEAD WITHOUT CONTRAST  Technique:  Contiguous axial images were obtained from the base of the skull through the vertex without contrast.  Comparison: None.  Findings: There is bifrontal atrophy, encephalomalacia and gliosis consistent  with old head trauma.  Lesser changes are also seen at the temporal tips.  No sign of acute infarction, mass lesion, hemorrhage, hydrocephalus or extra-axial collection.  No calvarial abnormality.  Sinuses, middle ears and mastoids are clear.  IMPRESSION: Bifrontal atrophy, encephalomalacia and gliosis consistent with old head trauma.  Lesser changes at the temporal tips.  No acute finding.   Original Report Authenticated By: Paulina Fusi, M.D.      No diagnosis found.  Date: 01/04/2013  Rate: 75  Rhythm: normal sinus rhythm  QRS Axis: normal  Intervals: normal  ST/T Wave abnormalities: normal  Conduction Disutrbances: none  Narrative Interpretation: unremarkable  Results for orders placed during the hospital encounter of 01/04/13  CBC WITH DIFFERENTIAL      Result Value Range   WBC 4.0  4.0 - 10.5 K/uL   RBC 4.45  4.22 - 5.81 MIL/uL   Hemoglobin 13.2  13.0 - 17.0 g/dL   HCT 96.0 (*) 45.4 - 09.8 %   MCV 83.1  78.0 - 100.0 fL   MCH 29.7  26.0 - 34.0 pg   MCHC 35.7  30.0 - 36.0 g/dL   RDW 11.9  14.7 - 82.9 %   Platelets 218  150 - 400 K/uL   Neutrophils Relative 31 (*) 43 - 77 %   Neutro Abs 1.2 (*) 1.7 - 7.7 K/uL   Lymphocytes Relative 55 (*) 12 - 46 %   Lymphs Abs 2.2  0.7 - 4.0 K/uL   Monocytes Relative 11  3 - 12 %   Monocytes Absolute 0.4  0.1 - 1.0 K/uL   Eosinophils Relative 2  0 - 5 %   Eosinophils Absolute 0.1  0.0 - 0.7 K/uL   Basophils Relative 1  0 - 1 %   Basophils Absolute 0.1  0.0 - 0.1 K/uL  BASIC METABOLIC PANEL      Result Value Range   Sodium 144  135 - 145 mEq/L   Potassium 4.3  3.5 - 5.1 mEq/L   Chloride 106  96 - 112 mEq/L   CO2 27  19 - 32 mEq/L   Glucose, Bld 93  70 - 99 mg/dL   BUN 8  6 - 23 mg/dL   Creatinine, Ser 5.62  0.50 - 1.35 mg/dL   Calcium 9.3  8.4 - 13.0 mg/dL   GFR calc non Af Amer >90  >90 mL/min   GFR calc Af Amer >90  >90 mL/min   Ct Head Wo Contrast  01/03/2013  *RADIOLOGY REPORT*  Clinical Data: Syncope with fall  CT HEAD WITHOUT  CONTRAST  Technique:  Contiguous axial images were obtained from the base of the skull through the vertex without contrast.  Comparison: None.  Findings: There is bifrontal atrophy, encephalomalacia and gliosis consistent with old head trauma.  Lesser changes are also seen at the temporal tips.  No sign of acute infarction, mass lesion, hemorrhage, hydrocephalus or extra-axial collection.  No calvarial abnormality.  Sinuses, middle ears and mastoids are clear.  IMPRESSION: Bifrontal atrophy, encephalomalacia and gliosis consistent with old head trauma.  Lesser changes at the temporal tips.  No acute finding.   Original Report Authenticated By: Paulina Fusi, M.D.      Patient feels improved and is not lightheaded after hydration with 2 L of intravenous saline. He he would only eat a few graham crackers while here.  MDM  Spoke with Dr.Kohler, resident physician from [facility] internal medicine clinic. Plan clinical call him on 01/06/2013 with an appointment time to be seen that same day. Patient is encouraged to drink lots of fluids. Avoid alcohol I'm concerned the patient may have underlying occult malignancy due to lack of appetite and malaise. Clinically patient is dehydrated upon initial presentation which is likely cause of his lightheadedness upon standing. His left wrist swelling is felt to be benign        Doug Sou, MD 01/04/13 1717  Doug Sou, MD 01/04/13 7829

## 2013-01-04 NOTE — ED Notes (Addendum)
Pt seen yesterday for fall and GI bleeding, discharged home and instructed to follow up with PCP. Now states increased pain and swelling to L hand and L foot, no obvious deformities noted. Denies rectal bleeding today. History of head injury, difficult to obtain history from patient. Friend at bedside unable to provide adequate information. Pt is alert and can answer yes and no questions.

## 2013-01-04 NOTE — ED Notes (Signed)
Pt placed on continuous pulse oximetry and blood pressure cuff 

## 2013-01-04 NOTE — ED Notes (Signed)
Pt undressing and getting into a gown at this time; pt also handed an urinal for pt has to urinate; friend at bedside

## 2013-01-05 LAB — OCCULT BLOOD, POC DEVICE: Fecal Occult Bld: NEGATIVE

## 2013-01-06 ENCOUNTER — Encounter: Payer: Self-pay | Admitting: Internal Medicine

## 2013-01-06 NOTE — ED Provider Notes (Signed)
Medical screening examination/treatment/procedure(s) were conducted as a shared visit with non-physician practitioner(s) and myself.  I personally evaluated the patient during the encounter  Pt presented to the ED after a syncopal episode.  No injuries noted associated with his fall.  We recommended further evaluation, inpatient monitoring.  Pt refused.  Celene Kras, MD 01/06/13 [phone]

## 2013-01-07 ENCOUNTER — Ambulatory Visit (INDEPENDENT_AMBULATORY_CARE_PROVIDER_SITE_OTHER): Payer: Medicare Other | Admitting: Internal Medicine

## 2013-01-07 ENCOUNTER — Encounter: Payer: Self-pay | Admitting: Internal Medicine

## 2013-01-07 ENCOUNTER — Telehealth: Payer: Self-pay | Admitting: *Deleted

## 2013-01-07 VITALS — BP 143/88 | HR 80 | Temp 97.0°F | Ht 68.0 in | Wt 153.6 lb

## 2013-01-07 DIAGNOSIS — K259 Gastric ulcer, unspecified as acute or chronic, without hemorrhage or perforation: Secondary | ICD-10-CM

## 2013-01-07 DIAGNOSIS — R5383 Other fatigue: Secondary | ICD-10-CM

## 2013-01-07 DIAGNOSIS — R5381 Other malaise: Secondary | ICD-10-CM

## 2013-01-07 DIAGNOSIS — R55 Syncope and collapse: Secondary | ICD-10-CM

## 2013-01-07 DIAGNOSIS — F1010 Alcohol abuse, uncomplicated: Secondary | ICD-10-CM

## 2013-01-07 DIAGNOSIS — D500 Iron deficiency anemia secondary to blood loss (chronic): Secondary | ICD-10-CM

## 2013-01-07 LAB — COMPREHENSIVE METABOLIC PANEL
ALT: 11 U/L (ref 0–53)
AST: 20 U/L (ref 0–37)
Albumin: 3.7 g/dL (ref 3.5–5.2)
Alkaline Phosphatase: 68 U/L (ref 39–117)
Potassium: 4.2 mEq/L (ref 3.5–5.3)
Sodium: 137 mEq/L (ref 135–145)
Total Protein: 6.5 g/dL (ref 6.0–8.3)

## 2013-01-07 LAB — CBC WITH DIFFERENTIAL/PLATELET
Basophils Absolute: 0 10*3/uL (ref 0.0–0.1)
Basophils Relative: 1 % (ref 0–1)
Hemoglobin: 13.2 g/dL (ref 13.0–17.0)
MCHC: 33.9 g/dL (ref 30.0–36.0)
Monocytes Relative: 11 % (ref 3–12)
Neutro Abs: 4.3 10*3/uL (ref 1.7–7.7)
Neutrophils Relative %: 66 % (ref 43–77)

## 2013-01-07 LAB — ANEMIA PANEL
%SAT: 15 % — ABNORMAL LOW (ref 20–55)
Ferritin: 51 ng/mL (ref 22–322)
Folate: >20 ng/mL
TIBC: 259 ug/dL (ref 215–435)

## 2013-01-07 MED ORDER — OMEPRAZOLE 20 MG PO CPDR: 40.0000 mg | DELAYED_RELEASE_CAPSULE | Freq: Every day | ORAL | Status: DC

## 2013-01-07 MED ORDER — FERROUS SULFATE 325 (65 FE) MG PO TABS: 325.0000 mg | ORAL_TABLET | Freq: Three times a day (TID) | ORAL | Status: DC

## 2013-01-07 NOTE — Progress Notes (Signed)
Patient: Troy Torres   MRN: [id number]  DOB: Mar 01, 1951  PCP: Troy Danker, MD   Subjective:    HPI: [name] is a 62 y.o. male with a PMHx as outlined below, who presented to clinic today to [facility] care and for the following:  1) ER Follow-up - Patient evaluated at [facility] ER on January 03, 2013 and January 04, 2013 for evaluation of lightheadedness with witnessed fall. Cause of this was unclear - CT head was ordered and was negative for intracranial trauma or other acute finding. FOBT was positive with brown stool, and EKG per report was without acute changes. The patient was recommended to be admitted, however, he left AMA on 4/4. He then returned to ED on 4/5 because of persistent positional lightheadedness sensation and diminished appetite for several weeks to months. At that time, HCT was low at 37, otherwise, rest of CBC and BMET were within normal limits. Patient was treated with 2L of NS and discharged home.  States no preceding palpitations, chest pain, shortness of breath. States that after the episode had some confusion x 2-3 minutes then returned to baseline. No focal weakness or paresthesias, although he does have pain on the left shoulder, which was his point of injury.  Today, the patient describes that he continues to have the positional lightheadedness when sitting up in bed in the morning. Has not had recurrent falls since ER visit.  .  Last seizure 3-4 years ago. Was previously on Depakote 3 years ago secondary to cost.  Review of Systems: Constitutional:  admits to significant fatigue and decreased appetite  Denies fever, chills, diaphoresis, appetite change and fatigue.  HEENT: admits to tinnitus in left ear. Denies eye pain, redness, hearing loss, ear pain, congestion, sore throat, rhinorrhea.  Respiratory: denies SOB, DOE, cough, chest tightness, and wheezing.  Cardiovascular: denies chest pain, palpitations and leg swelling.  Gastrointestinal:  denies nausea, vomiting, abdominal pain, diarrhea, constipation, blood in stool.  Genitourinary: denies dysuria, urgency, frequency, hematuria, flank pain and difficulty urinating.  Musculoskeletal: denies myalgias, back pain, joint swelling, arthralgias and gait problem.   Skin: denies pallor, rash and wound.  Neurological: admits to positional lightheadedness, recent syncope on 4/4 without recurrence Denies dizziness, seizures, focal weakness, numbness and headaches.   Hematological: denies easy bruising, personal or family bleeding history.  Psychiatric/ Behavioral: denies suicidal ideation, mood changes, sleep disturbance and agitation.      Current Outpatient Medications: Medication Sig  . folic acid (FOLVITE) 400 MCG tablet Take 400 mcg by mouth daily.   He is supposed to be on the following medications - but has been out x 2 months Ferrous sulfate 5 gram tab TIDWC Protonix 40mg  tab qday OR omeprazole 20mg  qday   Allergies: No Known Allergies   Past Medical History  Diagnosis Date  . Seizure     generalized major motor seizures // Started after Head injury 2008 // Followed at [facility] by Dr. Sandria Manly  . 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 Iron deficiency anemia due to chronic blood loss     BL Hgb 12-13  . GI bleed   . Antral ulcer 10/2011    Small ulcer noted per Endoscopy (Dr. Madilyn Fireman)  . 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  Madilyn Fireman)  . Esophagitis 10/2011    per EGD (10/2011)  . Alcohol abuse     with history of DTs // states last drink was 10/2012  . Tobacco abuse     Past Surgical History  Procedure Laterality Date  . No past surgeries      Family History  Problem Relation Age of Onset  . Diabetes Mother 65     deceased  . Diabetes Sister   . Heart attack Father 39    deceased  . Heart disease Sister 71   History   Social History  . Marital Status: Legally Separated    Spouse Name: N/A    Number of Children: 2  . Years of Education: 9th grade   Occupational History  . Disability     Since 2010    Social History Main Topics  . Smoking status: Current Every Day Smoker -- 0.50 packs/day for 46 years    Types: Cigarettes  . Smokeless tobacco: Not on file  . Alcohol Use: No     Comment: states he quit 10/2012, previously drinking 1/2 gallon of alcohol per week  . Drug Use: No  . Sexually Active: Not on file   Other Topics Concern  . Not on file   Social History Narrative   Collects social security/disability.  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   Lives alone - he is not allowed to drive because of his prior head injury. His sister-in-law helps provide transportation.     Objective:    Physical Exam: Filed Vitals:   01/07/13 0919  BP: 162/85  Pulse: 80  Temp: 97 F (36.1 C)     General Exam:   General: Vital signs reviewed and noted. Chronically ill appearing, in no acute distress; alert, appropriate and cooperative throughout examination.  Head: Normocephalic, atraumatic.  Eyes: No signs of anemia or jaundince.  Nose: Mucous membranes moist, not inflammed, nonerythematous.  Throat: Oropharynx nonerythematous, no exudate appreciated.   Neck: No deformities, masses, or tenderness noted. Supple, No carotid Bruits, no JVD.  Lungs:  Normal respiratory effort. Clear to auscultation BL without crackles or wheezes.  Heart: RRR. S1 and S2 normal without gallop, murmur, or rubs.  Abdomen:  BS normoactive. Soft, Nondistended, non-tender.  No masses or organomegaly.  Extremities: No pretibial edema.  Skin: Small skin abrasions on face subcentimeter from shaving.     Neurologic Exam:   Mental Status: Alert, oriented, thought content  appropriate.  Speech fluent without evidence of aphasia. Able to follow 3 step commands without difficulty.  Cranial Nerves:   II: Visual fields grossly intact.  III/IV/VI: Extraocular movements intact.  Pupils reactive bilaterally.  V/VII: Smile symmetric. facial light touch sensation normal bilaterally.  VIII: Grossly intact.  IX/X: Normal gag.  XI: Bilateral shoulder shrug normal.  XII: Midline tongue extension normal.  Motor:  5/5 bilaterally with normal tone and bulk  Sensory:  Pinprick and light touch intact throughout, bilaterally    Assessment/ Plan:   The patient's case and plan of care was discussed with attending physician, Dr. Margarito Liner.

## 2013-01-07 NOTE — Patient Instructions (Signed)
General Instructions:  Please follow-up at the clinic in 3 weeks, at which time we will reevaluate your passing out episodes - OR, please follow-up in the clinic sooner if needed.  There have been changes in your medications:  START omeprazole for your stomach  START iron tablets three times a day with food  If you get constipated with the iron (no bowel movement for 3 days) then start taking over the counter colace.   I want to get a few different tests to better understand why you are passing out, we will schedule these for you - please get them done as soon as possible:  2D echocardiogram - to look at your heart  EEG - to see if you are having seizures You are getting labs today, if they are abnormal I will give you a call.  If you are passing out again or feeling worse in any way, you should come back to the ER immediately.  If symptoms worsen, or new symptoms arise, please call the clinic or go to the ER.  PLEASE BRING ALL OF YOUR MEDICATIONS  IN A BAG TO YOUR NEXT APPOINTMENT   Treatment Goals:  Goals (1 Years of Data) as of 01/07/13   None      Progress Toward Treatment Goals:    Self Care Goals & Plans:  Self Care Goal 01/07/2013  Manage my medications take my medicines as prescribed; bring my medications to every visit; refill my medications on time  Eat healthy foods drink diet soda or water instead of juice or soda; eat more vegetables; eat foods that are low in salt  Be physically active take a walk every day  Stop smoking call [facility] (1-800-QUIT-NOW)

## 2013-01-07 NOTE — Assessment & Plan Note (Addendum)
Pertinent Data: CT Head (01/03/13) - Bifrontal atrophy, encephalomalacia and gliosis consistent with old  head trauma. Lesser changes at the temporal tips. No acute finding.  EKG (01/03/13) - normal EKG, normal sinus rhythm, unchanged from previous tracings.  Orthostatic vital signs were negative today.  Assessment: The cause of this patient's presyncopal/syncopal event is not clear at this time. Differential diagnosis is vast. Orthostatic hypotension either secondary to volume depletion (2/2 decreased appetite recently and improvement of sx after IVF) versus autonomic instability (2/2 history of alcohol abuse). Cardiac cause cannot be immediately excluded, as acute arrhythmia could potentially have precipitated his symptoms. However, EKG in the ED was within normal limits. Also considered is seizure activity, particularly given his history of seizures (following head trauma) and that he has been off of his prophylactic seizure medication over the last 3 years.  Of note, he was also found to be FOBT positive during the ED course although hemoglobin at evaluation was greater than 13 (at his baseline) - unclear if this represented a hemoconcentrated specimen in the setting of volume depletion. He denies any overt bleeding in his stools.  Plan:      Check 2-D echocardiogram, EEG.   Check labs including metabolic panel, CBC, TSH, anemia panel.  Restart iron supplementation.   Dependent upon above-mentioned results, will consider neurology referral.  Patient informed of the red flag symptoms that should prompt his immediate return for reevaluation - such as, but not limited to the following: recurrent syncopal events or presyncope, chest pain, palpitations, vision changes, focal weakness. He expresses understanding of the information provided.

## 2013-01-07 NOTE — Progress Notes (Signed)
I discussed the patient with resident Dr. Kalia-Reynolds at the time of the visit, and I reviewed the history, findings, diagnosis, and treatment plans as outlined in the resident's note. I agree with the plans as outlined in her note. 

## 2013-01-07 NOTE — Assessment & Plan Note (Signed)
Assessment: Small antral ulcer noted per EGD 10/2011. Has been off of prilosec x 2 months at least. Had a old bottle of protonix (empty) as well.  Plan:      Restart omeprazole.

## 2013-01-07 NOTE — Telephone Encounter (Signed)
CALLED PATIENT AND LEFT VOICE MESSAGE [phone]). APPOINTMENT FOR 2D ECHO / [place] TO CHECK IN/ 10:00AM CHECK IN 9:45AM.  LELA STURDIVANT NTII  4-8-014  1:38PM

## 2013-01-08 NOTE — Progress Notes (Signed)
Quick Note:  Already on iron supplementation. No acute decline. ______

## 2013-01-10 ENCOUNTER — Ambulatory Visit (HOSPITAL_COMMUNITY)
Admission: RE | Admit: 2013-01-10 | Discharge: 2013-01-10 | Disposition: A | Discharge status: Home / Self Care | Payer: Medicare Other | Source: Ambulatory Visit | Attending: Internal Medicine | Admitting: Internal Medicine

## 2013-01-10 DIAGNOSIS — Z860100 Personal history of colon polyps, unspecified: Secondary | ICD-10-CM | POA: Insufficient documentation

## 2013-01-10 DIAGNOSIS — R55 Syncope and collapse: Secondary | ICD-10-CM | POA: Insufficient documentation

## 2013-01-10 DIAGNOSIS — I517 Cardiomegaly: Secondary | ICD-10-CM

## 2013-01-10 DIAGNOSIS — G40909 Epilepsy, unspecified, not intractable, without status epilepticus: Secondary | ICD-10-CM | POA: Insufficient documentation

## 2013-01-10 NOTE — Progress Notes (Signed)
  Echocardiogram 2D Echocardiogram has been performed.  Jorje Guild 01/10/2013, 11:53 AM

## 2013-01-14 ENCOUNTER — Encounter: Payer: Self-pay | Admitting: Internal Medicine

## 2013-01-14 NOTE — Progress Notes (Signed)
Quick Note:  Will attempt more strict blood pressure control to help with afterload reduction. ______

## 2013-01-16 ENCOUNTER — Ambulatory Visit (HOSPITAL_COMMUNITY): Payer: Medicare Other

## 2013-01-22 ENCOUNTER — Ambulatory Visit (HOSPITAL_COMMUNITY): Payer: Medicare Other

## 2013-01-29 ENCOUNTER — Encounter: Payer: Self-pay | Admitting: Internal Medicine

## 2013-01-29 ENCOUNTER — Ambulatory Visit (INDEPENDENT_AMBULATORY_CARE_PROVIDER_SITE_OTHER): Payer: Medicare Other | Admitting: Internal Medicine

## 2013-01-29 VITALS — BP 155/88 | HR 96 | Temp 96.8°F | Ht 68.0 in | Wt 150.1 lb

## 2013-01-29 DIAGNOSIS — I5032 Chronic diastolic (congestive) heart failure: Secondary | ICD-10-CM

## 2013-01-29 DIAGNOSIS — R55 Syncope and collapse: Secondary | ICD-10-CM

## 2013-01-29 DIAGNOSIS — I10 Essential (primary) hypertension: Secondary | ICD-10-CM

## 2013-01-29 DIAGNOSIS — Z8669 Personal history of other diseases of the nervous system and sense organs: Secondary | ICD-10-CM

## 2013-01-29 DIAGNOSIS — D500 Iron deficiency anemia secondary to blood loss (chronic): Secondary | ICD-10-CM

## 2013-01-29 DIAGNOSIS — F17200 Nicotine dependence, unspecified, uncomplicated: Secondary | ICD-10-CM

## 2013-01-29 MED ORDER — LISINOPRIL 10 MG PO TABS: 10.0000 mg | ORAL_TABLET | Freq: Every day | ORAL | Status: DC

## 2013-01-29 NOTE — Addendum Note (Signed)
Addended by: Priscella Mann on: 01/29/2013 02:27 PM   Modules accepted: Orders

## 2013-01-29 NOTE — Assessment & Plan Note (Signed)
Pertinent Data Reviewed:  2-D echocardiogram (4/11/214) - Left ventricle: LV EF 55%. Wall motion was normal; no regional wall motion abnormalities. Grade 2 diastolic dysfunction. Prepared and Electronically Authenticated by: Marca Ancona, MD  Assessment: Newly diagnosed diastolic CHF, likely in setting of uncontrolled HTN, possibly further contributed by ongoing alcohol abuse (although no obvious dilated cardiomyopathy noted). Will therefore, need aggressive afterload reduction via BP control.  Plan:      Start Lisinopril.

## 2013-01-29 NOTE — Assessment & Plan Note (Addendum)
Pertinent Data:  CT Head (01/03/13) - Bifrontal atrophy, encephalomalacia and gliosis consistent with old head trauma. Lesser changes at the temporal tips. No acute finding.   EKG (01/03/13) - normal EKG, normal sinus rhythm, unchanged from previous tracings.  2-D echocardiogram (4/11/214) - Left ventricle: LV EF 55%. Wall motion was normal; no regional wall motion abnormalities. Grade 2 diastolic dysfunction. Prepared and Electronically Authenticated by: Marca Ancona, MD  Labs Noted and reviewed with patient as below: CMET Ref. Range 01/07/2013  Sodium Latest Range: 135-145 mEq/L 137  Potassium Latest Range: 3.5-5.3 mEq/L 4.2  Chloride Latest Range: 96-112 mEq/L 102  CO2 Latest Range: 19-32 mEq/L 27  BUN Latest Range: 6-23 mg/dL 14  Creatinine Latest Range: 0.50-1.35 mg/dL 1.61  Calcium Latest Range: 8.4-10.5 mg/dL 9.3  Glucose Latest Range: 70-99 mg/dL 90  Alkaline Phosphatase Latest Range: 39-117 U/L 68  Albumin Latest Range: 3.5-5.2 g/dL 3.7  AST Latest Range: 0-37 U/L 20  ALT Latest Range: 0-53 U/L 11  Total Protein Latest Range: 6.0-8.3 g/dL 6.5  Total Bilirubin Latest Range: 0.3-1.2 mg/dL 0.3  IRON PANEL    Iron Latest Range: 42-165 ug/dL 39 (L)  UIBC Latest Range: 125-400 ug/dL 096  TIBC Latest Range: 215-435 ug/dL 045  %SAT Latest Range: 20-55 % 15 (L)  Ferritin Latest Range: 22-322 ng/mL 51  Folate No range found >20.0  Vitamin B-12 Latest Range: 211-911 pg/mL 293  CBC    WBC Latest Range: 4.0-10.5 K/uL 6.5  RBC Latest Range: 4.22-5.81 MIL/uL 4.50  Hemoglobin Latest Range: 13.0-17.0 g/dL 40.9  HCT Latest Range: 39.0-52.0 % 38.9 (L)  MCV Latest Range: 78.0-100.0 fL 86.4  MCH Latest Range: 26.0-34.0 pg 29.3  MCHC Latest Range: 30.0-36.0 g/dL 81.1  RDW Latest Range: 11.5-15.5 % 14.6  Platelets Latest Range: 150-400 K/uL 235  Neutrophils Relative Latest Range: 43-77 % 66  Lymphocytes Relative Latest Range: 12-46 % 20  Monocytes Relative Latest Range: 3-12 % 11   Eosinophils Relative Latest Range: 0-5 % 2  Basophils Relative Latest Range: 0-1 % 1  NEUT# Latest Range: 1.7-7.7 K/uL 4.3  Lymphocytes Absolute Latest Range: 0.7-4.0 K/uL 1.3  Monocytes Absolute Latest Range: 0.1-1.0 K/uL 0.7  Eosinophils Absolute Latest Range: 0.0-0.7 K/uL 0.1  Basophils Absolute Latest Range: 0.0-0.1 K/uL 0.0  RBC. Latest Range: 4.22-5.81 MIL/uL 4.50  Retic Ct Pct Latest Range: 0.4-2.3 % 0.9  ABS Retic Latest Range: 19.0-186.0 K/uL 40.5  Thyroid function test    TSH Latest Range: 0.350-4.500 uIU/mL 2.522   Assessment: During our last visit together, we were evaluating the cause of this patient's presyncopal event.  Cause and thought most likely to be related to possible orthostatic hypotension either secondary to volume depletion (2/2 decreased appetite recently and improvement of sx after IVF) versus autonomic instability (2/2 history of alcohol abuse). This is thought to be most likely the cause at this time, as otherwise, extensive workup has been inconclusive of additional contributing factor.  Specifically, no regional wall motional abnormalities per echo. No electrolyte derangements or renal/hepatic dysfunction apparent. TSH wnl, CBC showing stable hemoglobin.  The patient has taken himself off of Depakote 2-3 years ago secondary to cost. 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 Therefore, remains at risk for recurrent seizure activity. However, I do not think this episode represented seizure activity. Therefore, will not adjust medications at this time (mulitple more likely explanations for his event), as  well, will discontinue scheduled EEG. However, if recurrent event to occur, would reconsider checking EEG and instituting antiepileptic medication that would likely be lifelong therapy.  Fortunately, has not had recurrent events.   Plan:       Discontinue scheduled EEG for now.  Continue to monitor.  If recurrent even, consider check EEG and initiate antiepileptic medication if seizure activity suspected. Patient informed of the red flag symptoms that should prompt his immediate return for reevaluation - such as, but not limited to the following: recurrent syncopal events or presyncope, chest pain, palpitations, vision changes, focal weakness. He expresses understanding of the information provided.

## 2013-01-29 NOTE — Patient Instructions (Signed)
General Instructions:  Please follow-up at the clinic in 3 weeks, at which time we will reevaluate your blood pressure - OR, please follow-up in the clinic sooner if needed.  There have been changes in your medications:  STOP iron tablets  START Lisinopril for your blood pressure - take it once every day. See the information below about this medication.  WORK ON TRYING TO DECREASE YOUR SMOKING / ALCOHOL EVEN MORE - You can do it!!! See tips below.    If you have been started on new medication(s), and you develop symptoms concerning for allergic reaction, including, but not limited to, throat closing, tongue swelling, rash, please stop the medication immediately and call the clinic at [phone], and go to the ER.  If you are diabetic, please bring your meter to your next visit.  If symptoms worsen, or new symptoms arise, please call the clinic or go to the ER.  PLEASE BRING ALL OF YOUR MEDICATIONS  IN A BAG TO YOUR NEXT APPOINTMENT   Treatment Goals:  Goals (1 Years of Data) as of 01/29/13     Lifestyle    . Quit smoking / using tobacco       Progress Toward Treatment Goals:    Self Care Goals & Plans:  Self Care Goal 01/29/2013  Manage my medications take my medicines as prescribed; refill my medications on time  Eat healthy foods eat foods that are low in salt; eat more vegetables; drink diet soda or water instead of juice or soda  Be physically active take a walk every day  Stop smoking -     Lisinopril tablets What is this medicine? LISINOPRIL (lyse IN oh pril) is an ACE inhibitor. This medicine is used to treat high blood pressure and heart failure. It is also used to protect the heart immediately after a heart attack. This medicine may be used for other purposes; ask your health care provider or pharmacist if you have questions. What should I tell my health care provider before I take this medicine? They need to know if you have any of these  conditions: -diabetes -heart or blood vessel disease -immune system disease like lupus or scleroderma -kidney disease -low blood pressure -previous swelling of the tongue, face, or lips with difficulty breathing, difficulty swallowing, hoarseness, or tightening of the throat -an unusual or allergic reaction to lisinopril, other ACE inhibitors, insect venom, foods, dyes, or preservatives -pregnant or trying to get pregnant -breast-feeding How should I use this medicine? Take this medicine by mouth with a glass of water. Follow the directions on your prescription label. You may take this medicine with or without food. Take your medicine at regular intervals. Do not stop taking this medicine except on the advice of your doctor or health care professional. Talk to your pediatrician regarding the use of this medicine in children. Special care may be needed. While this drug may be prescribed for children as young as 74 years of age for selected conditions, precautions do apply. Overdosage: If you think you have taken too much of this medicine contact a poison control center or emergency room at once. NOTE: This medicine is only for you. Do not share this medicine with others. What if I miss a dose? If you miss a dose, take it as soon as you can. If it is almost time for your next dose, take only that dose. Do not take double or extra doses. What may interact with this medicine? -diuretics -lithium -NSAIDs, medicines for pain  and inflammation, like ibuprofen or naproxen -over-the-counter herbal supplements like hawthorn -potassium salts or potassium supplements -salt substitutes This list may not describe all possible interactions. Give your health care provider a list of all the medicines, herbs, non-prescription drugs, or dietary supplements you use. Also tell them if you smoke, drink alcohol, or use illegal drugs. Some items may interact with your medicine. What should I watch for while using  this medicine? Visit your doctor or health care professional for regular check ups. Check your blood pressure as directed. Ask your doctor what your blood pressure should be, and when you should contact him or her. Call your doctor or health care professional if you notice an irregular or fast heart beat. Women should inform their doctor if they wish to become pregnant or think they might be pregnant. There is a potential for serious side effects to an unborn child. Talk to your health care professional or pharmacist for more information. Check with your doctor or health care professional if you get an attack of severe diarrhea, nausea and vomiting, or if you sweat a lot. The loss of too much body fluid can make it dangerous for you to take this medicine. You may get drowsy or dizzy. Do not drive, use machinery, or do anything that needs mental alertness until you know how this drug affects you. Do not stand or sit up quickly, especially if you are an older patient. This reduces the risk of dizzy or fainting spells. Alcohol can make you more drowsy and dizzy. Avoid alcoholic drinks. Avoid salt substitutes unless you are told otherwise by your doctor or health care professional. Do not treat yourself for coughs, colds, or pain while you are taking this medicine without asking your doctor or health care professional for advice. Some ingredients may increase your blood pressure. What side effects may I notice from receiving this medicine? Side effects that you should report to your doctor or health care professional as soon as possible: -abdominal pain with or without nausea or vomiting -allergic reactions like skin rash or hives, swelling of the hands, feet, face, lips, throat, or tongue -dark urine -difficulty breathing -dizzy, lightheaded or fainting spell -fever or sore throat -irregular heart beat, chest pain -pain or difficulty passing urine -redness, blistering, peeling or loosening of the skin,  including inside the mouth -unusually weak -yellowing of the eyes or skin Side effects that usually do not require medical attention (report to your doctor or health care professional if they continue or are bothersome): -change in taste -cough -decreased sexual function or desire -headache -sun sensitivity -tiredness This list may not describe all possible side effects. Call your doctor for medical advice about side effects. You may report side effects to FDA at 1-800-FDA-1088. Where should I keep my medicine? Keep out of the reach of children. Store at room temperature between 15 and 30 degrees C (59 and 86 degrees F). Protect from moisture. Keep container tightly closed. Throw away any unused medicine after the expiration date. NOTE: This sheet is a summary. It may not cover all possible information. If you have questions about this medicine, talk to your doctor, pharmacist, or health care provider.  2012, Elsevier/Gold Standard. (03/23/2008 5:36:32 PM)  LIFESTYLE TIPS TO HELP WITH YOUR BLOOD PRESSURE CONTROL  DASH DIET:  The DASH diet stands for "Dietary Approaches to Stop Hypertension." It is a healthy eating plan that has been shown to reduce high blood pressure (hypertension) in as little as 14 days, while also  possibly providing other significant health benefits. These other health benefits include reducing the risk of breast cancer after menopause and reducing the risk of type 2 diabetes, heart disease, colon cancer, and stroke. Health benefits also include weight loss and slowing kidney failure in patients with chronic kidney disease.   Diet guidelines: Limit salt (sodium). Your diet should contain less than 1500 mg of sodium daily.  Limit refined or processed carbohydrates. Your diet should include mostly whole grains. Desserts and added sugars should be used sparingly.  Include small amounts of heart-healthy fats. These types of fats include nuts, oils, and tub margarine. Limit  saturated and trans fats. These fats have been shown to be harmful in the body.   Choosing Foods: The following food groups are based on a 2000 calorie diet. See your Registered Dietitian for individual calorie needs.  Grains and Grain Products (6 to 8 servings daily)  Eat More Often: Whole-wheat bread, brown rice, whole-grain or wheat pasta, quinoa, popcorn without added fat or salt (air popped).  Eat Less Often: White bread, white pasta, white rice, cornbread.  Vegetables (4 to 5 servings daily)  Eat More Often: Fresh, frozen, and canned vegetables. Vegetables may be raw, steamed, roasted, or grilled with a minimal amount of fat.  Eat Less Often/Avoid: Creamed or fried vegetables. Vegetables in a cheese sauce.  Fruit (4 to 5 servings daily)  Eat More Often: All fresh, canned (in natural juice), or frozen fruits. Dried fruits without added sugar. One hundred percent fruit juice ( cup [237 mL] daily).  Eat Less Often: Dried fruits with added sugar. Canned fruit in light or heavy syrup.  [employer], Fish, and Poultry (2 servings or less daily. One serving is 3 to 4 oz [85-114 g]).  Eat More Often: Ninety percent or leaner ground beef, tenderloin, sirloin. Round cuts of beef, chicken breast, [place] breast. All fish. Grill, bake, or broil your meat. Nothing should be fried.  Eat Less Often/Avoid: Fatty cuts of meat, [place], or chicken leg, thigh, or wing. Fried cuts of meat or fish.  Dairy (2 to 3 servings)  Eat More Often: Low-fat or fat-free milk, low-fat plain or light yogurt, reduced-fat or part-skim cheese.  Eat Less Often/Avoid: Milk (whole, 2%, skim, or chocolate). Whole milk yogurt. Full-fat cheeses.  Nuts, Seeds, and Legumes (4 to 5 servings per week)  Eat More Often: All without added salt.  Eat Less Often/Avoid: Salted nuts and seeds, canned beans with added salt.  Fats and Sweets (limited)  Eat More Often: Vegetable oils, tub margarines without trans fats, sugar-free gelatin.  Mayonnaise and salad dressings.  Eat Less Often/Avoid: Coconut oils, palm oils, butter, stick margarine, cream, half and half, cookies, candy, pie.   ________________________________________________________________________  Smoking Cessation Tips 1-800-QUIT-NOW  This document explains the best ways for you to quit smoking and new treatments to help. It lists new medicines that can double or triple your chances of quitting and quitting for good. It also considers ways to avoid relapses and concerns you may have about quitting, including weight gain.   Nicotine: A Powerful Addiction If you have tried to quit smoking, you know how hard it can be. It is hard because nicotine is a very addictive drug. For some people, it can be as addictive as heroin or cocaine. Usually, people make 2 or 3 tries, or more, before finally being able to quit. Each time you try to quit, you can learn about what helps and what hurts. Quitting takes hard work  and a lot of effort, but you can quit smoking.   Quitting smoking is one of the most important things you will ever do You will live longer, feel better, and live better.  The impact on your body of quitting smoking is felt almost immediately:   Five keys to quitting: Studies have shown that these 5 steps will help you quit smoking and quit for good. You have the best chances of quitting if you use them together:   1. GET READY  Set a quit date.  Change your environment.  Get rid of ALL cigarettes, ashtrays, matches, and lighters in your home, car, and place of work.  Do not let people smoke in your home.  Review your past attempts to quit. Think about what worked and what did not.  Once you quit, do not smoke. NOT EVEN A PUFF!   2. GET SUPPORT AND ENCOURAGEMENT  Tell your family, friends, and coworkers that you are going to quit and need their support. Ask them not to smoke around you.  Get individual, group, or telephone counseling and support.  Many  smokers find one or more of the many self-help books available useful in helping them quit and stay off tobacco.   3. LEARN NEW SKILLS AND BEHAVIORS  Try to distract yourself from urges to smoke. Talk to someone, go for a walk, or occupy your time with a task.  When you first try to quit, change your routine. Take a different route to work. Drink tea instead of coffee. Eat breakfast in a different place.  Do something to reduce your stress. Take a hot bath, exercise, or read a book.  Plan something enjoyable to do every day. Reward yourself for not smoking.  Explore interactive web-based programs that specialize in helping you quit.   4. GET MEDICINE AND USE IT CORRECTLY .  Medicines can help you stop smoking and decrease the urge to smoke. Combining medicine with the above behavioral methods and support can quadruple your chances of successfully quitting smoking.  Talk with your doctor about these options.  5. BE PREPARED FOR RELAPSE OR DIFFICULT SITUATIONS  Most relapses occur within the first 3 months after quitting. Do not be discouraged if you start smoking again. Remember, most people try several times before they finally quit.  You may have symptoms of withdrawal because your body is used to nicotine. You may crave cigarettes, be irritable, feel very hungry, cough often, get headaches, or have difficulty concentrating.  The withdrawal symptoms are only temporary. They are strongest when you first quit, but they will go away within 10 to 14 days.   Quitting takes hard work and a lot of effort, but you can quit smoking.   FOR MORE INFORMATION  Smokefree.gov (http://www.davis-sullivan.com/) provides free, accurate, evidence-based information and professional assistance to help support the immediate and long-term needs of people trying to quit smoking.  Document Released: 09/12/2001 Document Re-Released: 03/08/2010  [facility] Patient Information 2011 [place], [place].

## 2013-01-29 NOTE — Assessment & Plan Note (Signed)
  Assessment: 1. The patient was counseled on the dangers of tobacco use, which include, but are not limited to cardiovascular disease, increased cancer risk of multiple types of cancer, COPD, peripheral vascular disease, strokes. 2. He was also counseled on the benefits of smoking cessation. 3. Progress toward smoking cessation:  smoking less 4. Barriers to progress toward smoking cessation:  none  Plan: 1. Instruction/counseling given: The patient was firmly advised to quit and referred to a tobacco cessation program.   2. We also reviewed strategies to maximize success, including:  Removing cigarettes and smoking materials from environment  Stress management  Substitution of other forms of reinforcement Support of family/friends.  Selecting a quit date. 3. Educational resources provided: [facility] (1-800-QUIT-NOW) brochure 4. Self management tools provided:   5. Medications to assist with smoking cessation: None 6. Patient agreed to the following self-care plans for smoking cessation:  Continue to try to cut down

## 2013-01-29 NOTE — Progress Notes (Signed)
Patient: Troy Torres   MRN: [id number]  DOB: 1951/02/28  PCP: Troy Blue, DO   Subjective:    CC: Follow-up and Medication Refill   HPI: [name] is a 62 y.o. male with a PMHx as outlined below, who presented to clinic today for the following:  1) Presyncope/ Syncope - during our last visit together on 4/8, patient was seen for ER followup for lightheadedness and witnessed fall. Cause was not immediately clear, thought related possibly to orthostatic hypotension vs autonomic instability in setting of alcohol abuse. CT head was already completed and was negative for mass effect or acute bleed. During our last visit, we checked multiple labs including CBC, CMET, TSH in addition to 2D echocardiogram that were negative for obvious cause of this event.   Patient indicates that since our last visit, he has had no recurrent similar events. He remains without CP, palpitations, lightheadedness, dizziness, abdominal pain, N/V/D, recurrent syncopal events, seizure-like activity.  2) HTN, newly diagnosed - Patient does not check blood pressure regularly at home. Currently taking no medications. denies headaches, dizziness, lightheadedness, chest pain, shortness of breath.    3) Diastolic dysfunction, newly diagnosed - during the recent presyncopal episode evaluation, 2-D echocardiogram was ordered and grade 2 diastolic dysfunction was noted. Patient has no known prior history of heart failure. Currently, the patient denies worsening shortness of breath, orthopnea, PND, increased pedal edema. Denies chest pain, tachycardia.    Review of Systems: Per HPI.    Current Outpatient Medications: Medication Sig  . ferrous sulfate 325 (65 FE) MG tablet Take 1 tablet (325 mg total) by mouth 3 (three) times daily with meals.  . folic acid (FOLVITE) 400 MCG tablet Take 400 mcg by mouth daily.  Marland Kitchen omeprazole (PRILOSEC) 20 MG capsule Take 2 capsules (40 mg total) by mouth daily.   Allergies: No  Known Allergies   Past Medical History  Diagnosis Date  . Seizure     generalized major motor seizures // Started after Head injury 2008 // [facility] at [facility] by Dr. Sandria Manly  . 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 Iron deficiency anemia due to chronic blood loss     BL Hgb 12-13  . GI bleed   . Antral ulcer 10/2011    Small ulcer noted per Endoscopy (Dr. Madilyn Fireman)  . 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 Madilyn Fireman)  . Esophagitis 10/2011    per EGD (10/2011)  . Alcohol abuse     with history of DTs // states last drink was 10/2012  . Tobacco abuse   . Chronic diastolic heart failure     grade 2 per 2D echo (12/2012)    Objective:    Physical Exam: Filed Vitals:   01/29/13 1005  BP: 155/88  Pulse: 96  Temp:      General: Vital signs reviewed and noted. Chronically ill appearing, in no acute distress; alert, appropriate and cooperative throughout examination.  Head: Normocephalic, atraumatic.  Eyes: No signs of anemia or jaundince.  Nose: Mucous membranes moist, not inflammed, nonerythematous.  Throat: Oropharynx nonerythematous, no exudate appreciated.   Neck: No deformities, masses, or tenderness noted. Supple, No carotid Bruits, no JVD.  Lungs:  Normal respiratory effort. Clear to auscultation BL without crackles or wheezes.  Heart: RRR.  S1 and S2 normal without gallop, murmur, or rubs.  Abdomen:  BS normoactive. Soft, Nondistended, non-tender.  No masses or organomegaly.  Extremities: No pretibial edema.  Skin: Small skin abrasions on face subcentimeter from shaving.    Assessment/ Plan:   The patient's case and plan of care was discussed with attending physician, Dr. Doneen Poisson.

## 2013-01-29 NOTE — Progress Notes (Signed)
Case discussed with Dr. Kalia-Reynolds at the time of the visit.  We reviewed the resident's history and exam and pertinent patient test results.  I agree with the assessment, diagnosis and plan of care documented in the resident's note. 

## 2013-01-29 NOTE — Assessment & Plan Note (Signed)
Pertinent Data Reviewed: CT Head without contrast (01/03/2013) - Bifrontal atrophy, encephalomalacia and gliosis consistent with old head trauma. Lesser changes at the temporal tips. No acute finding. Original Report Authenticated By: Paulina Fusi, M.D.  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 He was previously followed by Dr. Sandria Manly and was on Depakote chronically. However, the patient took himself off of this medication approximately 2-3 years ago secondary to cost and inability to maintain compliance. He denies recurrent seizure activity. However, CT head is consistent with structural brain abnormalities, therefore, he would have a nidus for seizure activity.   I again do not think that his recent presyncopal episode was seizure related. Therefore, I do not think we need to resume depakote or other antiepileptic at this time. However, if he has recurrent seozure activity, will likely require lifelong therapy.  Plan:      Continue to monitor.

## 2013-01-29 NOTE — Assessment & Plan Note (Signed)
Pertinent Data: BP Readings from Last 3 Encounters:  01/29/13 155/88  01/07/13 143/88  01/04/13 121/74    Basic Metabolic Panel:    Component Value Date/Time   NA 137 01/07/2013 1027   K 4.2 01/07/2013 1027   CL 102 01/07/2013 1027   CO2 27 01/07/2013 1027   BUN 14 01/07/2013 1027   CREATININE 0.86 01/07/2013 1027   CREATININE 0.83 01/04/2013 1503   GLUCOSE 90 01/07/2013 1027   CALCIUM 9.3 01/07/2013 1027    Assessment: Disease Control: mildly elevated  Progress toward goals: unchanged  Barriers to meeting goals: no barriers identified    Newly diagnosed with hypertension. As I think this could be contributing towards his diastolic dysfunction, I think we need to work very hard on afterload reduction via BP control.  Will start Lisinopril today - pt does not have prior intolerance to this medication that he recalls.  Ongoing tobacco and alcohol use (smoking less 1 pack per week and 1x 4oz drink 3 times weekly). He is encouraged to continue efforts to decrease both of these activities.  Plan:  Several 10 mg daily.  Lab visit in 2 weeks for BMET - to ensure renal stability after initiation of ACE inhibitor.  Handout given regarding side effects of medication.  Encouraged to continue efforts towards cutting back on smoking and alcohol.  Educational resources provided: handout  Self management tools provided:

## 2013-01-29 NOTE — Assessment & Plan Note (Signed)
Pertinent Data Reviewed: CBC:    Component Value Date/Time   WBC 6.5 01/07/2013 1027   HGB 13.2 01/07/2013 1027   HCT 38.9* 01/07/2013 1027   PLT 235 01/07/2013 1027   MCV 86.4 01/07/2013 1027   NEUTROABS 4.3 01/07/2013 1027   LYMPHSABS 1.3 01/07/2013 1027   MONOABS 0.7 01/07/2013 1027   EOSABS 0.1 01/07/2013 1027   BASOSABS 0.0 01/07/2013 1027    Lab Results  Component Value Date   IRON 39* 01/07/2013   TIBC 259 01/07/2013   FERRITIN 51 01/07/2013    Assessment: History of iron deficiency anemia in the setting of chronic GI bleed, with colonoscopy in January 2013 showing angiodysplasia and antral ulcer noted on endoscopy at that time. His hemoglobin has since normalized, despite being noncompliant with his iron tablets (secondary to constipation). Additionally, he is no longer iron deficient per his most recent anemia panel. Therefore, I think it would be okay to stop his iron supplementation at this time.  Plan:      Stop iron pills today.  Continue to monitor every 6 months or so given history of GI bleed and recent FOBT positive.

## 2013-02-12 ENCOUNTER — Other Ambulatory Visit: Payer: Medicare Other

## 2013-02-12 ENCOUNTER — Encounter: Payer: Self-pay | Admitting: Internal Medicine

## 2013-03-06 ENCOUNTER — Encounter: Payer: Self-pay | Admitting: Internal Medicine

## 2013-03-06 ENCOUNTER — Ambulatory Visit (INDEPENDENT_AMBULATORY_CARE_PROVIDER_SITE_OTHER): Payer: Medicare Other | Admitting: Internal Medicine

## 2013-03-06 VITALS — BP 100/65 | HR 76 | Temp 97.4°F | Ht 68.0 in | Wt 144.9 lb

## 2013-03-06 DIAGNOSIS — Z0000 Encounter for general adult medical examination without abnormal findings: Secondary | ICD-10-CM

## 2013-03-06 DIAGNOSIS — F17200 Nicotine dependence, unspecified, uncomplicated: Secondary | ICD-10-CM

## 2013-03-06 DIAGNOSIS — Z720 Tobacco use: Secondary | ICD-10-CM

## 2013-03-06 DIAGNOSIS — Z23 Encounter for immunization: Secondary | ICD-10-CM

## 2013-03-06 DIAGNOSIS — E785 Hyperlipidemia, unspecified: Secondary | ICD-10-CM

## 2013-03-06 LAB — LIPID PANEL: LDL Cholesterol: 97 mg/dL (ref 0–99)

## 2013-03-06 MED ORDER — NICOTINE 7 MG/24HR TD PT24: 1.0000 | MEDICATED_PATCH | TRANSDERMAL | Status: DC

## 2013-03-06 MED ORDER — OMEPRAZOLE 40 MG PO CPDR: 40.0000 mg | DELAYED_RELEASE_CAPSULE | Freq: Every day | ORAL | Status: DC

## 2013-03-06 NOTE — Patient Instructions (Addendum)
Use the nicotine patch daily, don't smoke while you use it. Return in 3 months. Blood work today.

## 2013-03-06 NOTE — Progress Notes (Signed)
  Subjective:    Patient ID: Troy Torres, male    DOB: 07-18-1951, 62 y.o.   MRN: [id number]  HPI Feels well.  Denies dizziness, syncope, presyncope.  States he drinks a small amount of vodka once a week.  Admits to using 2-3 cigarettes a day, he is interested in quitting and using "the patch". Denies SOB, CP. No N/V/D/C. No melena, no hematochezia.   Review of Systems Complete 12 point ROS otherwise negative except for that stated in the HPI.    Objective:   Physical Exam  Filed Vitals:   03/06/13 1030  BP: 100/65  Pulse: 76  Temp: 97.4 F (36.3 C)   GEN: AAOx3, NAD. HEENT: EOMI, PERRL, no icterus, no adenopathy. CV: S1S2, no m/r/g. RRR PULM: CTA bilat ABD/GI: Soft, NT, +BS, no guarding, no distention. LE/UE: 2/4 pulses, no c/c/e NEURO: CN II-XII intact, no focal deficits.      Assessment & Plan:  62 yr. Old Male with HTN, HFPEF, tobacco use, seizure disorder, hx gastric ulcer, hx angiodysplasia on cecum and hx GI bleed, presents for follow up. 1) HTN: Well controlled. He is adherent to his medication. 2) Iron deficiency anemia: H/H stable. Hx gastric ulcer and angiodysplasia. No further bleeding noted. 3) Tobacco abuse: educated on use of nicotine patch. Will attempt to quit smoking, only 2 cigs a day. 4) Health Maintenance: Colonoscopy done.  Tdap today. -Return 3 months.

## 2015-03-10 ENCOUNTER — Inpatient Hospital Stay (HOSPITAL_COMMUNITY)
Admission: EM | Admit: 2015-03-10 | Discharge: 2015-03-12 | DRG: 378 | Disposition: A | Discharge status: Home / Self Care | Payer: Medicare Other | Attending: Internal Medicine | Admitting: Internal Medicine

## 2015-03-10 ENCOUNTER — Encounter (HOSPITAL_COMMUNITY): Payer: Self-pay | Admitting: Emergency Medicine

## 2015-03-10 DIAGNOSIS — K922 Gastrointestinal hemorrhage, unspecified: Secondary | ICD-10-CM | POA: Diagnosis present

## 2015-03-10 DIAGNOSIS — Z8669 Personal history of other diseases of the nervous system and sense organs: Secondary | ICD-10-CM

## 2015-03-10 DIAGNOSIS — Z8711 Personal history of peptic ulcer disease: Secondary | ICD-10-CM

## 2015-03-10 DIAGNOSIS — K449 Diaphragmatic hernia without obstruction or gangrene: Secondary | ICD-10-CM | POA: Diagnosis present

## 2015-03-10 DIAGNOSIS — X58XXXA Exposure to other specified factors, initial encounter: Secondary | ICD-10-CM | POA: Diagnosis present

## 2015-03-10 DIAGNOSIS — D62 Acute posthemorrhagic anemia: Secondary | ICD-10-CM | POA: Diagnosis present

## 2015-03-10 DIAGNOSIS — F17210 Nicotine dependence, cigarettes, uncomplicated: Secondary | ICD-10-CM | POA: Diagnosis present

## 2015-03-10 DIAGNOSIS — Z833 Family history of diabetes mellitus: Secondary | ICD-10-CM

## 2015-03-10 DIAGNOSIS — K920 Hematemesis: Secondary | ICD-10-CM | POA: Diagnosis not present

## 2015-03-10 DIAGNOSIS — G40909 Epilepsy, unspecified, not intractable, without status epilepticus: Secondary | ICD-10-CM | POA: Diagnosis present

## 2015-03-10 DIAGNOSIS — I10 Essential (primary) hypertension: Secondary | ICD-10-CM | POA: Diagnosis present

## 2015-03-10 DIAGNOSIS — Z8249 Family history of ischemic heart disease and other diseases of the circulatory system: Secondary | ICD-10-CM

## 2015-03-10 DIAGNOSIS — T182XXA Foreign body in stomach, initial encounter: Secondary | ICD-10-CM | POA: Diagnosis present

## 2015-03-10 DIAGNOSIS — F1010 Alcohol abuse, uncomplicated: Secondary | ICD-10-CM | POA: Diagnosis present

## 2015-03-10 DIAGNOSIS — D500 Iron deficiency anemia secondary to blood loss (chronic): Secondary | ICD-10-CM | POA: Diagnosis present

## 2015-03-10 DIAGNOSIS — I5032 Chronic diastolic (congestive) heart failure: Secondary | ICD-10-CM | POA: Diagnosis present

## 2015-03-10 HISTORY — DX: Gastrointestinal hemorrhage, unspecified: K92.2

## 2015-03-10 HISTORY — DX: Personal history of other medical treatment: Z92.89

## 2015-03-10 LAB — COMPREHENSIVE METABOLIC PANEL
ALBUMIN: 3.9 g/dL (ref 3.5–5.0)
ALT: 18 U/L (ref 17–63)
ANION GAP: 22 — AB (ref 5–15)
AST: 40 U/L (ref 15–41)
Alkaline Phosphatase: 63 U/L (ref 38–126)
BUN: 23 mg/dL — AB (ref 6–20)
CHLORIDE: 100 mmol/L — AB (ref 101–111)
CO2: 18 mmol/L — ABNORMAL LOW (ref 22–32)
CREATININE: 1.24 mg/dL (ref 0.61–1.24)
Calcium: 9.1 mg/dL (ref 8.9–10.3)
GFR calc Af Amer: >60 mL/min (ref >60)
Glucose, Bld: 108 mg/dL — ABNORMAL HIGH (ref 65–99)
POTASSIUM: 4.1 mmol/L (ref 3.5–5.1)
Sodium: 140 mmol/L (ref 135–145)
TOTAL PROTEIN: 7.4 g/dL (ref 6.5–8.1)
Total Bilirubin: 0.7 mg/dL (ref 0.3–1.2)

## 2015-03-10 LAB — CBC
HCT: 29.9 % — ABNORMAL LOW (ref 39.0–52.0)
HEMOGLOBIN: 9.5 g/dL — AB (ref 13.0–17.0)
MCH: 22.6 pg — AB (ref 26.0–34.0)
MCHC: 31.8 g/dL (ref 30.0–36.0)
MCV: 71.2 fL — AB (ref 78.0–100.0)
PLATELETS: 314 10*3/uL (ref 150–400)
RBC: 4.2 MIL/uL — ABNORMAL LOW (ref 4.22–5.81)
RDW: 17.3 % — AB (ref 11.5–15.5)
WBC: 7.5 10*3/uL (ref 4.0–10.5)

## 2015-03-10 LAB — PROTIME-INR
INR: 1.01 (ref 0.00–1.49)
PROTHROMBIN TIME: 13.5 s (ref 11.6–15.2)

## 2015-03-10 MED ORDER — SODIUM CHLORIDE 0.9 % IV BOLUS (SEPSIS)
1000.0000 mL | Freq: Once | INTRAVENOUS | Status: AC
  Administered 2015-03-11: 1000 mL via INTRAVENOUS

## 2015-03-10 MED ORDER — ONDANSETRON HCL 4 MG/2ML IJ SOLN
4.0000 mg | Freq: Once | INTRAMUSCULAR | Status: AC
  Administered 2015-03-10: 4 mg via INTRAVENOUS
  Filled 2015-03-10: qty 2

## 2015-03-10 NOTE — ED Provider Notes (Signed)
CSN: [id number]     Arrival date & time 03/10/15  2227 History   First MD Initiated Contact with Patient 03/10/15 2255     Chief Complaint  Patient presents with  . Hematemesis  . GI Bleeding  . Shortness of Breath  . Fatigue     (Consider location/radiation/quality/duration/timing/severity/associated sxs/prior Treatment) Patient is a 64 y.o. male presenting with abdominal pain.  Abdominal Pain Pain location:  Generalized Pain quality: sharp   Pain radiates to:  Does not radiate Pain severity:  Moderate Onset quality:  Gradual Duration:  1 day Timing:  Constant Progression:  Worsening Chronicity:  Recurrent Context comment:  Prior GI bleed from PUD Relieved by:  Nothing Worsened by:  Palpation Ineffective treatments:  None tried Associated symptoms: hematemesis, hematochezia, nausea and vomiting   Associated symptoms: no anorexia, no diarrhea, no dysuria, no fever and no shortness of breath     Past Medical History  Diagnosis Date  . Seizure     generalized major motor seizures // Started after Head injury 2008 // Followed at [facility] by Dr. Sandria Manly  . 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 Iron deficiency anemia due to chronic blood loss     BL Hgb 12-13  . GI bleed   . Antral ulcer 10/2011    Small ulcer noted per Endoscopy (Dr. Madilyn Fireman)  . 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 Madilyn Fireman)  . Esophagitis 10/2011    per EGD (10/2011)  . Alcohol abuse     with history of DTs // states last drink was 10/2012  . Tobacco abuse   . Chronic diastolic heart failure     grade 2 per 2D echo (12/2012)   Past Surgical History  Procedure Laterality Date  . No past surgeries     Family History  Problem Relation Age of Onset  . Diabetes  Mother 80    deceased  . Diabetes Sister   . Heart attack Father 93    deceased  . Heart disease Sister 97   History  Substance Use Topics  . Smoking status: Current Every Day Smoker -- 0.50 packs/day for 46 years    Types: Cigarettes  . Smokeless tobacco: Not on file     Comment: slowing down  . Alcohol Use: No     Comment: states he quit 10/2012, previously drinking 1/2 gallon of alcohol per week    Review of Systems  Constitutional: Negative for fever.  Respiratory: Negative for shortness of breath.   Gastrointestinal: Positive for nausea, vomiting, abdominal pain, hematochezia and hematemesis. Negative for diarrhea and anorexia.  Genitourinary: Negative for dysuria.  All other systems reviewed and are negative.     Allergies  Review of patient's allergies indicates no known allergies.  Home Medications   Prior to Admission medications   Medication Sig Start Date End Date Taking? Authorizing Provider  aspirin 81 MG tablet Take 81 mg by mouth daily.   Yes Historical Provider, MD  lisinopril (PRINIVIL,ZESTRIL) 10 MG tablet Take 1 tablet (10 mg total) by mouth daily. Patient not taking: Reported on 03/11/2015 01/29/13 01/29/14  Maitri S Kalia-Reynolds, DO   BP 160/86 mmHg  Pulse 89  Temp(Src) 98.7 F (37.1 C) (Oral)  Resp 17  Ht  (1.727 m)  Wt 145  lb 11.6 oz (66.1 kg)  BMI 22.16 kg/m2  SpO2 100% Physical Exam  Constitutional: He is oriented to person, place, and time. He appears well-developed and well-nourished.  HENT:  Head: Normocephalic and atraumatic.  Eyes: Conjunctivae and EOM are normal.  Neck: Normal range of motion. Neck supple.  Cardiovascular: Normal rate, regular rhythm and normal heart sounds.   Pulmonary/Chest: Effort normal and breath sounds normal. No respiratory distress.  Abdominal: He exhibits no distension. There is no tenderness. There is no rebound and no guarding.  Genitourinary: Rectal exam shows external hemorrhoid. Guaiac positive  stool (no melena).  Musculoskeletal: Normal range of motion.  Neurological: He is alert and oriented to person, place, and time.  Skin: Skin is warm and dry.  Vitals reviewed.   ED Course  Procedures (including critical care time) Labs Review Labs Reviewed  CBC - Abnormal; Notable for the following:    RBC 4.20 (*)    Hemoglobin 9.5 (*)    HCT 29.9 (*)    MCV 71.2 (*)    MCH 22.6 (*)    RDW 17.3 (*)    All other components within normal limits  COMPREHENSIVE METABOLIC PANEL - Abnormal; Notable for the following:    Chloride 100 (*)    CO2 18 (*)    Glucose, Bld 108 (*)    BUN 23 (*)    Anion gap 22 (*)    All other components within normal limits  CBC - Abnormal; Notable for the following:    RBC 3.84 (*)    Hemoglobin 8.5 (*)    HCT 27.2 (*)    MCV 70.8 (*)    MCH 22.1 (*)    RDW 17.3 (*)    All other components within normal limits  PROTIME-INR  LACTIC ACID, PLASMA  CBC  CBC  COMPREHENSIVE METABOLIC PANEL  TYPE AND SCREEN  TYPE AND SCREEN    Imaging Review No results found.   EKG Interpretation None      MDM   Final diagnoses:  None    64 y.o. male with pertinent PMH of prior PUD, cecal angiodysplasia presents with recurrent dark red blood per rectum and with hematemesis.  Physical exam as above, no abd tenderness but with hemoccult positive stool.    Wu with anemia.  Consulted hospitalist for admission.  Type and screen performed, but not transfused as he is essentially asymptomatic at this time.  Admitted in stable condition.  I have reviewed all laboratory and imaging studies if ordered as above  No diagnosis found.      Mirian MoMatthew Gentry, MD 03/11/15 [phone]

## 2015-03-10 NOTE — ED Notes (Signed)
Pt reports sudden onset of weakness 3 hrs PTA and diarrhea 30 mins PTA; pt reports bloody stools and bloody emesis today; pt reports hx of bleeding ulcer and was seen at [facility] and given 2 units PRBCs; pt shaky at this time and CAOx4; pt has labored breathing but denies sob; pt also reports feeling "a full bladder but I cant pee since this evening"

## 2015-03-11 ENCOUNTER — Encounter (HOSPITAL_COMMUNITY)
Admission: EM | Disposition: A | Discharge status: Home / Self Care | Payer: Self-pay | Source: Home / Self Care | Attending: Internal Medicine

## 2015-03-11 ENCOUNTER — Encounter (HOSPITAL_COMMUNITY): Payer: Self-pay | Admitting: Internal Medicine

## 2015-03-11 DIAGNOSIS — D62 Acute posthemorrhagic anemia: Secondary | ICD-10-CM | POA: Diagnosis not present

## 2015-03-11 DIAGNOSIS — Z8249 Family history of ischemic heart disease and other diseases of the circulatory system: Secondary | ICD-10-CM | POA: Diagnosis not present

## 2015-03-11 DIAGNOSIS — G40909 Epilepsy, unspecified, not intractable, without status epilepticus: Secondary | ICD-10-CM | POA: Diagnosis present

## 2015-03-11 DIAGNOSIS — I5032 Chronic diastolic (congestive) heart failure: Secondary | ICD-10-CM

## 2015-03-11 DIAGNOSIS — I10 Essential (primary) hypertension: Secondary | ICD-10-CM | POA: Diagnosis not present

## 2015-03-11 DIAGNOSIS — K449 Diaphragmatic hernia without obstruction or gangrene: Secondary | ICD-10-CM | POA: Diagnosis present

## 2015-03-11 DIAGNOSIS — Z8711 Personal history of peptic ulcer disease: Secondary | ICD-10-CM | POA: Diagnosis not present

## 2015-03-11 DIAGNOSIS — F17210 Nicotine dependence, cigarettes, uncomplicated: Secondary | ICD-10-CM | POA: Diagnosis present

## 2015-03-11 DIAGNOSIS — T182XXA Foreign body in stomach, initial encounter: Secondary | ICD-10-CM | POA: Diagnosis present

## 2015-03-11 DIAGNOSIS — K922 Gastrointestinal hemorrhage, unspecified: Secondary | ICD-10-CM | POA: Diagnosis not present

## 2015-03-11 DIAGNOSIS — X58XXXA Exposure to other specified factors, initial encounter: Secondary | ICD-10-CM | POA: Diagnosis present

## 2015-03-11 DIAGNOSIS — K920 Hematemesis: Secondary | ICD-10-CM | POA: Diagnosis present

## 2015-03-11 DIAGNOSIS — F1010 Alcohol abuse, uncomplicated: Secondary | ICD-10-CM | POA: Diagnosis present

## 2015-03-11 DIAGNOSIS — Z833 Family history of diabetes mellitus: Secondary | ICD-10-CM | POA: Diagnosis not present

## 2015-03-11 DIAGNOSIS — D500 Iron deficiency anemia secondary to blood loss (chronic): Secondary | ICD-10-CM | POA: Diagnosis present

## 2015-03-11 HISTORY — PX: ESOPHAGOGASTRODUODENOSCOPY: SHX5428

## 2015-03-11 LAB — COMPREHENSIVE METABOLIC PANEL
ALT: 15 U/L — AB (ref 17–63)
ANION GAP: 12 (ref 5–15)
AST: 29 U/L (ref 15–41)
Albumin: 3.3 g/dL — ABNORMAL LOW (ref 3.5–5.0)
Alkaline Phosphatase: 56 U/L (ref 38–126)
BUN: 22 mg/dL — ABNORMAL HIGH (ref 6–20)
CHLORIDE: 102 mmol/L (ref 101–111)
CO2: 23 mmol/L (ref 22–32)
Calcium: 8.4 mg/dL — ABNORMAL LOW (ref 8.9–10.3)
Creatinine, Ser: 1.18 mg/dL (ref 0.61–1.24)
GFR calc Af Amer: >60 mL/min (ref >60)
Glucose, Bld: 94 mg/dL (ref 65–99)
Potassium: 4.4 mmol/L (ref 3.5–5.1)
Sodium: 137 mmol/L (ref 135–145)
TOTAL PROTEIN: 6.2 g/dL — AB (ref 6.5–8.1)
Total Bilirubin: 0.9 mg/dL (ref 0.3–1.2)

## 2015-03-11 LAB — CBC
HCT: 27.6 % — ABNORMAL LOW (ref 39.0–52.0)
HCT: 27.5 % — ABNORMAL LOW (ref 39.0–52.0)
HEMATOCRIT: 27.2 % — AB (ref 39.0–52.0)
Hemoglobin: 8.5 g/dL — ABNORMAL LOW (ref 13.0–17.0)
Hemoglobin: 8.7 g/dL — ABNORMAL LOW (ref 13.0–17.0)
Hemoglobin: 8.7 g/dL — ABNORMAL LOW (ref 13.0–17.0)
MCH: 22.1 pg — ABNORMAL LOW (ref 26.0–34.0)
MCH: 22.5 pg — AB (ref 26.0–34.0)
MCH: 22.8 pg — ABNORMAL LOW (ref 26.0–34.0)
MCHC: 31.3 g/dL (ref 30.0–36.0)
MCHC: 31.5 g/dL (ref 30.0–36.0)
MCHC: 31.6 g/dL (ref 30.0–36.0)
MCV: 70.8 fL — ABNORMAL LOW (ref 78.0–100.0)
MCV: 71.3 fL — ABNORMAL LOW (ref 78.0–100.0)
MCV: 72 fL — ABNORMAL LOW (ref 78.0–100.0)
PLATELETS: 287 10*3/uL (ref 150–400)
Platelets: 279 10*3/uL (ref 150–400)
Platelets: 258 10*3/uL (ref 150–400)
RBC: 3.84 MIL/uL — ABNORMAL LOW (ref 4.22–5.81)
RBC: 3.87 MIL/uL — ABNORMAL LOW (ref 4.22–5.81)
RBC: 3.82 MIL/uL — ABNORMAL LOW (ref 4.22–5.81)
RDW: 17.3 % — ABNORMAL HIGH (ref 11.5–15.5)
RDW: 17.6 % — AB (ref 11.5–15.5)
RDW: 17.6 % — ABNORMAL HIGH (ref 11.5–15.5)
WBC: 7.9 10*3/uL (ref 4.0–10.5)
WBC: 7.2 10*3/uL (ref 4.0–10.5)
WBC: 6.5 10*3/uL (ref 4.0–10.5)

## 2015-03-11 LAB — IRON AND TIBC
Iron: 145 ug/dL (ref 45–182)
Saturation Ratios: 40 % — ABNORMAL HIGH (ref 17.9–39.5)
TIBC: 365 ug/dL (ref 250–450)
UIBC: 220 ug/dL

## 2015-03-11 LAB — TYPE AND SCREEN
ABO/RH(D): B POS
Antibody Screen: NEGATIVE

## 2015-03-11 LAB — FERRITIN: Ferritin: 24 ng/mL (ref 24–336)

## 2015-03-11 LAB — LACTIC ACID, PLASMA: LACTIC ACID, VENOUS: 1.1 mmol/L (ref 0.5–2.0)

## 2015-03-11 SURGERY — EGD (ESOPHAGOGASTRODUODENOSCOPY)
Anesthesia: Moderate Sedation

## 2015-03-11 MED ORDER — SODIUM CHLORIDE 0.9 % IV SOLN: INTRAVENOUS | Status: DC

## 2015-03-11 MED ORDER — ADULT MULTIVITAMIN W/MINERALS CH
1.0000 | ORAL_TABLET | Freq: Every day | ORAL | Status: DC
  Administered 2015-03-11 – 2015-03-12 (×2): 1 via ORAL
  Filled 2015-03-11 (×2): qty 1

## 2015-03-11 MED ORDER — PANTOPRAZOLE SODIUM 40 MG PO TBEC
40.0000 mg | DELAYED_RELEASE_TABLET | Freq: Every day | ORAL | Status: DC
  Administered 2015-03-11 – 2015-03-12 (×2): 40 mg via ORAL
  Filled 2015-03-11 (×2): qty 1

## 2015-03-11 MED ORDER — FENTANYL CITRATE (PF) 100 MCG/2ML IJ SOLN
INTRAMUSCULAR | Status: DC | PRN
  Administered 2015-03-11 (×2): 25 ug via INTRAVENOUS

## 2015-03-11 MED ORDER — PANTOPRAZOLE SODIUM 40 MG IV SOLR
40.0000 mg | Freq: Once | INTRAVENOUS | Status: AC
  Administered 2015-03-11: 40 mg via INTRAVENOUS
  Filled 2015-03-11: qty 40

## 2015-03-11 MED ORDER — DIPHENHYDRAMINE HCL 50 MG/ML IJ SOLN
INTRAMUSCULAR | Status: DC | PRN
  Administered 2015-03-11: 25 mg via INTRAVENOUS

## 2015-03-11 MED ORDER — LORAZEPAM 2 MG/ML IJ SOLN: 1.0000 mg | Freq: Four times a day (QID) | INTRAMUSCULAR | Status: DC | PRN

## 2015-03-11 MED ORDER — PANTOPRAZOLE SODIUM 40 MG IV SOLR
80.0000 mg | Freq: Once | INTRAVENOUS | Status: AC
  Administered 2015-03-11: 80 mg via INTRAVENOUS
  Filled 2015-03-11: qty 80

## 2015-03-11 MED ORDER — PNEUMOCOCCAL VAC POLYVALENT 25 MCG/0.5ML IJ INJ: 0.5000 mL | INJECTION | INTRAMUSCULAR | Status: DC

## 2015-03-11 MED ORDER — MIDAZOLAM HCL 10 MG/2ML IJ SOLN
INTRAMUSCULAR | Status: DC | PRN
  Administered 2015-03-11 (×3): 2 mg via INTRAVENOUS

## 2015-03-11 MED ORDER — HYDRALAZINE HCL 20 MG/ML IJ SOLN: 5.0000 mg | INTRAMUSCULAR | Status: DC | PRN

## 2015-03-11 MED ORDER — FOLIC ACID 1 MG PO TABS
1.0000 mg | ORAL_TABLET | Freq: Every day | ORAL | Status: DC
  Administered 2015-03-11 – 2015-03-12 (×2): 1 mg via ORAL
  Filled 2015-03-11 (×2): qty 1

## 2015-03-11 MED ORDER — BUTAMBEN-TETRACAINE-BENZOCAINE 2-2-14 % EX AERO
INHALATION_SPRAY | CUTANEOUS | Status: DC | PRN
  Administered 2015-03-11: 2 via TOPICAL

## 2015-03-11 MED ORDER — SODIUM CHLORIDE 0.9 % IV SOLN
8.0000 mg/h | INTRAVENOUS | Status: DC
  Administered 2015-03-11: 8 mg/h via INTRAVENOUS
  Filled 2015-03-11 (×3): qty 80

## 2015-03-11 MED ORDER — FERROUS SULFATE 325 (65 FE) MG PO TABS
325.0000 mg | ORAL_TABLET | Freq: Every day | ORAL | Status: DC
  Filled 2015-03-11: qty 1

## 2015-03-11 MED ORDER — BOOST / RESOURCE BREEZE PO LIQD
1.0000 | Freq: Three times a day (TID) | ORAL | Status: DC
  Administered 2015-03-11 – 2015-03-12 (×3): 1 via ORAL

## 2015-03-11 MED ORDER — LORAZEPAM 1 MG PO TABS
1.0000 mg | ORAL_TABLET | Freq: Four times a day (QID) | ORAL | Status: DC | PRN
  Filled 2015-03-11: qty 1

## 2015-03-11 MED ORDER — ACETAMINOPHEN 325 MG PO TABS
650.0000 mg | ORAL_TABLET | Freq: Four times a day (QID) | ORAL | Status: DC | PRN
Start: 2015-03-11 — End: 2015-03-12

## 2015-03-11 MED ORDER — LORAZEPAM 2 MG/ML IJ SOLN: 0.0000 mg | Freq: Two times a day (BID) | INTRAMUSCULAR | Status: DC

## 2015-03-11 MED ORDER — DIPHENHYDRAMINE HCL 50 MG/ML IJ SOLN
INTRAMUSCULAR | Status: AC
  Filled 2015-03-11: qty 1

## 2015-03-11 MED ORDER — FENTANYL CITRATE (PF) 100 MCG/2ML IJ SOLN
INTRAMUSCULAR | Status: AC
  Filled 2015-03-11: qty 2

## 2015-03-11 MED ORDER — PANTOPRAZOLE SODIUM 40 MG IV SOLR: 40.0000 mg | Freq: Two times a day (BID) | INTRAVENOUS | Status: DC

## 2015-03-11 MED ORDER — FERROUS SULFATE 325 (65 FE) MG PO TABS
325.0000 mg | ORAL_TABLET | Freq: Every day | ORAL | Status: DC
  Administered 2015-03-12: 325 mg via ORAL
  Filled 2015-03-11 (×2): qty 1

## 2015-03-11 MED ORDER — LORAZEPAM 2 MG/ML IJ SOLN
0.0000 mg | Freq: Four times a day (QID) | INTRAMUSCULAR | Status: DC
  Administered 2015-03-11: 1 mg via INTRAVENOUS
  Filled 2015-03-11: qty 1

## 2015-03-11 MED ORDER — THIAMINE HCL 100 MG/ML IJ SOLN
100.0000 mg | Freq: Every day | INTRAMUSCULAR | Status: DC
  Filled 2015-03-11 (×2): qty 1

## 2015-03-11 MED ORDER — ONDANSETRON HCL 4 MG/2ML IJ SOLN: 4.0000 mg | Freq: Four times a day (QID) | INTRAMUSCULAR | Status: DC | PRN

## 2015-03-11 MED ORDER — ONDANSETRON HCL 4 MG PO TABS: 4.0000 mg | ORAL_TABLET | Freq: Four times a day (QID) | ORAL | Status: DC | PRN

## 2015-03-11 MED ORDER — SODIUM CHLORIDE 0.9 % IV SOLN
INTRAVENOUS | Status: DC
  Administered 2015-03-11: 1000 mL via INTRAVENOUS
  Administered 2015-03-11 (×2): via INTRAVENOUS

## 2015-03-11 MED ORDER — LISINOPRIL 10 MG PO TABS
10.0000 mg | ORAL_TABLET | Freq: Every day | ORAL | Status: DC
  Administered 2015-03-11 – 2015-03-12 (×2): 10 mg via ORAL
  Filled 2015-03-11 (×2): qty 1

## 2015-03-11 MED ORDER — MIDAZOLAM HCL 5 MG/ML IJ SOLN
INTRAMUSCULAR | Status: AC
  Filled 2015-03-11: qty 2

## 2015-03-11 MED ORDER — VITAMIN B-1 100 MG PO TABS
100.0000 mg | ORAL_TABLET | Freq: Every day | ORAL | Status: DC
  Administered 2015-03-11 – 2015-03-12 (×2): 100 mg via ORAL
  Filled 2015-03-11 (×2): qty 1

## 2015-03-11 MED ORDER — ACETAMINOPHEN 650 MG RE SUPP: 650.0000 mg | Freq: Four times a day (QID) | RECTAL | Status: DC | PRN

## 2015-03-11 NOTE — Consult Note (Signed)
Reason for Consult: Upper GI bleeding Referring Physician: Hospital team  Troy Torres is an 64 y.o. male.  HPI: Patient with a history of antral erosions but also cecal AVMs who has been on aspirin without pump inhibitors but no extra non-steroidal's and does drink some but not every day who threw up some blood yesterday and had some black diarrhea yesterday and supposedly was on pump inhibitors in the past but either his doctors stopped it or insurance wouldn't pay for it but he has not been on it lately and he does have some chronic reflux but no other GI complaints  Past Medical History  Diagnosis Date  . Seizure     generalized major motor seizures // Started after Head injury 2008 // Followed at [facility] by Dr. Erling Cruz  . 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 Iron deficiency anemia due to chronic blood loss     BL Hgb 12-13  . GI bleed   . Antral ulcer 10/2011    Small ulcer noted per Endoscopy (Dr. Amedeo Plenty)  . 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 Amedeo Plenty)  . Esophagitis 10/2011    per EGD (10/2011)  . Alcohol abuse     with history of DTs // states last drink was 10/2012  . Tobacco abuse   . Chronic diastolic heart failure     grade 2 per 2D echo (12/2012)    Past Surgical History  Procedure Laterality Date  . No past surgeries      Family History  Problem Relation Age of Onset  . Diabetes Mother 62    deceased  . Diabetes Sister   . Heart attack Father 31    deceased  . Heart disease Sister 83    Social History:  reports that he has been smoking Cigarettes.  He has a 23 pack-year smoking history. He does not have any smokeless tobacco history on file. He reports that he does not drink alcohol or use illicit drugs.  Allergies: No  Known Allergies  Medications: I have reviewed the patient's current medications.  Results for orders placed or performed during the hospital encounter of 03/10/15 (from the past 48 hour(s))  Protime-INR (if patient is taking Coumadin)     Status: None   Collection Time: 03/10/15 10:45 PM  Result Value Ref Range   Prothrombin Time 13.5 11.6 - 15.2 seconds   INR 1.01 0.00 - 1.49  CBC     Status: Abnormal   Collection Time: 03/10/15 10:45 PM  Result Value Ref Range   WBC 7.5 4.0 - 10.5 K/uL   RBC 4.20 (L) 4.22 - 5.81 MIL/uL   Hemoglobin 9.5 (L) 13.0 - 17.0 g/dL   HCT 29.9 (L) 39.0 - 52.0 %   MCV 71.2 (L) 78.0 - 100.0 fL   MCH 22.6 (L) 26.0 - 34.0 pg   MCHC 31.8 30.0 - 36.0 g/dL   RDW 17.3 (H) 11.5 - 15.5 %   Platelets 314 150 - 400 K/uL  Comprehensive metabolic panel     Status: Abnormal   Collection Time: 03/10/15 10:45 PM  Result Value Ref Range   Sodium 140 135 - 145 mmol/L   Potassium 4.1 3.5 - 5.1 mmol/L   Chloride 100 (L) 101 - 111 mmol/L   CO2 18 (  L) 22 - 32 mmol/L   Glucose, Bld 108 (H) 65 - 99 mg/dL   BUN 23 (H) 6 - 20 mg/dL   Creatinine, Ser 1.24 0.61 - 1.24 mg/dL   Calcium 9.1 8.9 - 10.3 mg/dL   Total Protein 7.4 6.5 - 8.1 g/dL   Albumin 3.9 3.5 - 5.0 g/dL   AST 40 15 - 41 U/L   ALT 18 17 - 63 U/L   Alkaline Phosphatase 63 38 - 126 U/L   Total Bilirubin 0.7 0.3 - 1.2 mg/dL   GFR calc non Af Amer >60 >60 mL/min   GFR calc Af Amer >60 >60 mL/min    Comment: (NOTE) The eGFR has been calculated using the CKD EPI equation. This calculation has not been validated in all clinical situations. eGFR's persistently <60 mL/min signify possible Chronic Kidney Disease.    Anion gap 22 (H) 5 - 15  Type and screen     Status: None   Collection Time: 03/10/15 11:07 PM  Result Value Ref Range   ABO/RH(D) B POS    Antibody Screen NEG    Sample Expiration 03/13/2015   CBC     Status: Abnormal   Collection Time: 03/11/15  3:28 AM  Result Value Ref Range   WBC 7.9 4.0 -  10.5 K/uL   RBC 3.84 (L) 4.22 - 5.81 MIL/uL   Hemoglobin 8.5 (L) 13.0 - 17.0 g/dL   HCT 27.2 (L) 39.0 - 52.0 %   MCV 70.8 (L) 78.0 - 100.0 fL   MCH 22.1 (L) 26.0 - 34.0 pg   MCHC 31.3 30.0 - 36.0 g/dL   RDW 17.3 (H) 11.5 - 15.5 %   Platelets 287 150 - 400 K/uL  Lactic acid, plasma     Status: None   Collection Time: 03/11/15  3:28 AM  Result Value Ref Range   Lactic Acid, Venous 1.1 0.5 - 2.0 mmol/L  CBC     Status: Abnormal   Collection Time: 03/11/15  7:16 AM  Result Value Ref Range   WBC 7.2 4.0 - 10.5 K/uL   RBC 3.87 (L) 4.22 - 5.81 MIL/uL   Hemoglobin 8.7 (L) 13.0 - 17.0 g/dL   HCT 27.6 (L) 39.0 - 52.0 %   MCV 71.3 (L) 78.0 - 100.0 fL   MCH 22.5 (L) 26.0 - 34.0 pg   MCHC 31.5 30.0 - 36.0 g/dL   RDW 17.6 (H) 11.5 - 15.5 %   Platelets 279 150 - 400 K/uL  Comprehensive metabolic panel     Status: Abnormal   Collection Time: 03/11/15  7:16 AM  Result Value Ref Range   Sodium 137 135 - 145 mmol/L   Potassium 4.4 3.5 - 5.1 mmol/L   Chloride 102 101 - 111 mmol/L   CO2 23 22 - 32 mmol/L   Glucose, Bld 94 65 - 99 mg/dL   BUN 22 (H) 6 - 20 mg/dL   Creatinine, Ser 1.18 0.61 - 1.24 mg/dL   Calcium 8.4 (L) 8.9 - 10.3 mg/dL   Total Protein 6.2 (L) 6.5 - 8.1 g/dL   Albumin 3.3 (L) 3.5 - 5.0 g/dL   AST 29 15 - 41 U/L   ALT 15 (L) 17 - 63 U/L   Alkaline Phosphatase 56 38 - 126 U/L   Total Bilirubin 0.9 0.3 - 1.2 mg/dL   GFR calc non Af Amer >60 >60 mL/min   GFR calc Af Amer >60 >60 mL/min    Comment: (NOTE) The eGFR has been  calculated using the CKD EPI equation. This calculation has not been validated in all clinical situations. eGFR's persistently <60 mL/min signify possible Chronic Kidney Disease.    Anion gap 12 5 - 15    No results found.  ROS negative except above Blood pressure 167/88, pulse 75, temperature 98.2 F (36.8 C), temperature source Oral, resp. rate 16, height 5' 8"  (1.727 m), weight 65.772 kg (145 lb), SpO2 96 %. Physical Exam vital signs stable  afebrile no acute distress exam please see preassessment evaluation labs reviewed  Assessment/Plan: Upper GI bleeding in a patient on aspirin and history of antral erosions Plan: The risks benefits methods of endoscopy was discussed with the patient and will proceed this morning with further workup and plans pending those findings  Hamilton E 03/11/2015, 8:54 AM

## 2015-03-11 NOTE — Progress Notes (Signed)
Per Susie, Admitting Nurse, since pt has already been immunized with the pneumonia vaccine in June, 2013, this order has been cancelled.

## 2015-03-11 NOTE — Op Note (Signed)
[facility] [address] [place], [address]   ENDOSCOPY PROCEDURE REPORT  PATIENT: Troy Torres, Troy Torres  MR#: [id number] BIRTHDATE: [birth year] , 63  yrs. old GENDER: male ENDOSCOPIST: Vida Rigger, MD REFERRED BY: PROCEDURE DATE:  03/21/2015 PROCEDURE:  EGD w/ fb removal and EGD, diagnostic ASA CLASS:     Class II INDICATIONS:  hematemesis. MEDICATIONS: Benadryl 25 mg IV, Fentanyl 50 mcg IV, and Versed 6 mg IV TOPICAL ANESTHETIC: Cetacaine Spray  DESCRIPTION OF PROCEDURE: After the risks benefits and alternatives of the procedure were thoroughly explained, informed consent was obtained.  The Pentax Gastroscope [id number] endoscope was introduced through the mouth and advanced to the second portion of the duodenum , Without limitations.  The instrument was slowly withdrawn as the mucosa was fully examined. Estimated blood loss is zero unless otherwise noted in this procedure report.    The findings are recorded below       Retroflexed views revealed no abnormalities.     The scope was then withdrawn from the patient and the procedure completed.  COMPLICATIONS: There were no immediate complications.  ENDOSCOPIC IMPRESSION: 1.tiny to small hiatal hernia        2. Rubber band in the stomach status post removed with rat tooth 3. Otherwise within normal limits EGD without signs of bleeding or other abnormalities  RECOMMENDATIONS: clear liquid diet oral pump inhibitors once a day should be fine reevaluate aspirin use consider endoscopy Or Repeat Colonoscopy If      signs of bleeding continue which both probably could be decided as an outpatient on close follow-up  REPEAT EXAM: as needed  eSigned:  Vida Rigger, MD 03-21-2015 9:32 AM    CC:  CPT CODES: ICD CODES:  The ICD and CPT codes recommended by this software are interpretations from the data that the clinical staff has captured with the software.  The verification of the translation of  this report to the ICD and CPT codes and modifiers is the sole responsibility of the health care institution and practicing physician where this report was generated.  [employer] will not be held responsible for the validity of the ICD and CPT codes included on this report.  AMA assumes no liability for data contained or not contained herein. CPT is a Publishing rights manager of the [employer].  PATIENT NAME:  L…O, Ridenhour MR#: [id number]

## 2015-03-11 NOTE — Progress Notes (Addendum)
Transfer note: transferred from hospitalist service to IMTS 03/11/2015 4 pm.    Subjective:  Doing fine today. No longer having any hematemesis or emesis, or bloody bowel movement. No complaints today.  When asked about why he is on aspirin, he did not know. He also did not know about the fact he was supposed to be on protonix.    Objective: Vital signs in last 24 hours: Filed Vitals:   03/11/15 1011 03/11/15 1100 03/11/15 1243 03/11/15 1412  BP: 149/66 130/70 149/76 134/78  Pulse: 62 68 64 79  Temp: 98.2 F (36.8 C)   98.2 F (36.8 C)  TempSrc: Oral   Oral  Resp: 18   20  Height:      Weight:      SpO2: 98%   98%   Weight change:   Intake/Output Summary (Last 24 hours) at 03/11/15 1612 Last data filed at 03/11/15 1443  Gross per 24 hour  Intake   1190 ml  Output   1850 ml  Net   -660 ml    Vitals reviewed. General: resting in bed, NAD HEENT: PERRL, EOMI, no scleral icterus Cardiac: RRR, no rubs, murmurs or gallops Pulm: clear to auscultation bilaterally, no wheezes, rales, or rhonchi Abd: soft, nontender, nondistended, BS present Ext: warm and well perfused, no pedal edema Neuro: alert and oriented X3, cranial nerves II-XII grossly intact, strength and sensation to light touch equal in bilateral upper and lower extremities  Lab Results: Basic Metabolic Panel:  Recent Labs Lab 03/10/15 2245 03/11/15 0716  NA 140 137  K 4.1 4.4  CL 100* 102  CO2 18* 23  GLUCOSE 108* 94  BUN 23* 22*  CREATININE 1.24 1.18  CALCIUM 9.1 8.4*   Liver Function Tests:  Recent Labs Lab 03/10/15 2245 03/11/15 0716  AST 40 29  ALT 18 15*  ALKPHOS 63 56  BILITOT 0.7 0.9  PROT 7.4 6.2*  ALBUMIN 3.9 3.3*   CBC:  Recent Labs Lab 03/11/15 0716 03/11/15 1132  WBC 7.2 6.5  HGB 8.7* 8.7*  HCT 27.6* 27.5*  MCV 71.3* 72.0*  PLT 279 258   Coagulation:  Recent Labs Lab 03/10/15 2245  LABPROT 13.5  INR 1.01   Misc. Labs:  Micro Results: No results found for this or  any previous visit (from the past 240 hour(s)). Studies/Results: No results found. Medications: I have reviewed the patient's current medications. Scheduled Meds: . folic acid  1 mg Oral Daily  . LORazepam  0-4 mg Intravenous 4 times per day   Followed by  . [START ON 03/13/2015] LORazepam  0-4 mg Intravenous Q12H  . multivitamin with minerals  1 tablet Oral Daily  . pantoprazole  40 mg Oral Daily  . [START ON 03/12/2015] pneumococcal 23 valent vaccine  0.5 mL Intramuscular Tomorrow-1000  . thiamine  100 mg Oral Daily   Or  . thiamine  100 mg Intravenous Daily   Continuous Infusions: . sodium chloride 1,000 mL (03/11/15 1556)  . sodium chloride Stopped (03/11/15 1115)   PRN Meds:.acetaminophen **OR** acetaminophen, hydrALAZINE, LORazepam **OR** LORazepam, ondansetron **OR** ondansetron (ZOFRAN) IV Assessment/Plan: Principal Problem:   Acute GI bleeding Active Problems:   History of seizure disorder   Chronic diastolic heart failure   Acute blood loss anemia   Essential hypertension  64 yo male with hx of antral erosions and cecal AVMs here with acute GI bleed (presented with bloody emesis and black stool  Acute GI bleed- with hx of antral erosion, esophagitis, and  cecal AVMs - presented with bloody emesis and black stool.  hgb stable around 8.7. B/l 13.3 on 2014.  Was on PPI drip and NPO and cbc was monitored.- GI saw patient, EGD done, shows tiny small hiatal hernia. No signs of bleeding or other abnormalities. GI recommended oral PPI qdaily. - stop aspirin as no clear indication (CAD, CVA). for it and the risk of bleeding would outweigh the benefit.   Acute blood loss anemia on Chronic normocytic Anemia - likely iron deficiency from AVM - last hgb was 13 2 years ago, not sure what new baseline is.  - recheck anemia panel - started PO iron 325mg  daily.  Hx of seizure and intracranial hemorrhage- has not taken any meds for seizure for long time - consider f/up with neurology  outpatient? Saw Dr. Sandria Manly in the past. Will have him follow up.  Hx of HTN - has not taken anything but has lisinopril on the list. -monitor BP. Restarted lisinopril.  Alcohol abuse with hx of DTs -states only drinking small amount of wine occasionally  had mild tremor on admission - on CIWA protocol.appears stable now.   Tobacco abuse - smokes 1ppd, has tried chantix but he does not like it - interested in quitting, stated he will follow up about this in the clinic.  Chronic diastolic heart failure - grade 2 diastolic with normal EF  - volume status appears normal. Cont lisinopril.  Diet: regular Dvt: scd.  Dispo: Disposition is deferred at this time, awaiting improvement of current medical problems.  Anticipated discharge in approximately 1-2 day(s).   The patient does have a current PCP (Earl Lagos, MD) and does need an [facility] hospital follow-up appointment after discharge.  The patient does have transportation limitations that hinder transportation to clinic appointments.  .Services Needed at time of discharge: Y = Yes, Blank = No PT:   OT:   RN:   Equipment:   Other:     LOS: 0 days   Hyacinth Meeker, MD 03/11/2015, 4:12 PM

## 2015-03-11 NOTE — H&P (Addendum)
[facility] History and Physical  Troy Torres ZOX:[id number] DOB: 05-17-1951 DOA: 03/10/2015  Referring physician: Dr.Gentry. PCP: Troy Lagos, MD  Specialists: None.  Chief Complaint: Hematemesis and rectal bleeding.  HPI: Troy Torres is a 64 y.o. male with history of cecal angiodysplasia and previous history of gastric ulcer and his hospital computer chart and our office computer chart was reviewed, seizure disorder and intracranial bleed, diastolic dysfunction presents to the ER because of repeated episodes of hematemesis and rectal bleeding. Patient states he started having rectal bleeding last evening followed by hematemesis. Denies any abdominal pain. Patient states he still drinks alcohol. Patient also takes aspirin and does not take any other medications and has not followed up with his primary care for more than 2 years. Patient is supposed to be on antiseizure medications which he has not taken for a long time. In the ER patient's hemoglobin is around 9 which is a drop from 13 in 2014. Patient has been admitted for acute GI bleed. Patient denies taking any NSAIDs.   Review of Systems: As presented in the history of presenting illness, rest negative.  Past Medical History  Diagnosis Date  . Seizure     generalized major motor seizures // Started after Head injury 2008 // Followed at [facility] by Dr. Sandria Manly  . 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 Iron deficiency anemia due to chronic blood loss     BL Hgb 12-13  . GI bleed   . Antral ulcer 10/2011    Small ulcer noted per Endoscopy (Dr. Madilyn Fireman)  . 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 Madilyn Fireman)  . Esophagitis 10/2011    per EGD (10/2011)  . Alcohol abuse     with history of DTs //  states last drink was 10/2012  . Tobacco abuse   . Chronic diastolic heart failure     grade 2 per 2D echo (12/2012)   Past Surgical History  Procedure Laterality Date  . No past surgeries     Social History:  reports that he has been smoking Cigarettes.  He has a 23 pack-year smoking history. He does not have any smokeless tobacco history on file. He reports that he does not drink alcohol or use illicit drugs. Where does patient live home. Can patient participate in ADLs? Yes.  No Known Allergies  Family History:  Family History  Problem Relation Age of Onset  . Diabetes Mother 12    deceased  . Diabetes Sister   . Heart attack Father 26    deceased  . Heart disease Sister 60      Prior to Admission medications   Medication Sig Start Date End Date Taking? Authorizing Provider  aspirin 81 MG tablet Take 81 mg by mouth daily.   Yes Historical Provider, MD  lisinopril (PRINIVIL,ZESTRIL) 10 MG tablet Take 1 tablet (10 mg total) by mouth daily. Patient not taking: Reported on 03/11/2015 01/29/13 01/29/14  Priscella Mann, DO    Physical Exam: Filed Vitals:   03/10/15 2345 03/11/15 0000 03/11/15 0030 03/11/15 0119  BP: 149/86 181/81 158/89 160/86  Pulse: 88 97 87 89  Temp:    98.7 F (37.1 C)  TempSrc:    Oral  Resp: Height:    5'  8" (1.727 m)  Weight:    66.1 kg (145 lb 11.6 oz)  SpO2: 100% 99% 100% 100%     General:  Moderately built and nourished.  Eyes: Anicteric. No pallor.  ENT: No discharge from the ears eyes nose and mouth.  Neck: No mass felt.  Cardiovascular: S1 and S2 heard.  Respiratory: No rhonchi or crepitations.  Abdomen: Soft nontender bowel sounds present.  Skin: No rash.  Musculoskeletal: No edema.  Psychiatric: Appears normal.  Neurologic: Alert awake oriented to time place and person. Has slight foot drop. Which patient states is chronic.  Labs on Admission:  Basic Metabolic Panel:  Recent Labs Lab  03/10/15 2245  NA 140  K 4.1  CL 100*  CO2 18*  GLUCOSE 108*  BUN 23*  CREATININE 1.24  CALCIUM 9.1   Liver Function Tests:  Recent Labs Lab 03/10/15 2245  AST 40  ALT 18  ALKPHOS 63  BILITOT 0.7  PROT 7.4  ALBUMIN 3.9   No results for input(s): LIPASE, AMYLASE in the last 168 hours. No results for input(s): AMMONIA in the last 168 hours. CBC:  Recent Labs Lab 03/10/15 2245  WBC 7.5  HGB 9.5*  HCT 29.9*  MCV 71.2*  PLT 314   Cardiac Enzymes: No results for input(s): CKTOTAL, CKMB, CKMBINDEX, TROPONINI in the last 168 hours.  BNP (last 3 results) No results for input(s): BNP in the last 8760 hours.  ProBNP (last 3 results) No results for input(s): PROBNP in the last 8760 hours.  CBG: No results for input(s): GLUCAP in the last 168 hours.  Radiological Exams on Admission: No results found.   Assessment/Plan Principal Problem:   Acute GI bleeding Active Problems:   History of seizure disorder   Chronic diastolic heart failure   Acute blood loss anemia   Essential hypertension   1. Acute GI bleed with known history of antral ulcer and cecal AVMs - at this time patient has been placed on Protonix infusion and kept nothing by mouth in anticipation of endoscopy. We will follow CBC closely and if there is drop in hemoglobin less than 7 or if patient becomes hypotensive we will transfuse PRBC. Discontinue aspirin. Patient states he does drink alcohol for which I have placed patient on alcohol withdrawal protocol. I have discussed with Dr. Ewing Schlein, [facility] Dr. Ewing Schlein, GI will be seeing patient in consult. 2. Acute blood loss anemia - follow CBC. 3. History of seizures and intracranial hemorrhage - patient used to be on Depakote. Patient may need to be discharged on Depakote after discussing with neurologist. 4. Chronic diastolic heart failure - presently appears compensated. Closely observe. 5. History of hypertension present not taking any antihypertensives - I  have placed patient on when necessary IV hydralazine and closely observe blood pressure trends. 6. Alcohol abuse - patient is mildly tremulous. I have placed patient on alcohol withdrawal protocol.   DVT Prophylaxis SCDs.  Code Status: Full code.  Family Communication: Discussed with patient.  Disposition Plan: Admit to inpatient.    KAKRAKANDY,ARSHAD N. [facility] Pager [phone].  If 7PM-7AM, please contact night-coverage [email or web address] Password [email or web address] 03/11/2015, 2:12 AM

## 2015-03-12 ENCOUNTER — Encounter (HOSPITAL_COMMUNITY): Payer: Self-pay | Admitting: Gastroenterology

## 2015-03-12 LAB — MAGNESIUM: MAGNESIUM: 1.7 mg/dL (ref 1.7–2.4)

## 2015-03-12 LAB — CBC WITH DIFFERENTIAL/PLATELET
Basophils Absolute: 0 10*3/uL (ref 0.0–0.1)
Basophils Relative: 0 % (ref 0–1)
EOS PCT: 3 % (ref 0–5)
Eosinophils Absolute: 0.2 10*3/uL (ref 0.0–0.7)
HCT: 26.4 % — ABNORMAL LOW (ref 39.0–52.0)
HEMOGLOBIN: 8.4 g/dL — AB (ref 13.0–17.0)
Lymphocytes Relative: 25 % (ref 12–46)
Lymphs Abs: 1.5 10*3/uL (ref 0.7–4.0)
MCH: 23 pg — ABNORMAL LOW (ref 26.0–34.0)
MCHC: 31.8 g/dL (ref 30.0–36.0)
MCV: 72.3 fL — AB (ref 78.0–100.0)
Monocytes Absolute: 0.6 10*3/uL (ref 0.1–1.0)
Monocytes Relative: 10 % (ref 3–12)
Neutro Abs: 3.9 10*3/uL (ref 1.7–7.7)
Neutrophils Relative %: 62 % (ref 43–77)
Platelets: 249 10*3/uL (ref 150–400)
RBC: 3.65 MIL/uL — ABNORMAL LOW (ref 4.22–5.81)
RDW: 17.4 % — AB (ref 11.5–15.5)
WBC: 6.2 10*3/uL (ref 4.0–10.5)

## 2015-03-12 LAB — HIV ANTIBODY (ROUTINE TESTING W REFLEX): HIV Screen 4th Generation wRfx: NONREACTIVE

## 2015-03-12 LAB — APTT: aPTT: 29 seconds (ref 24–37)

## 2015-03-12 LAB — TECHNOLOGIST SMEAR REVIEW

## 2015-03-12 MED ORDER — FOLIC ACID 1 MG PO TABS: 1.0000 mg | ORAL_TABLET | Freq: Every day | ORAL | Status: DC

## 2015-03-12 MED ORDER — FERROUS SULFATE 325 (65 FE) MG PO TABS
325.0000 mg | ORAL_TABLET | Freq: Every day | ORAL | Status: DC
Start: 2015-03-12 — End: 2016-02-13

## 2015-03-12 MED ORDER — PANTOPRAZOLE SODIUM 40 MG PO TBEC: 40.0000 mg | DELAYED_RELEASE_TABLET | Freq: Every day | ORAL | Status: DC

## 2015-03-12 MED ORDER — LISINOPRIL 10 MG PO TABS: 10.0000 mg | ORAL_TABLET | Freq: Every day | ORAL | Status: DC

## 2015-03-12 MED ORDER — BOOST / RESOURCE BREEZE PO LIQD: 1.0000 | Freq: Three times a day (TID) | ORAL | Status: DC

## 2015-03-12 NOTE — Progress Notes (Signed)
Initial Nutrition Assessment  DOCUMENTATION CODES:  Not applicable  INTERVENTION:  Boost Breeze, MVI, Other (Comment) (Vitamin C)  Recommend 120 mg Vitamin C daily  NUTRITION DIAGNOSIS:  Increased nutrient needs related to acute illness, altered GI function as evidenced by severe depletion of muscle mass, estimated needs.   GOAL:  Patient will meet greater than or equal to 90% of their needs   MONITOR:  PO intake, Weight trends, Skin  REASON FOR ASSESSMENT:  Malnutrition Screening Tool    ASSESSMENT: 64 y.o. male with previous history of gastric ulcer, seizure disorder and intracranial bleed, diastolic dysfunction presents to the ER because of repeated episodes of hematemesis and rectal bleeding.  Per MD note, pt "doing fine today. No longer having any hematemesis or emesis, or bloody bowel movement." Pt states that he usually weighs 139 lbs and has lost 10 lbs in the past 6 months due to increased physical activity. He reports having a normal appetite and eating 2 meals daily as usual. He reports eating >75% of breakfast this morning but, per nursing notes pt only ate 50%. He likes the [employer] and drank one this morning. No evidence of weight loss per weight history; however he does have severe muscle wasting in his legs. RD encouraged intake of 3 balanced meals daily. Recommended snacking or consuming a protein/nutritional shake for any skipped meals. Pt continues to smoke cigarettes. RD highly recommended that pt take a daily Vitamin C supplement.   Labs: low hemoglobin, low calcium  Height:  Ht Readings from Last 1 Encounters:  03/11/15 5\' 8"  (1.727 m)    Weight:  Wt Readings from Last 1 Encounters:  03/12/15 146 lb 12.8 oz (66.588 kg)    Ideal Body Weight:  70 kg  Wt Readings from Last 10 Encounters:  03/12/15 146 lb 12.8 oz (66.588 kg)  03/06/13 144 lb 14.4 oz (65.726 kg)  01/29/13 150 lb 1.6 oz (68.085 kg)  01/07/13 153 lb 9.6 oz (69.673 kg)   01/04/13 135 lb (61.236 kg)  04/09/12 144 lb 14.4 oz (65.726 kg)  03/20/12 143 lb 15.4 oz (65.3 kg)  01/22/12 153 lb 4.8 oz (69.536 kg)  01/01/12 153 lb 11.2 oz (69.718 kg)  12/19/11 147 lb 14.4 oz (67.087 kg)    BMI:  Body mass index is 22.33 kg/(m^2).  Estimated Nutritional Needs:  Kcal:  1900-2100  Protein:  80-90 grams  Fluid:  1.8-2L/day  Skin:  Reviewed, no issues  Diet Order:  Diet regular Room service appropriate?: Yes; Fluid consistency:: Thin  EDUCATION NEEDS:  No education needs identified at this time   Intake/Output Summary (Last 24 hours) at 03/12/15 1049 Last data filed at 03/12/15 1000  Gross per 24 hour  Intake 2335.75 ml  Output   1100 ml  Net 1235.75 ml    Last BM:  6/9   Ian Malkin RD, LDN Inpatient Clinical Dietitian Pager: [phone] After Hours Pager: [phone]

## 2015-03-12 NOTE — Progress Notes (Signed)
Patient seen and examined. Case d/w residents in detail. I agree with findings and plan as documented in Dr. Marcelino Freestone note.  Hg is stable and patient has no further episodes of bleeding. EGD with no acute abnormalities. He is stable for d/c home. He will follow up in [facility] and with GI as an outpatient. Will stop ASA and c/w PPI

## 2015-03-12 NOTE — Progress Notes (Signed)
Subjective:  Doing fine today. No longer having any hematemesis or emesis, or bloody bowel movement. No complaints today. Objective: Vital signs in last 24 hours: Filed Vitals:   03/12/15 0541 03/12/15 0954 03/12/15 1144 03/12/15 1400  BP: 101/65 116/73 136/72 112/60  Pulse: 64  70 74  Temp: 98.2 F (36.8 C)   98.5 F (36.9 C)  TempSrc: Oral   Oral  Resp: Height:      Weight: 146 lb 12.8 oz (66.588 kg)     SpO2: 100%   99%   Weight change: 6 lb (2.722 kg)  Intake/Output Summary (Last 24 hours) at 03/12/15 1439 Last data filed at 03/12/15 1300  Gross per 24 hour  Intake 2285.75 ml  Output   1150 ml  Net 1135.75 ml    Vitals reviewed. General: resting in bed, NAD HEENT: PERRL, EOMI, no scleral icterus Cardiac: RRR, no rubs, murmurs or gallops Pulm: clear to auscultation bilaterally, no wheezes, rales, or rhonchi Abd: soft, nontender, nondistended, BS present Ext: warm and well perfused, no pedal edema Neuro: alert and oriented X3, cranial nerves II-XII grossly intact, strength and sensation to light touch equal in bilateral upper and lower extremities  Lab Results: Basic Metabolic Panel:  Recent Labs Lab 03/10/15 2245 03/11/15 0716 03/12/15 0256  NA 140 137  --   K 4.1 4.4  --   CL 100* 102  --   CO2 18* 23  --   GLUCOSE 108* 94  --   BUN 23* 22*  --   CREATININE 1.24 1.18  --   CALCIUM 9.1 8.4*  --   MG  --   --  1.7   Liver Function Tests:  Recent Labs Lab 03/10/15 2245 03/11/15 0716  AST 40 29  ALT 18 15*  ALKPHOS 63 56  BILITOT 0.7 0.9  PROT 7.4 6.2*  ALBUMIN 3.9 3.3*   CBC:  Recent Labs Lab 03/11/15 1132 03/12/15 0256  WBC 6.5 6.2  NEUTROABS  --  3.9  HGB 8.7* 8.4*  HCT 27.5* 26.4*  MCV 72.0* 72.3*  PLT 258 249   Coagulation:  Recent Labs Lab 03/10/15 2245  LABPROT 13.5  INR 1.01   Misc. Labs:  Micro Results: No results found for this or any previous visit (from the past 240 hour(s)). Studies/Results: No  results found. Medications: I have reviewed the patient's current medications. Scheduled Meds: . feeding supplement (RESOURCE BREEZE)  1 Container Oral TID BM  . ferrous sulfate  325 mg Oral Q breakfast  . folic acid  1 mg Oral Daily  . lisinopril  10 mg Oral Daily  . LORazepam  0-4 mg Intravenous 4 times per day   Followed by  . [START ON 03/13/2015] LORazepam  0-4 mg Intravenous Q12H  . multivitamin with minerals  1 tablet Oral Daily  . pantoprazole  40 mg Oral Daily  . thiamine  100 mg Oral Daily   Or  . thiamine  100 mg Intravenous Daily   Continuous Infusions:   PRN Meds:.acetaminophen **OR** acetaminophen, hydrALAZINE, LORazepam **OR** LORazepam, ondansetron **OR** ondansetron (ZOFRAN) IV Assessment/Plan: Principal Problem:   Acute GI bleeding Active Problems:   History of seizure disorder   Chronic diastolic heart failure   Acute blood loss anemia   Essential hypertension  64 yo male with hx of antral erosions and cecal AVMs here with acute GI bleed (presented with bloody emesis and black stool  Acute GI bleed- likely 2/2 to  being on ASA and not protonix + recent alcohol use  - has hx of antral erosion, esophagitis, and cecal AVMs - presented with bloody emesis and black stool.  hgb stable around 8.7. B/l 13.3 on 2014.  Was on PPI drip and NPO and cbc was monitored.- GI saw patient, EGD done, shows tiny small hiatal hernia. No signs of bleeding or other abnormalities. GI recommended oral PPI qdaily. - stopped aspirin as no clear indication (CAD, CVA). for it and the risk of bleeding would outweigh the benefit.  - f/up with Dr. Ewing Schlein outpatient  Acute blood loss anemia on Chronic normocytic Anemia - likely iron deficiency from AVM - last hgb was 13 2 years ago, not sure what new baseline is.  - rechecked anemia panel - low ferritin.  - started PO iron 325mg  daily.  Hx of seizure and intracranial hemorrhage- has not taken any meds for seizure for long time - consider  f/up with neurology outpatient? Saw Dr. Sandria Manly in the past. Will have him follow up.  Hx of HTN - has not taken anything but has lisinopril on the list. -monitor BP. Restarted lisinopril.  Alcohol abuse with hx of DTs -states only drinking small amount of wine occasionally  had mild tremor on admission - on CIWA protocol.appears stable now.   Tobacco abuse - smokes 1ppd, has tried chantix but he does not like it - interested in quitting, stated he will follow up about this in the clinic.  Chronic diastolic heart failure - grade 2 diastolic with normal EF  - volume status appears normal. Cont lisinopril.  Diet: regular Dvt: scd.  Dispo: Disposition is deferred at this time, awaiting improvement of current medical problems.  Anticipated discharge in approximately 1-2 day(s).   The patient does have a current PCP (No Pcp Per Patient) and does need an [facility] hospital follow-up appointment after discharge.  The patient does have transportation limitations that hinder transportation to clinic appointments.  .Services Needed at time of discharge: Y = Yes, Blank = No PT:   OT:   RN:   Equipment:   Other:     LOS: 1 day   Troy Meeker, MD 03/12/2015, 2:39 PM

## 2015-03-12 NOTE — Discharge Instructions (Addendum)
Stop taking aspirin.  Start taking Protonix 40mg  daily (30 minutes before you eat anything).  Start taking Iron tablets daily (in empty stomach with some orange juice)  Start taking Lisinopril 10mg  daily for your blood pressure.  Follow up with your neurologist and also with GI Dr. Ewing Schlein.

## 2015-03-12 NOTE — Discharge Summary (Signed)
Name: Troy Torres MRN: [id number] DOB: March 18, 1951 64 y.o. PCP: No Pcp Per Patient  Date of Admission: 03/10/2015 10:27 PM Date of Discharge: 03/12/2015 Attending Physician: Earl Lagos, MD  Discharge Diagnosis: Principal Problem:   Acute GI bleeding Active Problems:   History of seizure disorder   Chronic diastolic heart failure   Acute blood loss anemia   Essential hypertension  Discharge Medications:   Medication List    STOP taking these medications        aspirin 81 MG tablet      TAKE these medications        feeding supplement (RESOURCE BREEZE) Liqd  Take 1 Container by mouth 3 (three) times daily between meals.     ferrous sulfate 325 (65 FE) MG tablet  Take 1 tablet (325 mg total) by mouth daily with breakfast.     folic acid 1 MG tablet  Commonly known as:  FOLVITE  Take 1 tablet (1 mg total) by mouth daily.     lisinopril 10 MG tablet  Commonly known as:  PRINIVIL,ZESTRIL  Take 1 tablet (10 mg total) by mouth daily.     pantoprazole 40 MG tablet  Commonly known as:  PROTONIX  Take 1 tablet (40 mg total) by mouth daily.        Disposition and follow-up:   [name] was discharged from [facility] in Stable condition.  At the hospital follow up visit please address:  1.  Came in with Acute GI bleed likely from being on ASA and not Protonix with hx of AVM. Assess for compliance with protonix 40mg  daily. Asked to stop taking aspirin.  Should follow up with Dr. Ewing Schlein GI outpatient (scheduled appt).   Asked to start PO iron. Should check CBC in few weeks  Resume/restarted Lisinopril 10mg  daily. He did not know what he was taking, this was on his list.  Needs Neuro referral (has hx of seizure, not on any meds, last seizure 4 years ago).  Patient interested to discuss tobacco cessation on follow up.  2.  Labs / imaging needed at time of follow-up: CBC in few weeks to monitor response with Iron.  3.  Pending labs/ test  needing follow-up:   Follow-up Appointments: Follow-up Information    Follow up with [facility], MD. Go on 03/29/2015.   Specialty:  Gastroenterology   Why:  @ 11:00 AM   Contact information:   [address] [place] [address] [phone]       Follow up with Gust Rung, DO On 03/22/2015.   Specialty:  Internal Medicine   Why:  @10 :45 am.   Contact information:   [address] [place] [address] [phone]       Discharge Instructions:   Consultations: Treatment Team:  Vida Rigger, MD  Procedures Performed:  No results found.   ENDOSCOPIC IMPRESSION: 1.tiny to small hiatal hernia 2. Rubber band in the stomach status post removed with rat tooth 3. Otherwise within normal limits EGD without signs of bleeding or other abnormalities   Admission HPI:   Troy Torres is a 64 y.o. male with history of cecal angiodysplasia and previous history of gastric ulcer and his hospital computer chart and our office computer chart was reviewed, seizure disorder and intracranial bleed, diastolic dysfunction presents to the ER because of repeated episodes of hematemesis and rectal bleeding. Patient states he started having rectal bleeding last evening followed by hematemesis. Denies any abdominal pain.  Patient states he still drinks alcohol. Patient also takes aspirin and does not take any other medications and has not followed up with his primary care for more than 2 years. Patient is supposed to be on antiseizure medications which he has not taken for a long time. In the ER patient's hemoglobin is around 9 which is a drop from 13 in 2014. Patient has been admitted for acute GI bleed. Patient denies taking any NSAIDs.    Hospital Course by problem list:   64 yo male with hx of antral erosions and cecal AVMs here with acute GI bleed (presented with bloody emesis and black stool  Acute GI bleed- likely 2/2 to being on ASA and not protonix + recent alcohol use   - has hx of antral erosion, esophagitis, and cecal AVMs - presented with bloody emesis and black stool. hgb stable around 8.7. B/l 13.3 on 2014.  Was on PPI drip and NPO and cbc was monitored.- GI saw patient, EGD done, shows tiny small hiatal hernia. No signs of bleeding or other abnormalities. GI recommended oral PPI qdaily. - stopped aspirin as no clear indication (CAD, CVA). for it and the risk of bleeding would outweigh the benefit.  - f/up with Dr. Ewing Schlein outpatient  Acute blood loss anemia on Chronic normocytic Anemia - likely iron deficiency from AVM - last hgb was 13 2 years ago, not sure what new baseline is.  - rechecked anemia panel - low ferritin.  - started PO iron  daily. Needs CBC follow up in few weeks outpatient for response.  Hx of seizure and intracranial hemorrhage- has not taken any meds for seizure for long time - consider f/up with neurology outpatient? Saw Dr. Sandria Manly in the past. Needs outpatient referral.  Hx of HTN - has not taken anything but has lisinopril on the list. -monitor BP. Restarted lisinopril.  Alcohol abuse with hx of DTs -states only drinking small amount of wine occasionally had mild tremor on admission - was on CIWA protocol.appears stable now, stable for discharge.  Tobacco abuse - smokes 1ppd, has tried chantix but he does not like it - interested in quitting, stated he will follow up about this in the clinic.  Chronic diastolic heart failure - grade 2 diastolic with normal EF - volume status appears normal. Cont lisinopril.  Discharge Vitals:   BP 112/60 mmHg  Pulse 74  Temp(Src) 98.5 F (36.9 C) (Oral)  Resp 18  Ht  (1.727 m)  Wt 146 lb 12.8 oz (66.588 kg)  BMI 22.33 kg/m2  SpO2 99%  Discharge Labs:  Results for orders placed or performed during the hospital encounter of 03/10/15 (from the past 24 hour(s))  Iron and TIBC     Status: Abnormal   Collection Time: 03/11/15  8:44 PM  Result Value Ref Range   Iron  145 45 - 182 ug/dL   TIBC 782 956 - 213 ug/dL   Saturation Ratios 40 (H) 17.9 - 39.5 %   UIBC 220 ug/dL  Ferritin     Status: None   Collection Time: 03/11/15  8:44 PM  Result Value Ref Range   Ferritin 24 24 - 336 ng/mL  CBC with Differential/Platelet     Status: Abnormal   Collection Time: 03/12/15  2:56 AM  Result Value Ref Range   WBC 6.2 4.0 - 10.5 K/uL   RBC 3.65 (L) 4.22 - 5.81 MIL/uL   Hemoglobin 8.4 (L) 13.0 - 17.0 g/dL   HCT 08.6 (L) 57.8 -  52.0 %   MCV 72.3 (L) 78.0 - 100.0 fL   MCH 23.0 (L) 26.0 - 34.0 pg   MCHC 31.8 30.0 - 36.0 g/dL   RDW 16.1 (H) 09.6 - 04.5 %   Platelets 249 150 - 400 K/uL   Neutrophils Relative % 62 43 - 77 %   Neutro Abs 3.9 1.7 - 7.7 K/uL   Lymphocytes Relative 25 12 - 46 %   Lymphs Abs 1.5 0.7 - 4.0 K/uL   Monocytes Relative 10 3 - 12 %   Monocytes Absolute 0.6 0.1 - 1.0 K/uL   Eosinophils Relative 3 0 - 5 %   Eosinophils Absolute 0.2 0.0 - 0.7 K/uL   Basophils Relative 0 0 - 1 %   Basophils Absolute 0.0 0.0 - 0.1 K/uL  Technologist smear review     Status: None   Collection Time: 03/12/15  2:56 AM  Result Value Ref Range   Tech Review ELLIPTOCYTES   HIV antibody     Status: None   Collection Time: 03/12/15  2:56 AM  Result Value Ref Range   HIV Screen 4th Generation wRfx Non Reactive Non Reactive  Magnesium     Status: None   Collection Time: 03/12/15  2:56 AM  Result Value Ref Range   Magnesium 1.7 1.7 - 2.4 mg/dL  APTT     Status: None   Collection Time: 03/12/15  2:56 AM  Result Value Ref Range   aPTT 29 24 - 37 seconds    Signed: Hyacinth Meeker, MD 03/12/2015, 2:50 PM    Services Ordered on Discharge:  Equipment Ordered on Discharge:

## 2015-03-12 NOTE — Progress Notes (Signed)
Foley cath. D/c as ordered.  Pt tolerated well.  Will continue to monitor.  Amanda Pea, Charity fundraiser.

## 2015-03-12 NOTE — Progress Notes (Signed)
UR COMPLETED  

## 2015-03-12 NOTE — Progress Notes (Signed)
Patient ID: Troy Torres, male   DOB: 19-Oct-1950, 64 y.o.   MRN: [id number] [name] Gastroenterology Progress Note  Troy Torres 64 y.o. April 08, 1951   Subjective: Feels good. Denies any further hematemesis and no vomiting. Denies rectal bleeding or abdominal pain.  Objective: Vital signs in last 24 hours: Filed Vitals:   03/12/15 0954  BP: 116/73  Pulse: 64  Temp: 98.2  Resp: 20    Physical Exam: Gen: alert, no acute distress CV: RRR Chest: CTA B Abd: soft, nontender, nondistended, +BS Ext: pulses intact, no edema   Lab Results:  Recent Labs  03/10/15 2245 03/11/15 0716 03/12/15 0256  NA 140 137  --   K 4.1 4.4  --   CL 100* 102  --   CO2 18* 23  --   GLUCOSE 108* 94  --   BUN 23* 22*  --   CREATININE 1.24 1.18  --   CALCIUM 9.1 8.4*  --   MG  --   --  1.7    Recent Labs  03/10/15 2245 03/11/15 0716  AST 40 29  ALT 18 15*  ALKPHOS 63 56  BILITOT 0.7 0.9  PROT 7.4 6.2*  ALBUMIN 3.9 3.3*    Recent Labs  03/11/15 1132 03/12/15 0256  WBC 6.5 6.2  NEUTROABS  --  3.9  HGB 8.7* 8.4*  HCT 27.5* 26.4*  MCV 72.0* 72.3*  PLT 258 249    Recent Labs  03/10/15 2245  LABPROT 13.5  INR 1.01      Assessment/Plan: S/P GI bleed with EGD yesterday that showed a small hiatal hernia and a rubberband was seen in his stomach and removed. Hgb stable. No further bleeding. Has a history of colonic AVMs. Suspect recent alcohol and aspirin use without a PPI contributed to his upper GI bleed. Would advance diet. PPI PO QD as outpt. Defer use of aspirin to primary team. F/U with Dr. Ewing Schlein in 3-4 weeks to decide on whether to update his colonoscopy. Will sign off. Call if questions.   Rissa Turley C. 03/12/2015, 11:20 AM  Pager [phone]  If no answer or after 5 PM call [phone]

## 2015-03-13 LAB — HCV COMMENT:

## 2015-03-15 LAB — HEPATITIS C ANTIBODY (REFLEX): HCV Ab: <0.1 s/co ratio (ref 0.0–0.9)

## 2015-03-22 ENCOUNTER — Ambulatory Visit: Payer: Medicare Other | Admitting: Internal Medicine

## 2015-04-16 ENCOUNTER — Emergency Department (HOSPITAL_COMMUNITY)
Admission: EM | Admit: 2015-04-16 | Discharge: 2015-04-16 | Disposition: A | Discharge status: Home / Self Care | Payer: Medicare Other | Attending: Emergency Medicine | Admitting: Emergency Medicine

## 2015-04-16 ENCOUNTER — Emergency Department (HOSPITAL_COMMUNITY): Payer: Medicare Other

## 2015-04-16 ENCOUNTER — Encounter (HOSPITAL_COMMUNITY): Payer: Self-pay | Admitting: Emergency Medicine

## 2015-04-16 DIAGNOSIS — D649 Anemia, unspecified: Secondary | ICD-10-CM | POA: Diagnosis not present

## 2015-04-16 DIAGNOSIS — S0121XA Laceration without foreign body of nose, initial encounter: Secondary | ICD-10-CM | POA: Insufficient documentation

## 2015-04-16 DIAGNOSIS — Y939 Activity, unspecified: Secondary | ICD-10-CM | POA: Insufficient documentation

## 2015-04-16 DIAGNOSIS — S199XXA Unspecified injury of neck, initial encounter: Secondary | ICD-10-CM | POA: Diagnosis not present

## 2015-04-16 DIAGNOSIS — Y999 Unspecified external cause status: Secondary | ICD-10-CM | POA: Insufficient documentation

## 2015-04-16 DIAGNOSIS — Z79899 Other long term (current) drug therapy: Secondary | ICD-10-CM | POA: Diagnosis not present

## 2015-04-16 DIAGNOSIS — Z8719 Personal history of other diseases of the digestive system: Secondary | ICD-10-CM | POA: Insufficient documentation

## 2015-04-16 DIAGNOSIS — W19XXXA Unspecified fall, initial encounter: Secondary | ICD-10-CM

## 2015-04-16 DIAGNOSIS — S0990XA Unspecified injury of head, initial encounter: Secondary | ICD-10-CM | POA: Diagnosis present

## 2015-04-16 DIAGNOSIS — S0181XA Laceration without foreign body of other part of head, initial encounter: Secondary | ICD-10-CM

## 2015-04-16 DIAGNOSIS — I5032 Chronic diastolic (congestive) heart failure: Secondary | ICD-10-CM | POA: Diagnosis not present

## 2015-04-16 DIAGNOSIS — W1839XA Other fall on same level, initial encounter: Secondary | ICD-10-CM | POA: Insufficient documentation

## 2015-04-16 DIAGNOSIS — Z720 Tobacco use: Secondary | ICD-10-CM | POA: Insufficient documentation

## 2015-04-16 DIAGNOSIS — Y929 Unspecified place or not applicable: Secondary | ICD-10-CM | POA: Insufficient documentation

## 2015-04-16 LAB — BASIC METABOLIC PANEL
Anion gap: 12 (ref 5–15)
BUN: 27 mg/dL — AB (ref 6–20)
CHLORIDE: 100 mmol/L — AB (ref 101–111)
CO2: 20 mmol/L — ABNORMAL LOW (ref 22–32)
Calcium: 9 mg/dL (ref 8.9–10.3)
Creatinine, Ser: 1.39 mg/dL — ABNORMAL HIGH (ref 0.61–1.24)
GFR calc non Af Amer: 52 mL/min — ABNORMAL LOW (ref >60)
Glucose, Bld: 109 mg/dL — ABNORMAL HIGH (ref 65–99)
Potassium: 3.7 mmol/L (ref 3.5–5.1)
SODIUM: 132 mmol/L — AB (ref 135–145)

## 2015-04-16 LAB — CBC
HEMATOCRIT: 33.7 % — AB (ref 39.0–52.0)
Hemoglobin: 11 g/dL — ABNORMAL LOW (ref 13.0–17.0)
MCH: 23.9 pg — ABNORMAL LOW (ref 26.0–34.0)
MCHC: 32.6 g/dL (ref 30.0–36.0)
MCV: 73.3 fL — ABNORMAL LOW (ref 78.0–100.0)
Platelets: 185 10*3/uL (ref 150–400)
RBC: 4.6 MIL/uL (ref 4.22–5.81)
RDW: 21 % — ABNORMAL HIGH (ref 11.5–15.5)
WBC: 5.5 10*3/uL (ref 4.0–10.5)

## 2015-04-16 LAB — I-STAT TROPONIN, ED: Troponin i, poc: 0.01 ng/mL (ref 0.00–0.08)

## 2015-04-16 MED ORDER — SODIUM CHLORIDE 0.9 % IV BOLUS (SEPSIS)
500.0000 mL | Freq: Once | INTRAVENOUS | Status: AC
  Administered 2015-04-16: 500 mL via INTRAVENOUS

## 2015-04-16 NOTE — ED Notes (Signed)
Bed: ZO10WA05 Expected date:  Expected time:  Means of arrival:  Comments: EMS 64 yo fall at home/lac to forehead and bridge of nose-states positive LOC

## 2015-04-16 NOTE — ED Notes (Signed)
Patient here from home with complaints of fall, head injury. Lacerations to head and nose. Witness fall with LOC. Hx seizures.

## 2015-04-16 NOTE — Discharge Instructions (Signed)
If you were given medicines take as directed.  If you are on coumadin or contraceptives realize their levels and effectiveness is altered by many different medicines.  If you have any reaction (rash, tongues swelling, other) to the medicines stop taking and see a physician.    If your blood pressure was elevated in the ER make sure you follow up for management with a primary doctor or return for chest pain, shortness of breath or stroke symptoms.  Please follow up as directed and return to the ER or see a physician for new or worsening symptoms.  Thank you. Filed Vitals:   04/16/15 0733 04/16/15 0951  BP: 113/71 119/67  Pulse: 82 78  Temp: 97.7 F (36.5 C) 97.8 F (36.6 C)  TempSrc: Oral Oral  Resp: 18 16  SpO2: 100% 100%

## 2015-04-16 NOTE — ED Provider Notes (Signed)
CSN: [id number]     Arrival date & time 04/16/15  4098 History   First MD Initiated Contact with Patient 04/16/15 [phone]     Chief Complaint  Patient presents with  . Fall  . Laceration  . Head Injury     (Consider location/radiation/quality/duration/timing/severity/associated sxs/prior Treatment) HPI Comments: 64 year old male with history of alcohol use, iron deficiency anemia, gastric ulcer, recent GI bleeding from ulcer/AV malformation, tobacco use, head trauma with intracerebral bleed 2008, patient has not taken seizure medications for 3 years presents after head injury. Patient was walking and states that he slipped on the floor causing him to fall and hit the front face with mild bleeding and laceration. Patient feels possible brief loss of consciousness, friend Kathlene November was with him and did not report. Seizure activity. Patient drinks wine once a week, no known alcohol withdrawal seizures. Patient currently has no symptoms except for facial laceration and neck pain. No weakness or numbness in arms or legs. Pain with palpation of neck c-collar in place  Patient is a 64 y.o. male presenting with fall, skin laceration, and head injury. The history is provided by the patient.  Fall Pertinent negatives include no chest pain, no abdominal pain, no headaches and no shortness of breath.  Laceration Head Injury Associated symptoms: neck pain   Associated symptoms: no headaches and no vomiting     Past Medical History  Diagnosis Date  . Seizure     generalized major motor seizures // Started after Head injury 2008 // Followed at [facility] by Dr. Sandria Manly  . 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 Iron deficiency anemia due to chronic blood loss     BL Hgb 12-13  . Antral ulcer 10/2011    Small ulcer noted per  Endoscopy (Dr. Madilyn Fireman)  . 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 Madilyn Fireman)  . Esophagitis 10/2011    per EGD (10/2011)  . Alcohol abuse     with history of DTs // states last drink was 10/2012  . Tobacco abuse   . Chronic diastolic heart failure     grade 2 per 2D echo (12/2012)  . History of blood product transfusion ~ 2014    LGIB  . Lower GI bleeding 2014   Past Surgical History  Procedure Laterality Date  . Cataract extraction w/ intraocular lens  implant, bilateral Bilateral ~ 2014  . Esophagogastroduodenoscopy N/A 03/11/2015    Procedure: ESOPHAGOGASTRODUODENOSCOPY (EGD);  Surgeon: Vida Rigger, MD;  Location: [facility];  Service: Endoscopy;  Laterality: N/A;   Family History  Problem Relation Age of Onset  . Diabetes Mother 42    deceased  . Diabetes Sister   . Heart attack Father 44    deceased  . Heart disease Sister 37   History  Substance Use Topics  . Smoking status: Current Every Day Smoker -- 1.00 packs/day for 46 years    Types: Cigarettes  . Smokeless tobacco: Never Used  . Alcohol Use: 6.0 oz/week    10 Shots of liquor per week     Comment: 03/11/2015 pt states he quit 10/2012; son states he's "drinking 1/2 gallon of alcohol, vodka, per 3 weeks"    Review of Systems  Constitutional: Negative for fever and chills.  HENT: Negative for congestion.  Eyes: Negative for visual disturbance.  Respiratory: Negative for shortness of breath.   Cardiovascular: Negative for chest pain.  Gastrointestinal: Negative for vomiting and abdominal pain.  Genitourinary: Negative for dysuria and flank pain.  Musculoskeletal: Positive for neck pain. Negative for back pain and neck stiffness.  Skin: Positive for wound. Negative for rash.  Neurological: Positive for syncope (no cardiac history possible syncope brief per patient if syncope). Negative for light-headedness and headaches.      Allergies  Review of patient's  allergies indicates no known allergies.  Home Medications   Prior to Admission medications   Medication Sig Start Date End Date Taking? Authorizing Provider  ferrous sulfate 325 (65 FE) MG tablet Take 1 tablet (325 mg total) by mouth daily with breakfast. 03/12/15  Yes Tasrif Ahmed, MD  folic acid (FOLVITE) 1 MG tablet Take 1 tablet (1 mg total) by mouth daily. 03/12/15  Yes Tasrif Ahmed, MD  lisinopril (PRINIVIL,ZESTRIL) 10 MG tablet Take 1 tablet (10 mg total) by mouth daily. 03/12/15 03/11/16 Yes Tasrif Ahmed, MD  pantoprazole (PROTONIX) 40 MG tablet Take 1 tablet (40 mg total) by mouth daily. 03/12/15  Yes Tasrif Ahmed, MD  feeding supplement, RESOURCE BREEZE, (RESOURCE BREEZE) LIQD Take 1 Container by mouth 3 (three) times daily between meals. Patient not taking: Reported on 04/16/2015 03/12/15   Tasrif Ahmed, MD   BP 119/67 mmHg  Pulse 78  Temp(Src) 97.8 F (36.6 C) (Oral)  Resp 16  SpO2 100% Physical Exam  Constitutional: He is oriented to person, place, and time. He appears well-developed and well-nourished.  HENT:  Head: Normocephalic.  1 cm lac times to nasal bridge, bleeding controlled, midline nasal, no facial deformities   Eyes: Conjunctivae are normal. Right eye exhibits no discharge. Left eye exhibits no discharge.  Neck: Normal range of motion. Neck supple. No tracheal deviation present.  Cardiovascular: Normal rate and regular rhythm.   Pulmonary/Chest: Effort normal and breath sounds normal.  Abdominal: Soft. He exhibits no distension. There is no tenderness. There is no guarding.  Musculoskeletal: He exhibits no edema.  Mild midline mid cervical and lower thoracic tenderness no lumbar tenderness c-collar in place. Patient moves elbows shoulders hips and knees without discomfort 5+ strength bilateral upper lower extremities  Neurological: He is alert and oriented to person, place, and time.  Skin: Skin is warm. No rash noted.  Psychiatric: He has a normal mood and affect.   Nursing note and vitals reviewed.   ED Course  Procedures (including critical care time) LACERATION REPAIR Performed by: Enid Skeens Authorized by: Enid Skeens Consent: Verbal consent obtained. Risks and benefits: risks, benefits and alternatives were discussed Consent given by: patient Patient identity confirmed: provided demographic data Prepped and Draped in normal sterile fashion Wound explored  Laceration Location: nasal Laceration Length: 1cm No Foreign Bodies seen or palpated  Dermabond  Patient tolerance: Patient tolerated the procedure well with no immediate complications.   Labs Review Labs Reviewed  BASIC METABOLIC PANEL - Abnormal; Notable for the following:    Sodium 132 (*)    Chloride 100 (*)    CO2 20 (*)    Glucose, Bld 109 (*)    BUN 27 (*)    Creatinine, Ser 1.39 (*)    GFR calc non Af Amer 52 (*)    All other components within normal limits  CBC - Abnormal; Notable for the following:    Hemoglobin 11.0 (*)    HCT 33.7 (*)    MCV 73.3 (*)  MCH 23.9 (*)    RDW 21.0 (*)    All other components within normal limits  Rosezena SensorI-STAT TROPOININ, ED    Imaging Review Dg Thoracic Spine 2 View  04/16/2015   CLINICAL DATA:  Fall.  Initial evaluation .  EXAM: THORACIC SPINE - 2-3 VIEWS  COMPARISON:  None.  FINDINGS: Paraspinal soft tissues are normal. Diffuse degenerative change. No acute abnormality. Degenerative changes are most prominent at the along the lower thoracic spine.  IMPRESSION: No acute abnormality.  Diffuse thoracic spine degenerative changes.   Electronically Signed   By: Maisie Fushomas  Register   On: 04/16/2015 08:37   Ct Head Wo Contrast  04/16/2015   CLINICAL DATA:  Larey SeatFell today.  Hit head.  Head and neck pain.  EXAM: CT HEAD WITHOUT CONTRAST  CT CERVICAL SPINE WITHOUT CONTRAST  TECHNIQUE: Multidetector CT imaging of the head and cervical spine was performed following the standard protocol without intravenous contrast. Multiplanar CT image  reconstructions of the cervical spine were also generated.  COMPARISON:  01/03/2013  FINDINGS: CT HEAD FINDINGS  Evidence of remote trauma with bifrontal encephalomalacia. The ventricles are in the midline without mass effect or shift. Stable mild ventriculomegaly and significant periventricular white matter disease. No extra-axial fluid collections. The brainstem and cerebellum are grossly normal.  No acute skull fracture. The paranasal sinuses and mastoid air cells are clear. Acute nasal bone fractures are demonstrated on the right side.  CT CERVICAL SPINE FINDINGS  Moderate degenerative cervical spondylosis with multilevel disc disease and facet disease. No acute cervical spine fracture. The alignment is normal. No abnormal prevertebral soft tissue swelling. Moderate facet disease but no facet or laminar fractures. The lung apices are clear.  IMPRESSION: 1. No acute intracranial findings. Remote posttraumatic changes with bifrontal encephalomalacia. Stable cerebral atrophy, ventriculomegaly and periventricular white matter disease. 2. Degenerative cervical spondylosis but no acute cervical spine fracture.   Electronically Signed   By: Rudie MeyerP.  Gallerani M.D.   On: 04/16/2015 09:37   Ct Cervical Spine Wo Contrast  04/16/2015   CLINICAL DATA:  Larey SeatFell today.  Hit head.  Head and neck pain.  EXAM: CT HEAD WITHOUT CONTRAST  CT CERVICAL SPINE WITHOUT CONTRAST  TECHNIQUE: Multidetector CT imaging of the head and cervical spine was performed following the standard protocol without intravenous contrast. Multiplanar CT image reconstructions of the cervical spine were also generated.  COMPARISON:  01/03/2013  FINDINGS: CT HEAD FINDINGS  Evidence of remote trauma with bifrontal encephalomalacia. The ventricles are in the midline without mass effect or shift. Stable mild ventriculomegaly and significant periventricular white matter disease. No extra-axial fluid collections. The brainstem and cerebellum are grossly normal.  No  acute skull fracture. The paranasal sinuses and mastoid air cells are clear. Acute nasal bone fractures are demonstrated on the right side.  CT CERVICAL SPINE FINDINGS  Moderate degenerative cervical spondylosis with multilevel disc disease and facet disease. No acute cervical spine fracture. The alignment is normal. No abnormal prevertebral soft tissue swelling. Moderate facet disease but no facet or laminar fractures. The lung apices are clear.  IMPRESSION: 1. No acute intracranial findings. Remote posttraumatic changes with bifrontal encephalomalacia. Stable cerebral atrophy, ventriculomegaly and periventricular white matter disease. 2. Degenerative cervical spondylosis but no acute cervical spine fracture.   Electronically Signed   By: Rudie MeyerP.  Gallerani M.D.   On: 04/16/2015 09:37     EKG Interpretation   Date/Time:  Friday April 16 2015 08:43:07 EDT Ventricular Rate:  82 PR Interval:    QRS Duration:  82 QT Interval:  400 QTC Calculation: 467 R Axis:   73 Text Interpretation:  Poor baseline.   Confirmed by Jodi Mourning  MD, [employer]  ([id number]) on 04/16/2015 9:29:39 AM      MDM   Final diagnoses:  Fall, initial encounter  Facial laceration, initial encounter  Anemia, unspecified anemia type   Patient presents after fall, likely mechanical however brief syncope, no witnessed seizures per report. Vital signs normal, no signs clinically of significant withdrawal. With history of intracerebral bleed plan for CT scan of the head and neck. X-rays pending, blood work pending with recent GI bleed.  Pt observed in ED, well appearing on recheck, clinically nasal fracture. Stable for outpt fup, friend with patient for dc.   CT results reviewed, no bleeding or fx.   Results and differential diagnosis were discussed with the patient/parent/guardian. Xrays were independently reviewed by myself.  Close follow up outpatient was discussed, comfortable with the plan.   Medications  sodium chloride 0.9 % bolus  500 mL (500 mLs Intravenous New Bag/Given 04/16/15 0810)    Filed Vitals:   04/16/15 0733 04/16/15 0951  BP: 113/71 119/67  Pulse: 82 78  Temp: 97.7 F (36.5 C) 97.8 F (36.6 C)  TempSrc: Oral Oral  Resp: 18 16  SpO2: 100% 100%    Final diagnoses:  Fall, initial encounter  Facial laceration, initial encounter  Anemia, unspecified anemia type      Blane Ohara, MD 04/16/15 1047

## 2016-01-07 ENCOUNTER — Other Ambulatory Visit: Payer: Self-pay | Admitting: Internal Medicine

## 2016-01-17 ENCOUNTER — Emergency Department (HOSPITAL_COMMUNITY)
Admission: EM | Admit: 2016-01-17 | Discharge: 2016-01-17 | Disposition: A | Discharge status: Home / Self Care | Payer: Medicare Other | Attending: Emergency Medicine | Admitting: Emergency Medicine

## 2016-01-17 ENCOUNTER — Encounter (HOSPITAL_COMMUNITY): Payer: Self-pay | Admitting: Emergency Medicine

## 2016-01-17 DIAGNOSIS — K2970 Gastritis, unspecified, without bleeding: Secondary | ICD-10-CM | POA: Diagnosis not present

## 2016-01-17 DIAGNOSIS — D500 Iron deficiency anemia secondary to blood loss (chronic): Secondary | ICD-10-CM | POA: Diagnosis not present

## 2016-01-17 DIAGNOSIS — F17210 Nicotine dependence, cigarettes, uncomplicated: Secondary | ICD-10-CM | POA: Diagnosis not present

## 2016-01-17 DIAGNOSIS — I5032 Chronic diastolic (congestive) heart failure: Secondary | ICD-10-CM | POA: Diagnosis not present

## 2016-01-17 DIAGNOSIS — Z79899 Other long term (current) drug therapy: Secondary | ICD-10-CM | POA: Diagnosis not present

## 2016-01-17 DIAGNOSIS — R1013 Epigastric pain: Secondary | ICD-10-CM | POA: Diagnosis present

## 2016-01-17 LAB — CBC WITH DIFFERENTIAL/PLATELET
BASOS ABS: 0 10*3/uL (ref 0.0–0.1)
Basophils Relative: 0 %
EOS ABS: 0 10*3/uL (ref 0.0–0.7)
Eosinophils Relative: 0 %
HEMATOCRIT: 38.1 % — AB (ref 39.0–52.0)
Hemoglobin: 13.2 g/dL (ref 13.0–17.0)
LYMPHS ABS: 0.8 10*3/uL (ref 0.7–4.0)
Lymphocytes Relative: 7 %
MCH: 29.9 pg (ref 26.0–34.0)
MCHC: 34.6 g/dL (ref 30.0–36.0)
MCV: 86.4 fL (ref 78.0–100.0)
MONO ABS: 0.4 10*3/uL (ref 0.1–1.0)
Monocytes Relative: 4 %
NEUTROS ABS: 9.6 10*3/uL — AB (ref 1.7–7.7)
Neutrophils Relative %: 89 %
PLATELETS: 111 10*3/uL — AB (ref 150–400)
RBC: 4.41 MIL/uL (ref 4.22–5.81)
RDW: 14.6 % (ref 11.5–15.5)
WBC: 10.8 10*3/uL — ABNORMAL HIGH (ref 4.0–10.5)

## 2016-01-17 LAB — COMPREHENSIVE METABOLIC PANEL
ALBUMIN: 4.3 g/dL (ref 3.5–5.0)
ALT: 43 U/L (ref 17–63)
ANION GAP: 18 — AB (ref 5–15)
AST: 62 U/L — ABNORMAL HIGH (ref 15–41)
Alkaline Phosphatase: 61 U/L (ref 38–126)
BILIRUBIN TOTAL: 1.2 mg/dL (ref 0.3–1.2)
BUN: 39 mg/dL — ABNORMAL HIGH (ref 6–20)
CO2: 26 mmol/L (ref 22–32)
Calcium: 9.5 mg/dL (ref 8.9–10.3)
Chloride: 88 mmol/L — ABNORMAL LOW (ref 101–111)
Creatinine, Ser: 1.45 mg/dL — ABNORMAL HIGH (ref 0.61–1.24)
GFR calc Af Amer: 57 mL/min — ABNORMAL LOW (ref >60)
GFR calc non Af Amer: 49 mL/min — ABNORMAL LOW (ref >60)
GLUCOSE: 179 mg/dL — AB (ref 65–99)
POTASSIUM: 4 mmol/L (ref 3.5–5.1)
SODIUM: 132 mmol/L — AB (ref 135–145)
TOTAL PROTEIN: 8.1 g/dL (ref 6.5–8.1)

## 2016-01-17 LAB — ETHANOL: Alcohol, Ethyl (B): <5 mg/dL (ref <5)

## 2016-01-17 LAB — TROPONIN I: Troponin I: 0.05 ng/mL — ABNORMAL HIGH (ref <0.031)

## 2016-01-17 LAB — LIPASE, BLOOD: Lipase: 171 U/L — ABNORMAL HIGH (ref 11–51)

## 2016-01-17 MED ORDER — SODIUM CHLORIDE 0.9 % IV BOLUS (SEPSIS)
1000.0000 mL | Freq: Once | INTRAVENOUS | Status: AC
  Administered 2016-01-17: 1000 mL via INTRAVENOUS

## 2016-01-17 MED ORDER — ONDANSETRON HCL 4 MG/2ML IJ SOLN
4.0000 mg | Freq: Once | INTRAMUSCULAR | Status: AC
  Administered 2016-01-17: 4 mg via INTRAVENOUS
  Filled 2016-01-17: qty 2

## 2016-01-17 MED ORDER — PANTOPRAZOLE SODIUM 40 MG IV SOLR
40.0000 mg | Freq: Once | INTRAVENOUS | Status: AC
  Administered 2016-01-17: 40 mg via INTRAVENOUS
  Filled 2016-01-17: qty 40

## 2016-01-17 MED ORDER — OMEPRAZOLE 20 MG PO CPDR: 20.0000 mg | DELAYED_RELEASE_CAPSULE | Freq: Two times a day (BID) | ORAL | Status: DC

## 2016-01-17 MED ORDER — LORAZEPAM 2 MG/ML IJ SOLN
1.0000 mg | INTRAMUSCULAR | Status: DC | PRN
  Administered 2016-01-17: 1 mg via INTRAVENOUS
  Filled 2016-01-17: qty 1

## 2016-01-17 NOTE — ED Notes (Signed)
Dr. Leotis at bedside  

## 2016-01-17 NOTE — Discharge Instructions (Signed)

## 2016-01-17 NOTE — ED Provider Notes (Signed)
CSN: [id number]     Arrival date & time 01/17/16  4098 History   First MD Initiated Contact with Patient 01/17/16 [phone]     Chief Complaint  Patient presents with  . Chest Pain  . GI Bleeding      HPI She presents for evaluation of epigastric pain. Has frequent episodes of abdominal pain. He has been noncompliant with his proton pump inhibitor for the last several weeks. He states "my prescription ran out". Continues to drink one or 2 days per week. Had some alcohol Wednesday and Saturday. Denies any yes or today. Vomited once today and it was dark. No hematemesis or melanotic stools. Not lightheaded or dizzy. No chest pain shortness of breath.  Echo 12/2012:  Impressions: - Normal LV size with mild LV hypertrophy, EF 55%. Moderate diastolic dysfunction. Normal RV size and systolic function. No significant valvular abnormalities.  Past Medical History  Diagnosis Date  . Seizure [facility])     generalized major motor seizures // Started after Head injury 2008 // Followed at [facility] by Dr. Sandria Manly  . 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 Iron deficiency anemia due to chronic blood loss     BL Hgb 12-13  . Antral ulcer 10/2011    Small ulcer noted per Endoscopy (Dr. Madilyn Fireman)  . 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 Madilyn Fireman)  . Esophagitis 10/2011    per EGD (10/2011)  . Alcohol abuse     with history of DTs // states last drink was 10/2012  . Tobacco abuse   . Chronic diastolic heart failure (HCC)     grade 2 per 2D echo (12/2012)  . History of blood product transfusion ~ 2014    LGIB  . Lower GI bleeding 2014   Past Surgical History  Procedure Laterality Date  . Cataract extraction w/ intraocular lens  implant, bilateral Bilateral ~  2014  . Esophagogastroduodenoscopy N/A 03/11/2015    Procedure: ESOPHAGOGASTRODUODENOSCOPY (EGD);  Surgeon: Vida Rigger, MD;  Location: [facility] ENDOSCOPY;  Service: Endoscopy;  Laterality: N/A;   Family History  Problem Relation Age of Onset  . Diabetes Mother 22    deceased  . Diabetes Sister   . Heart attack Father 71    deceased  . Heart disease Sister 33   Social History  Substance Use Topics  . Smoking status: Current Every Day Smoker -- 1.00 packs/day for 46 years    Types: Cigarettes  . Smokeless tobacco: Never Used  . Alcohol Use: 6.0 oz/week    10 Shots of liquor per week     Comment: 03/11/2015 pt states he quit 10/2012; son states he's "drinking 1/2 gallon of alcohol, vodka, per 3 weeks"    Review of Systems  Constitutional: Negative for fever, chills, diaphoresis, appetite change and fatigue.  HENT: Negative for mouth sores, sore throat and trouble swallowing.   Eyes: Negative for visual disturbance.  Respiratory: Negative for cough, chest tightness, shortness of breath and wheezing.   Cardiovascular: Negative for chest pain.  Gastrointestinal: Positive for nausea, vomiting and abdominal pain. Negative for diarrhea and abdominal distention.  Endocrine: Negative for polydipsia, polyphagia and polyuria.  Genitourinary: Negative for dysuria, frequency and hematuria.  Musculoskeletal: Negative for gait problem.  Skin: Negative for color change, pallor and rash.  Neurological: Negative for dizziness, syncope, light-headedness and headaches.  Hematological: Does not bruise/bleed easily.  Psychiatric/Behavioral: Negative for behavioral problems and confusion.      Allergies  Review of patient's allergies indicates no known allergies.  Home Medications   Prior to Admission medications   Medication Sig Start Date End Date Taking? Authorizing Provider  ferrous sulfate 325 (65 FE) MG tablet Take 1 tablet (325 mg total) by mouth daily with breakfast. 03/12/15  Yes Tasrif Ahmed, MD   lisinopril (PRINIVIL,ZESTRIL) 10 MG tablet Take 1 tablet (10 mg total) by mouth daily. 03/12/15 03/11/16 Yes Tasrif Ahmed, MD  pantoprazole (PROTONIX) 40 MG tablet Take 1 tablet (40 mg total) by mouth daily. 03/12/15  Yes Tasrif Ahmed, MD  feeding supplement, RESOURCE BREEZE, (RESOURCE BREEZE) LIQD Take 1 Container by mouth 3 (three) times daily between meals. Patient not taking: Reported on 04/16/2015 03/12/15   Hyacinth Meeker, MD  folic acid (FOLVITE) 1 MG tablet Take 1 tablet (1 mg total) by mouth daily. Patient not taking: Reported on 01/17/2016 03/12/15   Hyacinth Meeker, MD  omeprazole (PRILOSEC) 20 MG capsule Take 1 capsule (20 mg total) by mouth 2 (two) times daily. 01/17/16   Rolland Porter, MD  omeprazole (PRILOSEC) 40 MG capsule Take 1 capsule (40 mg total) by mouth daily. 03/06/13 03/18/16  Alejandro Paya, DO   BP 169/91 mmHg  Pulse 92  Resp 16  SpO2 99% Physical Exam  Constitutional: He is oriented to person, place, and time. He appears well-developed and well-nourished. No distress.  HENT:  Head: Normocephalic.  Eyes: Conjunctivae are normal. Pupils are equal, round, and reactive to light. No scleral icterus.  Neck: Normal range of motion. Neck supple. No thyromegaly present.  Conjunctiva not pale.  Cardiovascular: Normal rate and regular rhythm.  Exam reveals no gallop and no friction rub.   No murmur heard. Pulmonary/Chest: Effort normal and breath sounds normal. No respiratory distress. He has no wheezes. He has no rales.  Abdominal: Soft. Bowel sounds are normal. He exhibits no distension. There is no tenderness. There is no rebound.    Rectal soft brown stool. Guaiac negative.  Musculoskeletal: Normal range of motion.  Neurological: He is alert and oriented to person, place, and time.  Skin: Skin is warm and dry. No rash noted.  Psychiatric: He has a normal mood and affect. His behavior is normal.    ED Course  Procedures (including critical care time) Labs Review Labs Reviewed    CBC WITH DIFFERENTIAL/PLATELET - Abnormal; Notable for the following:    WBC 10.8 (*)    HCT 38.1 (*)    Platelets 111 (*)    Neutro Abs 9.6 (*)    All other components within normal limits  COMPREHENSIVE METABOLIC PANEL - Abnormal; Notable for the following:    Sodium 132 (*)    Chloride 88 (*)    Glucose, Bld 179 (*)    BUN 39 (*)    Creatinine, Ser 1.45 (*)    AST 62 (*)    GFR calc non Af Amer 49 (*)    GFR calc Af Amer 57 (*)    Anion gap 18 (*)    All other components within normal limits  LIPASE, BLOOD - Abnormal; Notable for the following:    Lipase 171 (*)    All other components within normal limits  TROPONIN I - Abnormal; Notable for the following:    Troponin I 0.05 (*)    All other components within normal limits  ETHANOL    Imaging Review No results found. I have personally reviewed and evaluated these images and lab results as part of my medical decision-making.   EKG Interpretation   Date/Time:  Monday January 17 2016 08:45:36 EDT Ventricular Rate:  94 PR Interval:  129 QRS Duration: 85 QT Interval:  362 QTC Calculation: 453 R Axis:   72 Text Interpretation:  Sinus rhythm Probable anteroseptal infarct, Slow R  progression, noted  04/2015 Confirmed by Fayrene FearingJAMES  MD, Tailynn Armetta ([id number]) on  01/17/2016 8:48:51 AM      MDM   Final diagnoses:  Gastritis    Patient remains asymptomatic here. First troponin 0.05. He has no chest pain this is clearly abdominal. No elevation of lipase. No back pain. Benign abdomen. No indications for imaging at this time. Encouraged him to be a non-drinker. Be compliant with his Prilosec. Follow-up with his primary care physician.    Rolland PorterMark Alexanderjames, MD 01/17/16 1247

## 2016-01-17 NOTE — ED Notes (Signed)
xphoidal pain n/n and cp since yesterday has had coffee ground vomit ,  bms normal, has also had tremors and shakes, no asa or nitro given per ems has 20 rt ac

## 2016-02-13 ENCOUNTER — Encounter (HOSPITAL_COMMUNITY): Payer: Self-pay | Admitting: Emergency Medicine

## 2016-02-13 ENCOUNTER — Emergency Department (HOSPITAL_COMMUNITY): Payer: Medicare Other

## 2016-02-13 ENCOUNTER — Emergency Department (HOSPITAL_COMMUNITY)
Admission: EM | Admit: 2016-02-13 | Discharge: 2016-02-13 | Disposition: A | Discharge status: Home / Self Care | Payer: Medicare Other | Attending: Emergency Medicine | Admitting: Emergency Medicine

## 2016-02-13 DIAGNOSIS — Y9289 Other specified places as the place of occurrence of the external cause: Secondary | ICD-10-CM | POA: Diagnosis not present

## 2016-02-13 DIAGNOSIS — S79911A Unspecified injury of right hip, initial encounter: Secondary | ICD-10-CM | POA: Diagnosis present

## 2016-02-13 DIAGNOSIS — R1110 Vomiting, unspecified: Secondary | ICD-10-CM | POA: Insufficient documentation

## 2016-02-13 DIAGNOSIS — Y998 Other external cause status: Secondary | ICD-10-CM | POA: Diagnosis not present

## 2016-02-13 DIAGNOSIS — D509 Iron deficiency anemia, unspecified: Secondary | ICD-10-CM | POA: Diagnosis not present

## 2016-02-13 DIAGNOSIS — F17210 Nicotine dependence, cigarettes, uncomplicated: Secondary | ICD-10-CM | POA: Diagnosis not present

## 2016-02-13 DIAGNOSIS — S7001XA Contusion of right hip, initial encounter: Secondary | ICD-10-CM | POA: Insufficient documentation

## 2016-02-13 DIAGNOSIS — W01198A Fall on same level from slipping, tripping and stumbling with subsequent striking against other object, initial encounter: Secondary | ICD-10-CM | POA: Diagnosis not present

## 2016-02-13 DIAGNOSIS — Z79899 Other long term (current) drug therapy: Secondary | ICD-10-CM | POA: Diagnosis not present

## 2016-02-13 DIAGNOSIS — Z8719 Personal history of other diseases of the digestive system: Secondary | ICD-10-CM | POA: Insufficient documentation

## 2016-02-13 DIAGNOSIS — Y9389 Activity, other specified: Secondary | ICD-10-CM | POA: Insufficient documentation

## 2016-02-13 DIAGNOSIS — I5032 Chronic diastolic (congestive) heart failure: Secondary | ICD-10-CM | POA: Insufficient documentation

## 2016-02-13 MED ORDER — LORAZEPAM 1 MG PO TABS
1.0000 mg | ORAL_TABLET | Freq: Once | ORAL | Status: AC
  Administered 2016-02-13: 1 mg via ORAL
  Filled 2016-02-13: qty 1

## 2016-02-13 NOTE — ED Provider Notes (Signed)
CSN: [id number]     Arrival date & time 02/13/16  1727 History   First MD Initiated Contact with Patient 02/13/16 1739     Chief Complaint  Patient presents with  . Fall  . Hip Pain  . Emesis     (Consider location/radiation/quality/duration/timing/severity/associated sxs/prior Treatment) Patient is a 65 y.o. male presenting with fall, hip pain, and vomiting. The history is provided by the patient. No language interpreter was used.  Fall This is a new problem. The current episode started today. The problem occurs constantly. The problem has been gradually worsening. Associated symptoms include vomiting. Nothing aggravates the symptoms. He has tried nothing for the symptoms. The treatment provided moderate relief.  Hip Pain Associated symptoms include vomiting.  Emesis Pt reports he fell today and hit his right hip.  Pt reports pain with walking.    Past Medical History  Diagnosis Date  . Seizure [facility])     generalized major motor seizures // Started after Head injury 2008 // Followed at [facility] by Dr. Sandria Manly  . 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 Iron deficiency anemia due to chronic blood loss     BL Hgb 12-13  . Antral ulcer 10/2011    Small ulcer noted per Endoscopy (Dr. Madilyn Fireman)  . 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 Madilyn Fireman)  . Esophagitis 10/2011    per EGD (10/2011)  . Alcohol abuse     with history of DTs // states last drink was 10/2012  . Tobacco abuse   . Chronic diastolic heart failure (HCC)     grade 2 per 2D echo (12/2012)  . History of blood product transfusion ~ 2014    LGIB  . Lower GI bleeding 2014   Past Surgical History  Procedure Laterality Date  . Cataract extraction w/ intraocular lens  implant, bilateral  Bilateral ~ 2014  . Esophagogastroduodenoscopy N/A 03/11/2015    Procedure: ESOPHAGOGASTRODUODENOSCOPY (EGD);  Surgeon: Vida Rigger, MD;  Location: [facility] ENDOSCOPY;  Service: Endoscopy;  Laterality: N/A;   Family History  Problem Relation Age of Onset  . Diabetes Mother 70    deceased  . Diabetes Sister   . Heart attack Father 56    deceased  . Heart disease Sister 19   Social History  Substance Use Topics  . Smoking status: Current Every Day Smoker -- 1.00 packs/day for 46 years    Types: Cigarettes  . Smokeless tobacco: Never Used  . Alcohol Use: 6.0 oz/week    10 Shots of liquor per week     Comment: 03/11/2015 pt states he quit 10/2012; son states he's "drinking 1/2 gallon of alcohol, vodka, per 3 weeks"    Review of Systems  Gastrointestinal: Positive for vomiting.  All other systems reviewed and are negative.     Allergies  Review of patient's allergies indicates no known allergies.  Home Medications   Prior to Admission medications   Medication Sig Start Date End Date Taking? Authorizing Provider  feeding supplement, RESOURCE BREEZE, (RESOURCE BREEZE) LIQD Take 1 Container by mouth 3 (three) times daily between meals. Patient not taking: Reported on 04/16/2015 03/12/15   Hyacinth Meeker, MD  ferrous sulfate 325 (65 FE) MG tablet Take 1 tablet (325 mg total) by mouth daily with breakfast. 03/12/15  Hyacinth Meekerasrif Ahmed, MD  folic acid (FOLVITE) 1 MG tablet Take 1 tablet (1 mg total) by mouth daily. Patient not taking: Reported on 01/17/2016 03/12/15   Hyacinth Meekerasrif Ahmed, MD  lisinopril (PRINIVIL,ZESTRIL) 10 MG tablet Take 1 tablet (10 mg total) by mouth daily. 03/12/15 03/11/16  Tasrif Ahmed, MD  omeprazole (PRILOSEC) 20 MG capsule Take 1 capsule (20 mg total) by mouth 2 (two) times daily. 01/17/16   Rolland PorterMark Marissa, MD  omeprazole (PRILOSEC) 40 MG capsule Take 1 capsule (40 mg total) by mouth daily. 03/06/13 03/18/16  Jonah BlueAlejandro Paya, DO  pantoprazole (PROTONIX) 40 MG tablet Take 1 tablet (40 mg total) by  mouth daily. 03/12/15   Tasrif Ahmed, MD   BP 158/111 mmHg  Pulse 101  Temp(Src) 98.2 F (36.8 C)  Resp 18  SpO2 95% Physical Exam  Constitutional: He is oriented to person, place, and time. He appears well-developed and well-nourished.  HENT:  Head: Normocephalic and atraumatic.  Eyes: EOM are normal.  Neck: Normal range of motion.  Cardiovascular: Normal rate.   Pulmonary/Chest: Effort normal.  Abdominal: He exhibits no distension.  Musculoskeletal: He exhibits tenderness.  Tender right hip,  Pain with movement.  Pt able to walk with assistance.  Neurological: He is alert and oriented to person, place, and time.  Skin: Skin is warm.  Psychiatric: He has a normal mood and affect.  Nursing note and vitals reviewed.   ED Course  Procedures (including critical care time) Labs Review Labs Reviewed - No data to display  Imaging Review Dg Hip Unilat  With Pelvis 2-3 Views Right  02/13/2016  CLINICAL DATA:  Right posterior hip pain s/p fall today. Denies previous injury. EXAM: DG HIP (WITH OR WITHOUT PELVIS) 2-3V RIGHT COMPARISON:  None. FINDINGS: There is no evidence of hip fracture or dislocation. There is no evidence of arthropathy or other focal bone abnormality. IMPRESSION: Negative. Electronically Signed   By: Norva PavlovElizabeth  Brown M.D.   On: 02/13/2016 18:01   I have personally reviewed and evaluated these images and lab results as part of my medical decision-making.   EKG Interpretation None        Pt very tremulous when walking.   Pt has history of alcohol use/possible dt's.  Pt given ativan 1 mg.     Final diagnoses:  Contusion of right hip, initial encounter    Tylenol or ibuprofen An After Visit Summary was printed and given to the patient.    Lonia SkinnerLeslie K Lake CatherineSofia, PA-C 02/13/16 1938  Alvira MondayErin Schlossman, MD 02/14/16 [phone]

## 2016-02-13 NOTE — ED Notes (Signed)
Pt from home. Pt reports his R foot gave out this am, causing him to fall. Pt complains of R hip pain after fall. No shortening or rotation. Pt says he has been able to bear some weight after his fall. EMS also reports he had 1 episode of emesis when they arrived. Accompanied by sweating and chills since yesterday. Pt denies etoh use or diarrhea. Only had 1 episode of emesis which was relieved by 4mg  zofran given by EMS.

## 2016-02-13 NOTE — Discharge Instructions (Signed)

## 2016-02-13 NOTE — ED Notes (Signed)
[facility] Expected date: 02/13/16 Expected time: 5:20 PM Means of arrival:  Comments: Non tramatic hip pain

## 2016-03-11 ENCOUNTER — Emergency Department (HOSPITAL_COMMUNITY): Payer: Medicare Other

## 2016-03-11 ENCOUNTER — Encounter (HOSPITAL_COMMUNITY): Payer: Self-pay | Admitting: Emergency Medicine

## 2016-03-11 ENCOUNTER — Emergency Department (HOSPITAL_COMMUNITY)
Admission: EM | Admit: 2016-03-11 | Discharge: 2016-03-11 | Disposition: A | Discharge status: Home / Self Care | Payer: Medicare Other | Attending: Emergency Medicine | Admitting: Emergency Medicine

## 2016-03-11 DIAGNOSIS — I5032 Chronic diastolic (congestive) heart failure: Secondary | ICD-10-CM | POA: Diagnosis not present

## 2016-03-11 DIAGNOSIS — R0789 Other chest pain: Secondary | ICD-10-CM | POA: Diagnosis not present

## 2016-03-11 DIAGNOSIS — Z79899 Other long term (current) drug therapy: Secondary | ICD-10-CM | POA: Insufficient documentation

## 2016-03-11 DIAGNOSIS — F1010 Alcohol abuse, uncomplicated: Secondary | ICD-10-CM | POA: Insufficient documentation

## 2016-03-11 DIAGNOSIS — F17210 Nicotine dependence, cigarettes, uncomplicated: Secondary | ICD-10-CM | POA: Diagnosis not present

## 2016-03-11 LAB — BASIC METABOLIC PANEL
ANION GAP: 14 (ref 5–15)
BUN: 25 mg/dL — AB (ref 6–20)
CHLORIDE: 105 mmol/L (ref 101–111)
CO2: 23 mmol/L (ref 22–32)
Calcium: 9.2 mg/dL (ref 8.9–10.3)
Creatinine, Ser: 1.25 mg/dL — ABNORMAL HIGH (ref 0.61–1.24)
GFR calc Af Amer: >60 mL/min (ref >60)
GFR calc non Af Amer: 59 mL/min — ABNORMAL LOW (ref >60)
GLUCOSE: 95 mg/dL (ref 65–99)
POTASSIUM: 4.1 mmol/L (ref 3.5–5.1)
Sodium: 142 mmol/L (ref 135–145)

## 2016-03-11 LAB — CBC
HEMATOCRIT: 40.6 % (ref 39.0–52.0)
HEMOGLOBIN: 13.9 g/dL (ref 13.0–17.0)
MCH: 29.3 pg (ref 26.0–34.0)
MCHC: 34.2 g/dL (ref 30.0–36.0)
MCV: 85.5 fL (ref 78.0–100.0)
Platelets: 216 10*3/uL (ref 150–400)
RBC: 4.75 MIL/uL (ref 4.22–5.81)
RDW: 14.3 % (ref 11.5–15.5)
WBC: 7.3 10*3/uL (ref 4.0–10.5)

## 2016-03-11 LAB — ETHANOL: ALCOHOL ETHYL (B): 171 mg/dL — AB (ref <5)

## 2016-03-11 LAB — I-STAT TROPONIN, ED: Troponin i, poc: 0.01 ng/mL (ref 0.00–0.08)

## 2016-03-11 MED ORDER — SODIUM CHLORIDE 0.9 % IV BOLUS (SEPSIS)
1000.0000 mL | Freq: Once | INTRAVENOUS | Status: AC
  Administered 2016-03-11: 1000 mL via INTRAVENOUS

## 2016-03-11 MED ORDER — LORAZEPAM 2 MG/ML IJ SOLN
0.5000 mg | Freq: Once | INTRAMUSCULAR | Status: AC
  Administered 2016-03-11: 0.5 mg via INTRAVENOUS
  Filled 2016-03-11: qty 1

## 2016-03-11 NOTE — Discharge Instructions (Signed)

## 2016-03-11 NOTE — ED Notes (Signed)
Bed: [facility] Expected date:  Expected time:  Means of arrival:  Comments: EMS-forgot, geriatric pt

## 2016-03-11 NOTE — ED Notes (Addendum)
Per EMS[facility] very diaphoretic and SOB, positive orthostatic hypotension. Initial HR on arrival was 160. EMS started an IV and gave a bolus of 500 NS. Patient has tremors on arrival.   C/o central chest pain that started today. Appears anxious, states his last drink was yesterday.

## 2016-03-11 NOTE — ED Provider Notes (Signed)
CSN: [id number]     Arrival date & time 03/11/16  1823 History   First MD Initiated Contact with Patient 03/11/16 1911     Chief Complaint  Patient presents with  . Chest Pain  . Tremors  PT BROUGHT IN BY EMS B/C HE C/O CP AND TREMORS.  EMS SAID THAT HIS INITIAL HR WAS 160.  EMS GAVE PT 500 CC OF NS.  THE PT IS VERY TREMULOUS ON EXAM.  HE DENIES ETOH ABUSE, BUT SMELLS OF ALCOHOL AND THIS EVENT OCCURRED AT THE [facility].   (Consider location/radiation/quality/duration/timing/severity/associated sxs/prior Treatment) The history is provided by the patient.    Past Medical History  Diagnosis Date  . Seizure [facility](HCC)     generalized major motor seizures // Started after Head injury 2008 // [facility] Dr. Sandria ManlyLove  . Intracerebral bleed due to trauma [facility](HCC) 12/2006    History of fall in 2008 with resultant left frontal anteriocerebral hemorrhage and bifrontal subdural hematomas and SAH // Started on seizure prophylaxis at this time  . Iron deficiency anemia due to chronic blood loss     BL Hgb 12-13  . Antral ulcer 10/2011    Small ulcer noted per Endoscopy (Dr. Madilyn FiremanHayes)  . 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 Madilyn FiremanHayes)  . Esophagitis 10/2011    per EGD (10/2011)  . Alcohol abuse     with history of DTs // states last drink was 10/2012  . Tobacco abuse   . Chronic diastolic heart failure (HCC)     grade 2 per 2D echo (12/2012)  . History of blood product transfusion ~ 2014    LGIB  . Lower GI bleeding 2014   Past Surgical History  Procedure Laterality Date  . Cataract extraction w/ intraocular lens  implant, bilateral Bilateral ~ 2014  . Esophagogastroduodenoscopy N/A 03/11/2015    Procedure: ESOPHAGOGASTRODUODENOSCOPY (EGD);  Surgeon: Vida RiggerMarc Magod, MD;  [facility];  Service: Endoscopy;  Laterality: N/A;   Family History  Problem Relation Age of Onset  . Diabetes Mother 6460    deceased  . Diabetes  Sister   . Heart attack Father 7261    deceased  . Heart disease Sister 4136   Social History  Substance Use Topics  . Smoking status: Current Every Day Smoker -- 1.00 packs/day for 46 years    Types: Cigarettes  . Smokeless tobacco: Never Used  . Alcohol Use: 6.0 oz/week    10 Shots of liquor per week     Comment: 03/11/2015 pt states he quit 10/2012; son states he's "drinking 1/2 gallon of alcohol, vodka, per 3 weeks"    Review of Systems  Cardiovascular: Positive for chest pain.  Neurological: Positive for tremors.  All other systems reviewed and are negative.     Allergies  Review of patient's allergies indicates no known allergies.  Home Medications   Prior to Admission medications   Medication Sig Start Date End Date Taking? Authorizing Provider  omeprazole (PRILOSEC) 20 MG capsule Take 1 capsule (20 mg total) by mouth 2 (two) times daily. 01/17/16  Yes Rolland PorterMark Deep, MD   BP 160/110 mmHg  Pulse 100  Temp(Src) 98.3 F (36.8 C) (Oral)  Resp 22  SpO2 100% Physical Exam  Constitutional: He is oriented to person, place, and time. He appears well-developed and well-nourished.  HENT:  Head: Normocephalic and atraumatic.  Right Ear: External ear normal.  Left Ear: External ear normal.  Nose: Nose normal.  Mouth/Throat: Oropharynx is clear and moist. Mucous membranes are dry.  Eyes: Conjunctivae and EOM are normal. Pupils are equal, round, and reactive to light.  Neck: Normal range of motion. Neck supple.  Cardiovascular: Normal heart sounds and intact distal pulses.  Tachycardia present.   Pulmonary/Chest: Effort normal and breath sounds normal.  Abdominal: Soft. Bowel sounds are normal.  Musculoskeletal: Normal range of motion.  Neurological: He is alert and oriented to person, place, and time. He displays tremor.  Skin: Skin is warm and dry.  Psychiatric: His mood appears anxious. His speech is tangential.  Nursing note and vitals reviewed.   ED Course  Procedures  (including critical care time) Labs Review Labs Reviewed  BASIC METABOLIC PANEL - Abnormal; Notable for the following:    BUN 25 (*)    Creatinine, Ser 1.25 (*)    GFR calc non Af Amer 59 (*)    All other components within normal limits  ETHANOL - Abnormal; Notable for the following:    Alcohol, Ethyl (B) 171 (*)    All other components within normal limits  CBC  URINE RAPID DRUG SCREEN, HOSP PERFORMED  URINALYSIS, ROUTINE W REFLEX MICROSCOPIC (NOT AT [facility])  Rosezena Sensor, ED    Imaging Review Dg Chest 2 View  03/11/2016  CLINICAL DATA:  Chest pain shortness of breath EXAM: CHEST  2 VIEW COMPARISON:  09/26/2011 FINDINGS: The heart size and mediastinal contours are within normal limits. Both lungs are clear. The visualized skeletal structures are unremarkable. IMPRESSION: No active cardiopulmonary disease. Electronically Signed   By: Esperanza Heir M.D.   On: 03/11/2016 19:23   I have personally reviewed and evaluated these images and lab results as part of my medical decision-making.   EKG Interpretation   Date/Time:  Saturday March 11 2016 18:35:11 EDT Ventricular Rate:  100 PR Interval:  130 QRS Duration: 94 QT Interval:  347 QTC Calculation: 447 R Axis:   108 Text Interpretation:  Sinus tachycardia Atrial premature complexes  Probable lateral infarct, age indeterminate Anteroseptal infarct, old  Confirmed by [facility] MD, Timohty Renbarger ([id number]) on 03/11/2016 7:59:52 PM      MDM  PT PULLED OUT HIS IV AND TOLD THE NURSE THAT HE WAS READY TO GO.  I DON'T THINK PT'S CP IS CARDIAC IN NATURE.  I THINK THIS IS ALCOHOL RELATED.  I DON'T THINK I NEED TO IVC PT AS HE IS AWAKE AND ALERT.  HE KNOWS TO RETURN IF WORSE. Final diagnoses:  Atypical chest pain  ETOH abuse       Jacalyn Lefevre, MD 03/11/16 2226

## 2016-03-11 NOTE — ED Notes (Signed)
Patient pulled IV and had blood all over room. Patient states he pulled it out because he is ready to go. Patient refused to urinate and an in and out cath.

## 2016-04-17 ENCOUNTER — Encounter (HOSPITAL_COMMUNITY): Payer: Self-pay | Admitting: Emergency Medicine

## 2016-04-17 ENCOUNTER — Emergency Department (HOSPITAL_COMMUNITY): Payer: Medicare Other

## 2016-04-17 ENCOUNTER — Emergency Department (HOSPITAL_COMMUNITY)
Admission: EM | Admit: 2016-04-17 | Discharge: 2016-04-17 | Disposition: A | Discharge status: Home / Self Care | Payer: Medicare Other | Attending: Dermatology | Admitting: Dermatology

## 2016-04-17 DIAGNOSIS — Z5321 Procedure and treatment not carried out due to patient leaving prior to being seen by health care provider: Secondary | ICD-10-CM | POA: Diagnosis not present

## 2016-04-17 DIAGNOSIS — F17210 Nicotine dependence, cigarettes, uncomplicated: Secondary | ICD-10-CM | POA: Insufficient documentation

## 2016-04-17 DIAGNOSIS — R0602 Shortness of breath: Secondary | ICD-10-CM | POA: Insufficient documentation

## 2016-04-17 MED ORDER — ALBUTEROL SULFATE (2.5 MG/3ML) 0.083% IN NEBU
5.0000 mg | INHALATION_SOLUTION | Freq: Once | RESPIRATORY_TRACT | Status: AC
  Administered 2016-04-17: 5 mg via RESPIRATORY_TRACT
  Filled 2016-04-17: qty 6

## 2016-04-17 NOTE — ED Notes (Signed)
Patient has a hx of Lung Cancer. Patient called EMS due to him having shortness of breath. Patient is on room air. Patient says the shortness of breath gets worse when he is in the heat. Patient was outside waiting on EMS to arrive.

## 2016-04-17 NOTE — ED Notes (Signed)
Recheck on vitals patient stated he has already been treated and is waiting on a ride home.

## 2016-05-10 ENCOUNTER — Emergency Department (HOSPITAL_COMMUNITY)
Admission: EM | Admit: 2016-05-10 | Discharge: 2016-05-10 | Disposition: A | Discharge status: Home / Self Care | Payer: Medicare Other | Source: Home / Self Care | Attending: Emergency Medicine | Admitting: Emergency Medicine

## 2016-05-10 ENCOUNTER — Emergency Department (HOSPITAL_COMMUNITY): Payer: Medicare Other

## 2016-05-10 ENCOUNTER — Encounter (HOSPITAL_COMMUNITY): Payer: Self-pay | Admitting: Emergency Medicine

## 2016-05-10 DIAGNOSIS — G40909 Epilepsy, unspecified, not intractable, without status epilepticus: Secondary | ICD-10-CM

## 2016-05-10 DIAGNOSIS — I5032 Chronic diastolic (congestive) heart failure: Secondary | ICD-10-CM

## 2016-05-10 DIAGNOSIS — F10231 Alcohol dependence with withdrawal delirium: Secondary | ICD-10-CM | POA: Diagnosis not present

## 2016-05-10 DIAGNOSIS — F17210 Nicotine dependence, cigarettes, uncomplicated: Secondary | ICD-10-CM | POA: Insufficient documentation

## 2016-05-10 DIAGNOSIS — R0789 Other chest pain: Secondary | ICD-10-CM | POA: Insufficient documentation

## 2016-05-10 DIAGNOSIS — R1012 Left upper quadrant pain: Secondary | ICD-10-CM | POA: Diagnosis not present

## 2016-05-10 DIAGNOSIS — I110 Hypertensive heart disease with heart failure: Secondary | ICD-10-CM

## 2016-05-10 DIAGNOSIS — F410 Panic disorder [episodic paroxysmal anxiety] without agoraphobia: Secondary | ICD-10-CM | POA: Insufficient documentation

## 2016-05-10 LAB — COMPREHENSIVE METABOLIC PANEL
ALBUMIN: 4.1 g/dL (ref 3.5–5.0)
ALT: 16 U/L — ABNORMAL LOW (ref 17–63)
ANION GAP: 17 — AB (ref 5–15)
AST: 32 U/L (ref 15–41)
Alkaline Phosphatase: 83 U/L (ref 38–126)
BUN: 16 mg/dL (ref 6–20)
CALCIUM: 9.4 mg/dL (ref 8.9–10.3)
CHLORIDE: 103 mmol/L (ref 101–111)
CO2: 21 mmol/L — AB (ref 22–32)
Creatinine, Ser: 1.32 mg/dL — ABNORMAL HIGH (ref 0.61–1.24)
GFR, EST NON AFRICAN AMERICAN: 55 mL/min — AB (ref >60)
Glucose, Bld: 75 mg/dL (ref 65–99)
Potassium: 4.9 mmol/L (ref 3.5–5.1)
SODIUM: 141 mmol/L (ref 135–145)
Total Bilirubin: 0.7 mg/dL (ref 0.3–1.2)
Total Protein: 7.7 g/dL (ref 6.5–8.1)

## 2016-05-10 LAB — CBC
HCT: 42.9 % (ref 39.0–52.0)
HEMOGLOBIN: 14.2 g/dL (ref 13.0–17.0)
MCH: 29 pg (ref 26.0–34.0)
MCHC: 33.1 g/dL (ref 30.0–36.0)
MCV: 87.6 fL (ref 78.0–100.0)
PLATELETS: 281 10*3/uL (ref 150–400)
RBC: 4.9 MIL/uL (ref 4.22–5.81)
RDW: 14.8 % (ref 11.5–15.5)
WBC: 8 10*3/uL (ref 4.0–10.5)

## 2016-05-10 LAB — LIPASE, BLOOD: Lipase: 67 U/L — ABNORMAL HIGH (ref 11–51)

## 2016-05-10 LAB — I-STAT TROPONIN, ED
TROPONIN I, POC: 0 ng/mL (ref 0.00–0.08)
Troponin i, poc: 0 ng/mL (ref 0.00–0.08)

## 2016-05-10 MED ORDER — GI COCKTAIL ~~LOC~~
30.0000 mL | Freq: Once | ORAL | Status: AC
  Administered 2016-05-10: 30 mL via ORAL
  Filled 2016-05-10: qty 30

## 2016-05-10 MED ORDER — LORAZEPAM 2 MG/ML IJ SOLN
1.0000 mg | Freq: Once | INTRAMUSCULAR | Status: AC
  Administered 2016-05-10: 1 mg via INTRAVENOUS
  Filled 2016-05-10: qty 1

## 2016-05-10 MED ORDER — SODIUM CHLORIDE 0.9 % IV BOLUS (SEPSIS)
1000.0000 mL | Freq: Once | INTRAVENOUS | Status: AC
  Administered 2016-05-10: 1000 mL via INTRAVENOUS

## 2016-05-10 NOTE — ED Provider Notes (Signed)
[facility]-EMERGENCY DEPT Provider Note   CSN: [id number] Arrival date & time: 05/10/16  1012  First Provider Contact:  None       History   Chief Complaint Chief Complaint  Patient presents with  . Flank Pain    HPI Troy PascalJames Torres is a 65 y.o. male.  HPI   65 year old male with history of alcohol abuse, seizure, esophagitis, lower GI bleed brought here via EMS from home for evaluation of abdominal pain. Patient report this morning while sitting on his couch and watching the news over [place] and [place] patient developed acute onset of epigastric and left-sided chest pain. He described his pain as "something is coming out of my chest" and patient became anxious, report feeling lightheadedness, and reported breaking out into a sweat. Symptom has been persistent for the past 3 hours, nothing seems to make it better or worse. He admits that he was drinking 3 ounces of vodka this morning as well. He denies pain waking him up from sleep. He denies any fever, headache, neck pain, shortness of breath, productive cough, abdominal pain, back pain, arm numbness, nausea vomiting or diarrhea. However patient did states he did vomit once earlier of nonbloody nonbilious content. Report having similar chest discomfort 2 months ago while he was sitting on his couch. Patient cannot tell me any more details in that. Patient denies any recreational drug use currently. No prior history of PE or DVT, no recent surgery, prolonged bed rest, unilateral leg swelling or calf pain and no active cancer.  Past Medical History:  Diagnosis Date  . Alcohol abuse    with history of DTs // states last drink was 10/2012  . Angiodysplasia of cecum 10/2011   3 medium sized angiodysplastic lesions per colonoscopy (Dr. Madilyn FiremanHayes)  . Antral ulcer 10/2011   Small ulcer noted per Endoscopy (Dr. Madilyn FiremanHayes)  . Chronic diastolic heart failure (HCC)    grade 2 per 2D echo (12/2012)  . Esophagitis 10/2011   per EGD (10/2011)  .  History of blood product transfusion ~ 2014   LGIB  . 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 Iron deficiency anemia due to chronic blood loss    BL Hgb 12-13  . Lower GI bleeding 2014  . Seizure [facility](HCC)    generalized major motor seizures // Started after Head injury 2008 // [facility]. Sandria ManlyLove  . Tobacco abuse     Patient Active Problem List   Diagnosis Date Noted  . Upper GI bleed 03/11/2015  . Acute GI bleeding 03/11/2015  . Acute blood loss anemia 03/11/2015  . Essential hypertension 03/11/2015  . Hypertension 01/29/2013  . Chronic diastolic heart failure (HCC)   . Pre-syncope 01/07/2013  . Iron deficiency anemia due to chronic blood loss   . Gastritis without bleeding 12/19/2011  . Gastric ulcer 12/19/2011  . Tobacco use disorder 10/26/2011  . History of seizure disorder 09/26/2011    Past Surgical History:  Procedure Laterality Date  . CATARACT EXTRACTION W/ INTRAOCULAR LENS  IMPLANT, BILATERAL Bilateral ~ 2014  . ESOPHAGOGASTRODUODENOSCOPY N/A 03/11/2015   Procedure: ESOPHAGOGASTRODUODENOSCOPY (EGD);  Surgeon: Vida RiggerMarc Magod, MD;  [facility];  Service: Endoscopy;  Laterality: N/A;       Home Medications    Prior to Admission medications   Medication Sig Start Date  End Date Taking? Authorizing Provider  omeprazole (PRILOSEC) 20 MG capsule Take 1 capsule (20 mg total) by mouth 2 (two) times daily. 01/17/16   Rolland Porter, MD    Family History Family History  Problem Relation Age of Onset  . Diabetes Mother 84    deceased  . Diabetes Sister   . Heart attack Father 71    deceased  . Heart disease Sister 90    Social History Social History  Substance Use Topics  . Smoking status: Current Every Day Smoker    Packs/day: 1.00    Years: 46.00    Types:  Cigarettes  . Smokeless tobacco: Never Used  . Alcohol use 6.0 oz/week    10 Shots of liquor per week     Comment: 03/11/2015 pt states he quit 10/2012; son states he's "drinking 1/2 gallon of alcohol, vodka, per 3 weeks"     Allergies   Review of patient's allergies indicates no known allergies.   Review of Systems Review of Systems  All other systems reviewed and are negative.    Physical Exam Updated Vital Signs There were no vitals taken for this visit.  Physical Exam  Constitutional: He appears well-developed and well-nourished. No distress (Patient appears anxious, sitting upright.).  HENT:  Head: Atraumatic.  Eyes: Conjunctivae are normal.  Neck: Normal range of motion. Neck supple. No JVD present.  Cardiovascular: Intact distal pulses.   Mild tachycardia without murmurs rubs or gallops  Pulmonary/Chest: Effort normal and breath sounds normal. He exhibits no tenderness.  Abdominal: Soft. Bowel sounds are normal. He exhibits no distension. There is no tenderness (epigastric tenderness, no guarding or rebound tenderness).  Musculoskeletal: He exhibits no edema or tenderness.  Neurological: He is alert.  Skin: No rash noted.  Psychiatric: He has a normal mood and affect.  Nursing note and vitals reviewed.    ED Treatments / Results  Labs (all labs ordered are listed, but only abnormal results are displayed) Labs Reviewed  LIPASE, BLOOD - Abnormal; Notable for the following:       Result Value   Lipase 67 (*)    All other components within normal limits  COMPREHENSIVE METABOLIC PANEL - Abnormal; Notable for the following:    CO2 21 (*)    Creatinine, Ser 1.32 (*)    ALT 16 (*)    GFR calc non Af Amer 55 (*)    Anion gap 17 (*)    All other components within normal limits  CBC  URINALYSIS, ROUTINE W REFLEX MICROSCOPIC (NOT AT [facility])  I-STAT TROPOININ, ED  I-STAT TROPOININ, ED    EKG  EKG Interpretation  Date/Time:  Wednesday May 10 2016 10:28:58  EDT Ventricular Rate:  103 PR Interval:    QRS Duration: 89 QT Interval:  349 QTC Calculation: 457 R Axis:   70 Text Interpretation:  Sinus tachycardia with irregular rate Anteroseptal infarct, old Nonspecific T wave abnormality, improved in Inferior leads Confirmed by KNOTT MD, Reuel Boom ([id number]) on 05/10/2016 10:31:25 AM Also confirmed by KNOTT MD, DANIEL [phone]), editor [place], [place]a [place] [phone])  on 05/10/2016 10:33:05 AM       Radiology Dg Chest 2 View  Result Date: 05/10/2016 CLINICAL DATA:  Chest pain. EXAM: CHEST  2 VIEW COMPARISON:  04/17/2016 FINDINGS: The heart size and mediastinal contours are within normal limits. There is no evidence of pulmonary edema, consolidation, pneumothorax, nodule or pleural fluid. The visualized skeletal structures are unremarkable. IMPRESSION: No active cardiopulmonary disease. Electronically Signed   By: Sherrine Maples  Fredia Sorrow M.D.   On: 05/10/2016 11:38    Procedures Procedures (including critical care time)  Medications Ordered in ED Medications  LORazepam (ATIVAN) injection 1 mg (1 mg Intravenous Given 05/10/16 1058)  gi cocktail (Maalox,Lidocaine,Donnatal) (30 mLs Oral Given 05/10/16 1058)  sodium chloride 0.9 % bolus 1,000 mL (1,000 mLs Intravenous New Bag/Given 05/10/16 1138)     Initial Impression / Assessment and Plan / ED Course  I have reviewed the triage vital signs and the nursing notes.  Pertinent labs & imaging results that were available during my care of the patient were reviewed by me and considered in my medical decision making (see chart for details).  Clinical Course    BP 148/96   Pulse 81   Resp 24   SpO2 100%    Final Clinical Impressions(s) / ED Diagnoses   Final diagnoses:  Other chest pain  Panic attack    New Prescriptions New Prescriptions   No medications on file   10:38 AM Patient here with acute onset of epigastric pain and left-sided chest pain after watching the news. Patient admits that he is concerned that  "Trump is going to do something" in regards to a recent current event. I suspect pain is slightly due to anxiety as patient appears anxious. He does have history of alcohol abuse. He has a well's criteria score of 1.5 due to increase heart rate, but likely low risk of PE and pt denies having SOB.  EKG shows mild ST depression to the inferior leads.  Will initiate work up to r/o ACS. Patient does have epigastric tenderness on palpation, history of alcohol abuse, will check lipase to rule out pancreatitis. Care discussed with Dr. Clydene Pugh.    11:40 AM After receiving GI cocktail and IV Ativan patient felt much better and his symptoms resolved. Plan to continue to monitor patient and obtain a delta troponin. If that is negative, patient likely stable for discharge.  2:16 PM Negative delta trop.  Pt remains asymptomatic.  He felt comfortable going home.  Pt to f/u with PCP for further care.  Return precaution discussed.  Suspect panic attack.  Doubt PE, ACS, dissection, or other acute emergent medical condition at this time.    Fayrene Helper, PA-C 05/10/16 1417    Fayrene Helper, PA-C 05/10/16 1417    Lyndal Pulley, MD 05/10/16 [id number]

## 2016-05-10 NOTE — ED Triage Notes (Signed)
Pt arrives via [facility] from home where pt called out for abdominal pain. Per EMS pt acting erractically, smells of ETOH, VSS.

## 2016-05-10 NOTE — Discharge Instructions (Signed)
Your chest discomfort may be related to a panic attack.  You have been evaluated for signs of a heart attack and your test is normal.  Please follow up with your doctor for further care.  Return if you have any concerns. Avoid alcohol consumption.

## 2016-05-11 ENCOUNTER — Encounter (HOSPITAL_COMMUNITY): Payer: Self-pay | Admitting: Emergency Medicine

## 2016-05-11 ENCOUNTER — Emergency Department (HOSPITAL_COMMUNITY)
Admission: EM | Admit: 2016-05-11 | Discharge: 2016-05-12 | Disposition: A | Discharge status: Home / Self Care | Payer: Medicare Other | Source: Home / Self Care | Attending: Emergency Medicine | Admitting: Emergency Medicine

## 2016-05-11 DIAGNOSIS — F1094 Alcohol use, unspecified with alcohol-induced mood disorder: Secondary | ICD-10-CM

## 2016-05-11 DIAGNOSIS — F1014 Alcohol abuse with alcohol-induced mood disorder: Secondary | ICD-10-CM

## 2016-05-11 DIAGNOSIS — F17210 Nicotine dependence, cigarettes, uncomplicated: Secondary | ICD-10-CM

## 2016-05-11 DIAGNOSIS — Z79899 Other long term (current) drug therapy: Secondary | ICD-10-CM

## 2016-05-11 DIAGNOSIS — F419 Anxiety disorder, unspecified: Secondary | ICD-10-CM | POA: Insufficient documentation

## 2016-05-11 LAB — RAPID URINE DRUG SCREEN, HOSP PERFORMED
AMPHETAMINES: NOT DETECTED
BARBITURATES: NOT DETECTED
BENZODIAZEPINES: NOT DETECTED
Cocaine: NOT DETECTED
Opiates: NOT DETECTED
Tetrahydrocannabinol: NOT DETECTED

## 2016-05-11 LAB — CBC
HEMATOCRIT: 37.9 % — AB (ref 39.0–52.0)
Hemoglobin: 13.1 g/dL (ref 13.0–17.0)
MCH: 29.7 pg (ref 26.0–34.0)
MCHC: 34.6 g/dL (ref 30.0–36.0)
MCV: 85.9 fL (ref 78.0–100.0)
PLATELETS: 228 10*3/uL (ref 150–400)
RBC: 4.41 MIL/uL (ref 4.22–5.81)
RDW: 14.8 % (ref 11.5–15.5)
WBC: 5.9 10*3/uL (ref 4.0–10.5)

## 2016-05-11 LAB — COMPREHENSIVE METABOLIC PANEL
ALK PHOS: 75 U/L (ref 38–126)
ALT: 17 U/L (ref 17–63)
ANION GAP: 12 (ref 5–15)
AST: 33 U/L (ref 15–41)
Albumin: 3.7 g/dL (ref 3.5–5.0)
BUN: 19 mg/dL (ref 6–20)
CALCIUM: 8.7 mg/dL — AB (ref 8.9–10.3)
CO2: 22 mmol/L (ref 22–32)
CREATININE: 1.07 mg/dL (ref 0.61–1.24)
Chloride: 107 mmol/L (ref 101–111)
Glucose, Bld: 91 mg/dL (ref 65–99)
Potassium: 3.9 mmol/L (ref 3.5–5.1)
Sodium: 141 mmol/L (ref 135–145)
Total Bilirubin: 0.4 mg/dL (ref 0.3–1.2)
Total Protein: 7.3 g/dL (ref 6.5–8.1)

## 2016-05-11 LAB — ETHANOL: Alcohol, Ethyl (B): 394 mg/dL (ref <5)

## 2016-05-11 NOTE — ED Provider Notes (Signed)
[facility]-EMERGENCY DEPT Provider Note   CSN: [id number] Arrival date & time: 05/11/16  2121  First Provider Contact:  None       History   Chief Complaint Chief Complaint  Patient presents with  . Anxiety  . Agitation  . Alcohol Intoxication    HPI Troy Torres is a 65 y.o. male.  LEVEL 5 CAVEAT SECONDARY TO INTOXICATION  Patient with hx of ETOH abuse, chronic diastolic heart failure, traumatic ICH, and seizures presents to the ED for evaluation. EMS called by neighbor after patient appeared very anxious, stating he was afraid of [place]. EMS gave 5mg  Haldol en route with improvement in anxiety. Patient denies ETOH during my encounter with him, but did admit to drinking when speaking with triage nurse. He c/o right mid posterior and lateral chest wall pain during my encounter with him. He denies falls. States he has not taken any of his prescribed medications because he "cannot afford them".      Past Medical History:  Diagnosis Date  . Alcohol abuse    with history of DTs // states last drink was 10/2012  . Angiodysplasia of cecum 10/2011   3 medium sized angiodysplastic lesions per colonoscopy (Dr. Madilyn FiremanHayes)  . Antral ulcer 10/2011   Small ulcer noted per Endoscopy (Dr. Madilyn FiremanHayes)  . Chronic diastolic heart failure (HCC)    grade 2 per 2D echo (12/2012)  . Esophagitis 10/2011   per EGD (10/2011)  . History of blood product transfusion ~ 2014   LGIB  . 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 Iron deficiency anemia due to chronic blood loss    BL Hgb 12-13  . Lower GI bleeding 2014  . Seizure [facility](HCC)    generalized major motor seizures // Started after Head injury 2008 // [facility] Dr. Sandria ManlyLove  . Tobacco abuse     Patient Active Problem List   Diagnosis Date Noted  .  Upper GI bleed 03/11/2015  . Acute GI bleeding 03/11/2015  . Acute blood loss anemia 03/11/2015  . Essential hypertension 03/11/2015  . Hypertension 01/29/2013  . Chronic diastolic heart failure (HCC)   . Pre-syncope 01/07/2013  . Iron deficiency anemia due to chronic blood loss   . Gastritis without bleeding 12/19/2011  . Gastric ulcer 12/19/2011  . Tobacco use disorder 10/26/2011  . History of seizure disorder 09/26/2011    Past Surgical History:  Procedure Laterality Date  . CATARACT EXTRACTION W/ INTRAOCULAR LENS  IMPLANT, BILATERAL Bilateral ~ 2014  . ESOPHAGOGASTRODUODENOSCOPY N/A 03/11/2015   Procedure: ESOPHAGOGASTRODUODENOSCOPY (EGD);  Surgeon: Vida RiggerMarc Magod, MD;  [facility];  Service: Endoscopy;  Laterality: N/A;       Home Medications    Prior to Admission medications   Medication Sig Start Date End Date Taking? Authorizing Provider  omeprazole (PRILOSEC) 20 MG capsule Take 1 capsule (20 mg total) by mouth 2 (two) times daily. 01/17/16  Yes Rolland PorterMark Raffi, MD    Family History Family History  Problem Relation Age of Onset  . Diabetes Mother 3960    deceased  . Diabetes Sister   . Heart attack Father 5961    deceased  . Heart disease Sister 3236    Social History Social History  Substance Use Topics  . Smoking status: Current Every Day Smoker    Packs/day:  1.00    Years: 46.00    Types: Cigarettes  . Smokeless tobacco: Never Used  . Alcohol use 6.0 oz/week    10 Shots of liquor per week     Comment: 03/11/2015 pt states he quit 10/2012; son states he's "drinking 1/2 gallon of alcohol, vodka, per 3 weeks"     Allergies   Review of patient's allergies indicates no known allergies.   Review of Systems Review of Systems  Unable to perform ROS: Other  Level 5 caveat secondary to intoxication   Physical Exam Updated Vital Signs BP 122/74 (BP Location: Left Arm)   Pulse 86   Temp 97.6 F (36.4 C) (Oral)   Resp 13   SpO2 96%   Physical Exam    Constitutional: He appears well-developed and well-nourished. No distress.  Nontoxic appearing  HENT:  Head: Normocephalic and atraumatic.  Eyes: Conjunctivae and EOM are normal. No scleral icterus.  Neck: Normal range of motion.  Cardiovascular: Normal rate, regular rhythm and intact distal pulses.   Pulmonary/Chest: Effort normal. No respiratory distress. He has no wheezes. He has no rales.  Respirations even and unlabored  Musculoskeletal: Normal range of motion.  Neurological: He is alert.  Patient speaking in goal oriented sentences. He follows commands. GCS 15. He is moving all extremities and transitioning in the bed without difficulty.  Skin: Skin is warm and dry. No rash noted. He is not diaphoretic. No erythema. No pallor.  Psychiatric: His speech is slurred (mild). He is slowed. He exhibits a depressed mood.  Nursing note and vitals reviewed.    ED Treatments / Results  Labs (all labs ordered are listed, but only abnormal results are displayed) Labs Reviewed  COMPREHENSIVE METABOLIC PANEL  ETHANOL  CBC  URINE RAPID DRUG SCREEN, HOSP PERFORMED    EKG  EKG Interpretation None       Radiology Dg Chest 2 View  Result Date: 05/10/2016 CLINICAL DATA:  Chest pain. EXAM: CHEST  2 VIEW COMPARISON:  04/17/2016 FINDINGS: The heart size and mediastinal contours are within normal limits. There is no evidence of pulmonary edema, consolidation, pneumothorax, nodule or pleural fluid. The visualized skeletal structures are unremarkable. IMPRESSION: No active cardiopulmonary disease. Electronically Signed   By: Irish Lack M.D.   On: 05/10/2016 11:38    Procedures Procedures (including critical care time)  Medications Ordered in ED Medications - No data to display   Initial Impression / Assessment and Plan / ED Course  I have reviewed the triage vital signs and the nursing notes.  Pertinent labs & imaging results that were available during my care of the patient were  reviewed by me and considered in my medical decision making (see chart for details).  Clinical Course    6:19 AM Patient reassessed. He is much more awake and alert. Speech is clear. Patient ambulatory in hallway without difficulty. He appears clinically sober. VSS and remainder of labs is reassuring. Patient deemed stable for discharge. Return precautions given.   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

## 2016-05-11 NOTE — ED Notes (Signed)
[facility] Expected date:  Expected time:  Means of arrival:  Comments: EMS 65 yo male/agitated-intoxicated

## 2016-05-11 NOTE — ED Triage Notes (Signed)
[facility] presents with a 65 yo male from home with agitation, anxiety and ETOH.  Neighbor was on scene when [facility] arrived to say patient was stating he was afraid of [place] and very anxious and had anxiety.  Pt was given 5 mg of Haldol by [facility] and symptoms seemed to resolve.  Pt stated he had been drinking but is in no pain at this time.  Pt is still somewhat confused/intoxicated and doesn't follow commands without repeated reminders.  Hx GERD.

## 2016-05-12 ENCOUNTER — Inpatient Hospital Stay (HOSPITAL_COMMUNITY)
Admission: EM | Admit: 2016-05-12 | Discharge: 2016-05-24 | DRG: 896 | Disposition: A | Discharge status: Skilled Nursing Facility | Payer: Medicare Other | Attending: Internal Medicine | Admitting: Internal Medicine

## 2016-05-12 ENCOUNTER — Encounter (HOSPITAL_COMMUNITY): Payer: Self-pay | Admitting: Emergency Medicine

## 2016-05-12 DIAGNOSIS — F10229 Alcohol dependence with intoxication, unspecified: Secondary | ICD-10-CM | POA: Diagnosis present

## 2016-05-12 DIAGNOSIS — G9340 Encephalopathy, unspecified: Secondary | ICD-10-CM | POA: Diagnosis not present

## 2016-05-12 DIAGNOSIS — Z8711 Personal history of peptic ulcer disease: Secondary | ICD-10-CM

## 2016-05-12 DIAGNOSIS — F10931 Alcohol use, unspecified with withdrawal delirium: Secondary | ICD-10-CM | POA: Diagnosis present

## 2016-05-12 DIAGNOSIS — Z961 Presence of intraocular lens: Secondary | ICD-10-CM | POA: Diagnosis present

## 2016-05-12 DIAGNOSIS — R748 Abnormal levels of other serum enzymes: Secondary | ICD-10-CM | POA: Diagnosis present

## 2016-05-12 DIAGNOSIS — K219 Gastro-esophageal reflux disease without esophagitis: Secondary | ICD-10-CM | POA: Diagnosis present

## 2016-05-12 DIAGNOSIS — N390 Urinary tract infection, site not specified: Secondary | ICD-10-CM | POA: Diagnosis present

## 2016-05-12 DIAGNOSIS — Z833 Family history of diabetes mellitus: Secondary | ICD-10-CM

## 2016-05-12 DIAGNOSIS — D696 Thrombocytopenia, unspecified: Secondary | ICD-10-CM | POA: Diagnosis present

## 2016-05-12 DIAGNOSIS — R1012 Left upper quadrant pain: Secondary | ICD-10-CM | POA: Diagnosis not present

## 2016-05-12 DIAGNOSIS — F17210 Nicotine dependence, cigarettes, uncomplicated: Secondary | ICD-10-CM | POA: Diagnosis present

## 2016-05-12 DIAGNOSIS — E46 Unspecified protein-calorie malnutrition: Secondary | ICD-10-CM | POA: Diagnosis present

## 2016-05-12 DIAGNOSIS — Z9841 Cataract extraction status, right eye: Secondary | ICD-10-CM

## 2016-05-12 DIAGNOSIS — K2970 Gastritis, unspecified, without bleeding: Secondary | ICD-10-CM | POA: Diagnosis present

## 2016-05-12 DIAGNOSIS — F10231 Alcohol dependence with withdrawal delirium: Principal | ICD-10-CM | POA: Diagnosis present

## 2016-05-12 DIAGNOSIS — E860 Dehydration: Secondary | ICD-10-CM | POA: Diagnosis present

## 2016-05-12 DIAGNOSIS — D649 Anemia, unspecified: Secondary | ICD-10-CM | POA: Diagnosis present

## 2016-05-12 DIAGNOSIS — R0602 Shortness of breath: Secondary | ICD-10-CM

## 2016-05-12 DIAGNOSIS — I5032 Chronic diastolic (congestive) heart failure: Secondary | ICD-10-CM | POA: Diagnosis present

## 2016-05-12 DIAGNOSIS — Z8249 Family history of ischemic heart disease and other diseases of the circulatory system: Secondary | ICD-10-CM

## 2016-05-12 DIAGNOSIS — K852 Alcohol induced acute pancreatitis without necrosis or infection: Secondary | ICD-10-CM | POA: Diagnosis present

## 2016-05-12 DIAGNOSIS — A419 Sepsis, unspecified organism: Secondary | ICD-10-CM | POA: Diagnosis not present

## 2016-05-12 DIAGNOSIS — E876 Hypokalemia: Secondary | ICD-10-CM | POA: Diagnosis not present

## 2016-05-12 DIAGNOSIS — E538 Deficiency of other specified B group vitamins: Secondary | ICD-10-CM | POA: Diagnosis present

## 2016-05-12 DIAGNOSIS — I10 Essential (primary) hypertension: Secondary | ICD-10-CM | POA: Diagnosis present

## 2016-05-12 DIAGNOSIS — L03115 Cellulitis of right lower limb: Secondary | ICD-10-CM | POA: Diagnosis present

## 2016-05-12 DIAGNOSIS — Z9842 Cataract extraction status, left eye: Secondary | ICD-10-CM

## 2016-05-12 DIAGNOSIS — R109 Unspecified abdominal pain: Secondary | ICD-10-CM | POA: Diagnosis present

## 2016-05-12 DIAGNOSIS — Y900 Blood alcohol level of less than 20 mg/100 ml: Secondary | ICD-10-CM | POA: Diagnosis present

## 2016-05-12 DIAGNOSIS — F10232 Alcohol dependence with withdrawal with perceptual disturbance: Secondary | ICD-10-CM | POA: Diagnosis not present

## 2016-05-12 DIAGNOSIS — I110 Hypertensive heart disease with heart failure: Secondary | ICD-10-CM | POA: Diagnosis present

## 2016-05-12 LAB — URINALYSIS, ROUTINE W REFLEX MICROSCOPIC
BILIRUBIN URINE: NEGATIVE
GLUCOSE, UA: NEGATIVE mg/dL
KETONES UR: NEGATIVE mg/dL
NITRITE: POSITIVE — AB
PH: 6 (ref 5.0–8.0)
PROTEIN: 100 mg/dL — AB
Specific Gravity, Urine: 1.013 (ref 1.005–1.030)

## 2016-05-12 LAB — URINE MICROSCOPIC-ADD ON

## 2016-05-12 LAB — HEPATIC FUNCTION PANEL
ALT: 16 U/L — ABNORMAL LOW (ref 17–63)
AST: 34 U/L (ref 15–41)
Albumin: 3.6 g/dL (ref 3.5–5.0)
Alkaline Phosphatase: 67 U/L (ref 38–126)
BILIRUBIN DIRECT: 0.2 mg/dL (ref 0.1–0.5)
BILIRUBIN TOTAL: 1 mg/dL (ref 0.3–1.2)
Indirect Bilirubin: 0.8 mg/dL (ref 0.3–0.9)
Total Protein: 6.8 g/dL (ref 6.5–8.1)

## 2016-05-12 LAB — TSH: TSH: 1.769 u[IU]/mL (ref 0.350–4.500)

## 2016-05-12 LAB — COMPREHENSIVE METABOLIC PANEL
ALBUMIN: 4.2 g/dL (ref 3.5–5.0)
ALK PHOS: 77 U/L (ref 38–126)
ALT: 17 U/L (ref 17–63)
ANION GAP: 13 (ref 5–15)
AST: 40 U/L (ref 15–41)
BUN: 21 mg/dL — ABNORMAL HIGH (ref 6–20)
CALCIUM: 9 mg/dL (ref 8.9–10.3)
CO2: 21 mmol/L — AB (ref 22–32)
Chloride: 103 mmol/L (ref 101–111)
Creatinine, Ser: 1.24 mg/dL (ref 0.61–1.24)
GFR calc non Af Amer: 60 mL/min — ABNORMAL LOW (ref >60)
Glucose, Bld: 105 mg/dL — ABNORMAL HIGH (ref 65–99)
POTASSIUM: 4.6 mmol/L (ref 3.5–5.1)
SODIUM: 137 mmol/L (ref 135–145)
TOTAL PROTEIN: 7.9 g/dL (ref 6.5–8.1)
Total Bilirubin: 1 mg/dL (ref 0.3–1.2)

## 2016-05-12 LAB — CBC
HEMATOCRIT: 37.2 % — AB (ref 39.0–52.0)
HEMOGLOBIN: 12.8 g/dL — AB (ref 13.0–17.0)
MCH: 29.6 pg (ref 26.0–34.0)
MCHC: 34.4 g/dL (ref 30.0–36.0)
MCV: 86.1 fL (ref 78.0–100.0)
Platelets: 229 10*3/uL (ref 150–400)
RBC: 4.32 MIL/uL (ref 4.22–5.81)
RDW: 14.9 % (ref 11.5–15.5)
WBC: 9.3 10*3/uL (ref 4.0–10.5)

## 2016-05-12 LAB — ETHANOL

## 2016-05-12 LAB — LIPASE, BLOOD: Lipase: 72 U/L — ABNORMAL HIGH (ref 11–51)

## 2016-05-12 LAB — AMMONIA: Ammonia: 15 umol/L (ref 9–35)

## 2016-05-12 LAB — VITAMIN B12: Vitamin B-12: 175 pg/mL — ABNORMAL LOW (ref 180–914)

## 2016-05-12 LAB — MAGNESIUM: MAGNESIUM: 1.2 mg/dL — AB (ref 1.7–2.4)

## 2016-05-12 LAB — PHOSPHORUS: PHOSPHORUS: 2.7 mg/dL (ref 2.5–4.6)

## 2016-05-12 MED ORDER — LORAZEPAM 2 MG/ML IJ SOLN
1.0000 mg | Freq: Four times a day (QID) | INTRAMUSCULAR | Status: DC | PRN
  Administered 2016-05-13 – 2016-05-14 (×3): 1 mg via INTRAVENOUS
  Filled 2016-05-12 (×4): qty 1

## 2016-05-12 MED ORDER — DEXTROSE 5 % IV SOLN
1.0000 g | Freq: Once | INTRAVENOUS | Status: AC
  Administered 2016-05-12: 1 g via INTRAVENOUS
  Filled 2016-05-12: qty 10

## 2016-05-12 MED ORDER — THIAMINE HCL 100 MG/ML IJ SOLN
100.0000 mg | Freq: Every day | INTRAMUSCULAR | Status: DC
  Administered 2016-05-12: 100 mg via INTRAVENOUS
  Filled 2016-05-12: qty 2

## 2016-05-12 MED ORDER — ADULT MULTIVITAMIN W/MINERALS CH
1.0000 | ORAL_TABLET | Freq: Every day | ORAL | Status: DC
  Administered 2016-05-13 – 2016-05-18 (×4): 1 via ORAL
  Filled 2016-05-12 (×4): qty 1

## 2016-05-12 MED ORDER — ACETAMINOPHEN 650 MG RE SUPP
650.0000 mg | Freq: Four times a day (QID) | RECTAL | Status: DC | PRN
  Administered 2016-05-19: 650 mg via RECTAL
  Filled 2016-05-12: qty 1

## 2016-05-12 MED ORDER — HYDRALAZINE HCL 20 MG/ML IJ SOLN
10.0000 mg | Freq: Three times a day (TID) | INTRAMUSCULAR | Status: DC | PRN
  Administered 2016-05-16 – 2016-05-18 (×3): 10 mg via INTRAVENOUS
  Filled 2016-05-12 (×3): qty 1

## 2016-05-12 MED ORDER — LORAZEPAM 2 MG/ML IJ SOLN
0.0000 mg | Freq: Two times a day (BID) | INTRAMUSCULAR | Status: DC
  Administered 2016-05-14: 2 mg via INTRAVENOUS
  Filled 2016-05-12: qty 1

## 2016-05-12 MED ORDER — ACETAMINOPHEN 325 MG PO TABS
650.0000 mg | ORAL_TABLET | Freq: Four times a day (QID) | ORAL | Status: DC | PRN
  Administered 2016-05-19 – 2016-05-22 (×3): 650 mg via ORAL
  Filled 2016-05-12 (×3): qty 2

## 2016-05-12 MED ORDER — HEPARIN SODIUM (PORCINE) 5000 UNIT/ML IJ SOLN
5000.0000 [IU] | Freq: Three times a day (TID) | INTRAMUSCULAR | Status: DC
  Administered 2016-05-12 – 2016-05-24 (×35): 5000 [IU] via SUBCUTANEOUS
  Filled 2016-05-12 (×35): qty 1

## 2016-05-12 MED ORDER — LORAZEPAM 2 MG/ML IJ SOLN
2.0000 mg | Freq: Once | INTRAMUSCULAR | Status: AC
  Administered 2016-05-12: 2 mg via INTRAVENOUS

## 2016-05-12 MED ORDER — LORAZEPAM 2 MG/ML IJ SOLN
1.0000 mg | Freq: Once | INTRAMUSCULAR | Status: DC
Start: 2016-05-12 — End: 2016-05-12
  Filled 2016-05-12: qty 1

## 2016-05-12 MED ORDER — LORAZEPAM 1 MG PO TABS
1.0000 mg | ORAL_TABLET | Freq: Once | ORAL | Status: DC
  Filled 2016-05-12: qty 1

## 2016-05-12 MED ORDER — VITAMIN B-1 100 MG PO TABS
100.0000 mg | ORAL_TABLET | Freq: Every day | ORAL | Status: DC
  Administered 2016-05-13 – 2016-05-14 (×2): 100 mg via ORAL
  Filled 2016-05-12 (×2): qty 1

## 2016-05-12 MED ORDER — LORAZEPAM 2 MG/ML IJ SOLN
0.0000 mg | Freq: Four times a day (QID) | INTRAMUSCULAR | Status: AC
  Administered 2016-05-12 – 2016-05-14 (×5): 2 mg via INTRAVENOUS
  Filled 2016-05-12 (×4): qty 1

## 2016-05-12 MED ORDER — FOSFOMYCIN TROMETHAMINE 3 G PO PACK: 3.0000 g | PACK | Freq: Once | ORAL | Status: DC

## 2016-05-12 MED ORDER — SODIUM CHLORIDE 0.9 % IV BOLUS (SEPSIS)
1000.0000 mL | Freq: Once | INTRAVENOUS | Status: AC
  Administered 2016-05-12: 1000 mL via INTRAVENOUS

## 2016-05-12 MED ORDER — PANTOPRAZOLE SODIUM 40 MG PO TBEC
40.0000 mg | DELAYED_RELEASE_TABLET | Freq: Two times a day (BID) | ORAL | Status: DC
Start: 2016-05-12 — End: 2016-05-15
  Administered 2016-05-12 – 2016-05-14 (×4): 40 mg via ORAL
  Filled 2016-05-12 (×4): qty 1

## 2016-05-12 MED ORDER — DEXTROSE 5 % IV SOLN
1.0000 g | INTRAVENOUS | Status: DC
  Administered 2016-05-13 – 2016-05-14 (×2): 1 g via INTRAVENOUS
  Filled 2016-05-12 (×2): qty 10

## 2016-05-12 MED ORDER — LORAZEPAM 1 MG PO TABS
1.0000 mg | ORAL_TABLET | Freq: Four times a day (QID) | ORAL | Status: DC | PRN
  Filled 2016-05-12: qty 1

## 2016-05-12 MED ORDER — FOLIC ACID 1 MG PO TABS
1.0000 mg | ORAL_TABLET | Freq: Every day | ORAL | Status: DC
  Administered 2016-05-13 – 2016-05-14 (×2): 1 mg via ORAL
  Filled 2016-05-12 (×2): qty 1

## 2016-05-12 MED ORDER — NICOTINE 14 MG/24HR TD PT24
14.0000 mg | MEDICATED_PATCH | Freq: Every day | TRANSDERMAL | Status: DC
  Administered 2016-05-12 – 2016-05-24 (×13): 14 mg via TRANSDERMAL
  Filled 2016-05-12 (×13): qty 1

## 2016-05-12 MED ORDER — OXYCODONE HCL 5 MG PO TABS
5.0000 mg | ORAL_TABLET | Freq: Four times a day (QID) | ORAL | Status: DC | PRN
Start: 2016-05-12 — End: 2016-05-15
  Administered 2016-05-13 – 2016-05-14 (×2): 5 mg via ORAL
  Filled 2016-05-12 (×2): qty 1

## 2016-05-12 MED ORDER — THIAMINE HCL 100 MG/ML IJ SOLN: 100.0000 mg | Freq: Every day | INTRAMUSCULAR | Status: DC

## 2016-05-12 MED ORDER — THIAMINE HCL 100 MG/ML IJ SOLN
Freq: Once | INTRAVENOUS | Status: AC
  Administered 2016-05-12: 21:00:00 via INTRAVENOUS
  Filled 2016-05-12: qty 1000

## 2016-05-12 NOTE — ED Triage Notes (Signed)
With triage pt complaint of LUQ pain for 5 days. Pt denies n/v/d or GU symptoms. Pt continues to state nothing makes the pain better or worse.

## 2016-05-12 NOTE — ED Triage Notes (Signed)
Per EMS patient was picked up from side of road for LUQ pain that has been on going for couple days.

## 2016-05-12 NOTE — Progress Notes (Addendum)
Medicare pt without a pcp listed He states Dr Love(neurologist) will be retiring in "two months" Cm attempted to call Dr love office but office closes on Friday  At 1200 Pt given a 4 page list of new medicare pcps within his zip code [phone]   Entered in d/c instructions  Please use the list of medicare providers given to you by the ED case manager to find a new Doctor for follow up care        Next Steps: Schedule an appointment as soon as possible for a visit on 05/12/2016

## 2016-05-12 NOTE — ED Notes (Signed)
Pt accidentally pulled out his IV. He states he was trying to get out of bed to stretch. Pt placed back in bed. He has no complaints at this time.

## 2016-05-12 NOTE — H&P (Signed)
History and Physical    Troy Torres ZOX:[id number] DOB: 11-30-1950 DOA: 05/12/2016  Referring Provider: Dr. Clydene Pugh PCP: No PCP Per Patient  Outpatient Specialists:  Was seen by Dr. Sandria Manly (neurology)  Patient coming from: Home  Chief Complaint: abd pain, confusion, tremulous state.  HPI: Troy Torres is a 65 y.o. male with PMH significant for alcohol abuse (with hx of DT's and seizures in the past), GERD and tobacco abuse; who presented to ED complaining of abd pain. Patient reports pain was localized in his LUQ, crampy in nature, 8/10 in intensity and radiates to mid abdomen; no associated symptoms, nothing makes it better or worse. Patient reports last drink was 2 days or so ago due to ongoing pain in his abdomen. He denies fever, chills, nausea, vomiting, CP, SOB, hematochezia and melena.  After physical exam initiated, pain was more present on his suprapubic region and mid abdomen; patient at this moment endorses some increase urine frequency, but denies any burning and hematuria. Patient was tremulous on exam and oriented X 2 only, judgement and insight appears to be impaired.   ED Course: found to have abnormal UA (suggesting UTI), abd pain and also with elevated CIWA score; patient received ativan, thiamine and TRH called to admit patient for further evaluation and treatment of alcohol withdrawal, mild pancreatitis and UTI.  Review of Systems:  All other systems reviewed and apart from HPI, are negative.  Past Medical History:  Diagnosis Date  . Alcohol abuse    with history of DTs // states last drink was 10/2012  . Angiodysplasia of cecum 10/2011   3 medium sized angiodysplastic lesions per colonoscopy (Dr. Madilyn Fireman)  . Antral ulcer 10/2011   Small ulcer noted per Endoscopy (Dr. Madilyn Fireman)  . Chronic diastolic heart failure (HCC)    grade 2 per 2D echo (12/2012)  . Esophagitis 10/2011   per EGD (10/2011)  . History of blood product transfusion ~ 2014   LGIB  . Intracerebral bleed  due to trauma [facility]) 12/2006   History of fall in 2008 with resultant left frontal anteriocerebral hemorrhage and bifrontal subdural hematomas and SAH // Started on seizure prophylaxis at this time  . Iron deficiency anemia due to chronic blood loss    BL Hgb 12-13  . Lower GI bleeding 2014  . Seizure [facility])    generalized major motor seizures // Started after Head injury 2008 // Followed at [facility] by Dr. Sandria Manly  . Tobacco abuse     Past Surgical History:  Procedure Laterality Date  . CATARACT EXTRACTION W/ INTRAOCULAR LENS  IMPLANT, BILATERAL Bilateral ~ 2014  . ESOPHAGOGASTRODUODENOSCOPY N/A 03/11/2015   Procedure: ESOPHAGOGASTRODUODENOSCOPY (EGD);  Surgeon: Vida Rigger, MD;  Location: [facility] ENDOSCOPY;  Service: Endoscopy;  Laterality: N/A;     reports that he has been smoking Cigarettes.  He has a 46.00 pack-year smoking history. He has never used smokeless tobacco. He reports that he drinks about 6.0 oz of alcohol per week . He reports that he does not use drugs.  No Known Allergies  Family History  Problem Relation Age of Onset  . Diabetes Mother 17    deceased  . Diabetes Sister   . Heart attack Father 29    deceased  . Heart disease Sister 63     Prior to Admission medications   Medication Sig Start Date End Date Taking? Authorizing Provider  omeprazole (PRILOSEC) 20 MG capsule Take 1 capsule (20 mg total) by mouth 2 (two) times daily. Patient not taking:  Reported on 05/12/2016 01/17/16   Rolland Porter, MD    Physical Exam: Vitals:   05/12/16 1320 05/12/16 1500 05/12/16 1503 05/12/16 1737  BP: 152/97 170/92 173/93 150/86  Pulse: 102 101 96 94  Resp:   16 19  Temp:   98.1 F (36.7 C)   TempSrc:   Oral   SpO2:  97% 100% 98%  Weight:      Height:       Constitutional: afebrile, oriented X 2 only (person and knew who was the president and city/state; but unable to answer hospital or hospital name, thought we were in winter, year [id number] and didn't know reason for  admission). Able to follow commands and calmer after lorazepam given in ED. Patient with some tremulous activity appreciated.  Eyes: PERRLA, lids and conjunctivae normal, no icterus or nystagmus seen on exam ENMT: Mucous membranes were moist. Posterior pharynx clear of any exudate or lesions. Poor  dentition.  Neck: normal, supple, no masses, no thyromegaly, no JVD or bruits  Respiratory: clear to auscultation bilaterally, no wheezing, no crackles. Normal respiratory effort. No accessory muscle use.  Cardiovascular: S1 & S2 heard, regular rate and rhythm, no murmurs / rubs / gallops. No extremity edema. 2+ pedal pulses. No carotid bruits.  Abdomen: Soft, slightly tender in mid abd and suprapubic area; No distension, no masses palpated. No hepatosplenomegaly. Bowel sounds normal.  Musculoskeletal: no clubbing / cyanosis. No joint deformity upper and lower extremities. Good ROM, no contractures. Normal muscle tone.  Neurologic: CN 2-12 grossly intact. Sensation intact, DTR normal. Strength 5/5 in all 4 limbs.  Psychiatric: judgment and insight impaired. Alert and oriented x 2. Normal mood and affect (after ativan given in Ed; initially reported to be slightly agitated and hyperactive).   Labs on Admission: I have personally reviewed following labs and imaging studies  CBC:  Recent Labs Lab 05/10/16 1035 05/11/16 2226 05/12/16 1230  WBC 8.0 5.9 9.3  HGB 14.2 13.1 12.8*  HCT 42.9 37.9* 37.2*  MCV 87.6 85.9 86.1  PLT 281 228 229   Basic Metabolic Panel:  Recent Labs Lab 05/10/16 1035 05/11/16 2226 05/12/16 1230  NA 141 141 137  K 4.9 3.9 4.6  CL 103 107 103  CO2 21* 22 21*  GLUCOSE 75 91 105*  BUN 16 19 21*  CREATININE 1.32* 1.07 1.24  CALCIUM 9.4 8.7* 9.0   GFR: Estimated Creatinine Clearance: 58.2 mL/min (by C-G formula based on SCr of 1.24 mg/dL).   Liver Function Tests:  Recent Labs Lab 05/10/16 1035 05/11/16 2226 05/12/16 1230  AST 32 33 40  ALT 16* 17 17    ALKPHOS 83 75 77  BILITOT 0.7 0.4 1.0  PROT 7.7 7.3 7.9  ALBUMIN 4.1 3.7 4.2    Recent Labs Lab 05/10/16 1035 05/12/16 1230  LIPASE 67* 72*   Urine analysis:    Component Value Date/Time   COLORURINE YELLOW 05/12/2016 1251   APPEARANCEUR CLOUDY (A) 05/12/2016 1251   LABSPEC 1.013 05/12/2016 1251   PHURINE 6.0 05/12/2016 1251   GLUCOSEU NEGATIVE 05/12/2016 1251   HGBUR TRACE (A) 05/12/2016 1251   BILIRUBINUR NEGATIVE 05/12/2016 1251   KETONESUR NEGATIVE 05/12/2016 1251   PROTEINUR 100 (A) 05/12/2016 1251   UROBILINOGEN 1.0 09/27/2011 0614   NITRITE POSITIVE (A) 05/12/2016 1251   LEUKOCYTESUR MODERATE (A) 05/12/2016 1251   Radiological Exams on Admission: No results found.  EKG:  Sinus rhythm, poor R wave progression, no acute ischemic changes   Assessment/Plan 1-alcohol abuse with  Alcohol withdrawal (HCC): -will admit to telemetry  -patient very tremulous, oriented X 2 only and CIWA score elevated in ED (around 11) -will start CIWA protocol with lorazepam -will provide thiamine and folic acid -will give 1L of Banana Bag and continue supportive care -will monitor on telemetry -cessation counseling provided; but will benefit further counseling once more alert   2-Elevated BP: -without prior hx of HTN -probably associated with withdrawal  -will use PRN hydralazine  3-UTI: appreciated on his UA -no fever, no elevated WBC's -patient reports increase frequency and suprapubic pain, but not frank dysuria -will use rocephin and follow urine cx's   4-Acute encephalopathy: unknown baseline -currently oriented X 2 and with impaired judgement  -most likely from alcohol withdrawal and potential contribution with infection -will treat with rocephin and use CIWA protocol -will follow clinical response and provide supportive care -will also check TSH and B12  5-Abdominal pain: due to mild pancreatitis/gastritis from alcohol use -lipase 72 -will continue PPI -full  liquid diet and IVF's -PRN analgesics and antiemetics ordered    DVT prophylaxis: heparin   Code Status: Full Family Communication: no family at bedside  Disposition Plan: anticipate discharge back home, hopefully no later than Sunday 05/14/16  Consults called:  None  Admission status: observation, telemetry, LOS no more than 2 midnights     Vassie LollMadera, Wilhemenia Camba MD [facility] Pager [phone]- [phone]  If 7PM-7AM, please contact night-coverage [email or web address] Password [email or web address]  05/12/2016, 6:36 PM

## 2016-05-12 NOTE — ED Notes (Signed)
MD at bedside. Hospitalist at bedside. 

## 2016-05-12 NOTE — ED Notes (Signed)
Pt can go to floor at 18:25

## 2016-05-12 NOTE — ED Notes (Signed)
Pt placed on a bed alarm. 

## 2016-05-12 NOTE — ED Provider Notes (Addendum)
[facility]-EMERGENCY DEPT Provider Note   CSN: [id number] Arrival date & time: 05/12/16  1214  First Provider Contact:  First MD Initiated Contact with Patient 05/12/16 1453        History   Chief Complaint Chief Complaint  Patient presents with  . Abdominal Pain    HPI Troy Torres is a 65 y.o. male.  Patient is a poor historian. He states that his last alcohol intake was 2-3 days ago and clearly diminishes the amount that he drinks but states he prefers vodka. He was here last night and had an alcohol level of 400 yesterday evening.   The history is provided by the patient.  Abdominal Pain   This is a new problem. The current episode started 12 to 24 hours ago. The problem occurs constantly. The problem has not changed since onset.The pain is associated with alcohol use. The pain is located in the LUQ and generalized abdominal region. The pain is moderate.    Past Medical History:  Diagnosis Date  . Alcohol abuse    with history of DTs // states last drink was 10/2012  . Angiodysplasia of cecum 10/2011   3 medium sized angiodysplastic lesions per colonoscopy (Dr. Madilyn FiremanHayes)  . Antral ulcer 10/2011   Small ulcer noted per Endoscopy (Dr. Madilyn FiremanHayes)  . Chronic diastolic heart failure (HCC)    grade 2 per 2D echo (12/2012)  . Esophagitis 10/2011   per EGD (10/2011)  . History of blood product transfusion ~ 2014   LGIB  . Intracerebral bleed due to trauma [facility](HCC) 12/2006   History of fall in 2008 with resultant left frontal anteriocerebral hemorrhage and bifrontal subdural hematomas and SAH // Started on seizure prophylaxis at this time  . Iron deficiency anemia due to chronic blood loss    BL Hgb 12-13  . Lower GI bleeding 2014  . Seizure [facility](HCC)    generalized major motor seizures // Started after Head injury 2008 // [facility] by Dr. Sandria ManlyLove  . Tobacco abuse     Patient Active Problem List   Diagnosis Date Noted  . Upper GI bleed 03/11/2015  . Acute GI bleeding  03/11/2015  . Acute blood loss anemia 03/11/2015  . Essential hypertension 03/11/2015  . Hypertension 01/29/2013  . Chronic diastolic heart failure (HCC)   . Pre-syncope 01/07/2013  . Iron deficiency anemia due to chronic blood loss   . Gastritis without bleeding 12/19/2011  . Gastric ulcer 12/19/2011  . Tobacco use disorder 10/26/2011  . History of seizure disorder 09/26/2011    Past Surgical History:  Procedure Laterality Date  . CATARACT EXTRACTION W/ INTRAOCULAR LENS  IMPLANT, BILATERAL Bilateral ~ 2014  . ESOPHAGOGASTRODUODENOSCOPY N/A 03/11/2015   Procedure: ESOPHAGOGASTRODUODENOSCOPY (EGD);  Surgeon: Vida RiggerMarc Magod, MD;  [facility];  Service: Endoscopy;  Laterality: N/A;       Home Medications    Prior to Admission medications   Medication Sig Start Date End Date Taking? Authorizing Provider  omeprazole (PRILOSEC) 20 MG capsule Take 1 capsule (20 mg total) by mouth 2 (two) times daily. Patient not taking: Reported on 05/12/2016 01/17/16   Rolland PorterMark Navarro, MD    Family History Family History  Problem Relation Age of Onset  . Diabetes Mother 5360    deceased  . Diabetes Sister   . Heart attack Father 3561    deceased  . Heart disease Sister 1136    Social History Social History  Substance Use Topics  . Smoking status: Current Every Day  Smoker    Packs/day: 1.00    Years: 46.00    Types: Cigarettes  . Smokeless tobacco: Never Used  . Alcohol use 6.0 oz/week    10 Shots of liquor per week     Comment: 03/11/2015 pt states he quit 10/2012; son states he's "drinking 1/2 gallon of alcohol, vodka, per 3 weeks"     Allergies   Review of patient's allergies indicates no known allergies.   Review of Systems Review of Systems  Gastrointestinal: Positive for abdominal pain.  All other systems reviewed and are negative.    Physical Exam Updated Vital Signs BP 173/93 (BP Location: Left Arm) Comment: Dr. Clydene Pugh aware.  Pulse 96   Temp 98.1 F (36.7 C) (Oral)    Resp 16   Ht 5\' 8"  (1.727 m)   Wt 164 lb (74.4 kg)   SpO2 100%   BMI 24.94 kg/m   Physical Exam  Constitutional: No distress.  Disheveled appearance, appears older than stated age  HENT:  Head: Normocephalic and atraumatic.  Eyes: Conjunctivae are normal.  Neck: Neck supple. No tracheal deviation present.  Cardiovascular: Regular rhythm and normal heart sounds.  Tachycardia present.   Pulmonary/Chest: Effort normal and breath sounds normal. No respiratory distress.  Abdominal: Soft. He exhibits no distension. There is tenderness in the suprapubic area. There is CVA tenderness (right).  Neurological: He is alert. He is disoriented. He displays tremor.  Skin: Skin is warm and dry.  Psychiatric: His mood appears anxious. He is agitated. Cognition and memory are impaired. He expresses inappropriate judgment.     ED Treatments / Results  Labs (all labs ordered are listed, but only abnormal results are displayed) Labs Reviewed  LIPASE, BLOOD - Abnormal; Notable for the following:       Result Value   Lipase 72 (*)    All other components within normal limits  COMPREHENSIVE METABOLIC PANEL - Abnormal; Notable for the following:    CO2 21 (*)    Glucose, Bld 105 (*)    BUN 21 (*)    GFR calc non Af Amer 60 (*)    All other components within normal limits  CBC - Abnormal; Notable for the following:    Hemoglobin 12.8 (*)    HCT 37.2 (*)    All other components within normal limits  URINALYSIS, ROUTINE W REFLEX MICROSCOPIC (NOT AT [facility]) - Abnormal; Notable for the following:    APPearance CLOUDY (*)    Hgb urine dipstick TRACE (*)    Protein, ur 100 (*)    Nitrite POSITIVE (*)    Leukocytes, UA MODERATE (*)    All other components within normal limits  URINE MICROSCOPIC-ADD ON - Abnormal; Notable for the following:    Squamous Epithelial / LPF 0-5 (*)    Bacteria, UA MANY (*)    All other components within normal limits  URINE CULTURE    EKG  EKG Interpretation None         Radiology No results found.  Procedures Procedures (including critical care time)  Medications Ordered in ED Medications  thiamine (B-1) injection 100 mg (not administered)  cefTRIAXone (ROCEPHIN) 1 g in dextrose 5 % 50 mL IVPB (0 g Intravenous Stopped 05/12/16 1549)  sodium chloride 0.9 % bolus 1,000 mL (0 mLs Intravenous Stopped 05/12/16 1643)  LORazepam (ATIVAN) injection 2 mg (2 mg Intravenous Given 05/12/16 1516)     Initial Impression / Assessment and Plan / ED Course  I have reviewed the triage vital  signs and the nursing notes.  Pertinent labs & imaging results that were available during my care of the patient were reviewed by me and considered in my medical decision making (see chart for details).  Clinical Course    65 y.o. male presents with Tremulousness and diffuse abdominal pain. He is unable to provide much of a history. He has been in the emergency department multiple times recently for chronic alcohol abuse and intoxication. Urinalyses have been ordered but never completed during these previous visits. He had been acutely intoxicated previously but today appears more consistent with an acute withdrawal. He was given Ativan to blunt the symptoms of withdrawal, his tremulousness improved and he remained calm. His UA is concerning for heavy pyuria and urinary tract infection. He does have some right flank pain concerning for ascending extension of infection so would not be a candidate for one-time fosfomycin treatment which would have helped with compliance in this known alcoholic. IV Rocephin was given with fluid for presumed dehydration with tachycardia evident.   The patient pulled his IV, he endorsed being in a rehabilitation facility, explained to me that he had been cleaning the table earlier in the day, was walking around the room and internally stimulated concerning for development of delirium tremens. At this time I do not feel safe discharging him due to his  acute delirium which is likely multifactorial given his urinary tract infection. Hospitalist was consulted for admission and will see the patient in the emergency department.   Final Clinical Impressions(s) / ED Diagnoses   Final diagnoses:  Urinary tract infection without hematuria, site unspecified  Delirium tremens [facility])    New Prescriptions New Prescriptions   No medications on file       Lyndal Pulley, MD 05/12/16 1610

## 2016-05-12 NOTE — Progress Notes (Signed)
Pt noted to very shaky, tremors when he was getting himself repositioned in bed

## 2016-05-12 NOTE — ED Notes (Signed)
Patient ambulated in hallway.

## 2016-05-13 DIAGNOSIS — I110 Hypertensive heart disease with heart failure: Secondary | ICD-10-CM | POA: Diagnosis present

## 2016-05-13 DIAGNOSIS — E46 Unspecified protein-calorie malnutrition: Secondary | ICD-10-CM | POA: Diagnosis present

## 2016-05-13 DIAGNOSIS — D649 Anemia, unspecified: Secondary | ICD-10-CM | POA: Diagnosis present

## 2016-05-13 DIAGNOSIS — E538 Deficiency of other specified B group vitamins: Secondary | ICD-10-CM | POA: Diagnosis present

## 2016-05-13 DIAGNOSIS — Z9841 Cataract extraction status, right eye: Secondary | ICD-10-CM | POA: Diagnosis not present

## 2016-05-13 DIAGNOSIS — L03115 Cellulitis of right lower limb: Secondary | ICD-10-CM | POA: Diagnosis present

## 2016-05-13 DIAGNOSIS — K219 Gastro-esophageal reflux disease without esophagitis: Secondary | ICD-10-CM | POA: Diagnosis present

## 2016-05-13 DIAGNOSIS — K2970 Gastritis, unspecified, without bleeding: Secondary | ICD-10-CM | POA: Diagnosis present

## 2016-05-13 DIAGNOSIS — F10231 Alcohol dependence with withdrawal delirium: Secondary | ICD-10-CM | POA: Diagnosis present

## 2016-05-13 DIAGNOSIS — R1012 Left upper quadrant pain: Secondary | ICD-10-CM | POA: Diagnosis present

## 2016-05-13 DIAGNOSIS — F10239 Alcohol dependence with withdrawal, unspecified: Secondary | ICD-10-CM | POA: Diagnosis not present

## 2016-05-13 DIAGNOSIS — F17210 Nicotine dependence, cigarettes, uncomplicated: Secondary | ICD-10-CM | POA: Diagnosis present

## 2016-05-13 DIAGNOSIS — D696 Thrombocytopenia, unspecified: Secondary | ICD-10-CM | POA: Diagnosis present

## 2016-05-13 DIAGNOSIS — F10229 Alcohol dependence with intoxication, unspecified: Secondary | ICD-10-CM | POA: Diagnosis present

## 2016-05-13 DIAGNOSIS — R748 Abnormal levels of other serum enzymes: Secondary | ICD-10-CM | POA: Diagnosis present

## 2016-05-13 DIAGNOSIS — Z9842 Cataract extraction status, left eye: Secondary | ICD-10-CM | POA: Diagnosis not present

## 2016-05-13 DIAGNOSIS — N390 Urinary tract infection, site not specified: Secondary | ICD-10-CM | POA: Diagnosis not present

## 2016-05-13 DIAGNOSIS — E876 Hypokalemia: Secondary | ICD-10-CM | POA: Diagnosis not present

## 2016-05-13 DIAGNOSIS — Z716 Tobacco abuse counseling: Secondary | ICD-10-CM

## 2016-05-13 DIAGNOSIS — Z961 Presence of intraocular lens: Secondary | ICD-10-CM | POA: Diagnosis present

## 2016-05-13 DIAGNOSIS — E860 Dehydration: Secondary | ICD-10-CM | POA: Diagnosis present

## 2016-05-13 DIAGNOSIS — F10232 Alcohol dependence with withdrawal with perceptual disturbance: Secondary | ICD-10-CM | POA: Diagnosis not present

## 2016-05-13 DIAGNOSIS — Z8711 Personal history of peptic ulcer disease: Secondary | ICD-10-CM | POA: Diagnosis not present

## 2016-05-13 DIAGNOSIS — A419 Sepsis, unspecified organism: Secondary | ICD-10-CM | POA: Diagnosis not present

## 2016-05-13 DIAGNOSIS — K860 Alcohol-induced chronic pancreatitis: Secondary | ICD-10-CM | POA: Diagnosis not present

## 2016-05-13 DIAGNOSIS — G9340 Encephalopathy, unspecified: Secondary | ICD-10-CM | POA: Diagnosis not present

## 2016-05-13 DIAGNOSIS — Y900 Blood alcohol level of less than 20 mg/100 ml: Secondary | ICD-10-CM | POA: Diagnosis present

## 2016-05-13 DIAGNOSIS — K852 Alcohol induced acute pancreatitis without necrosis or infection: Secondary | ICD-10-CM | POA: Diagnosis present

## 2016-05-13 DIAGNOSIS — I5032 Chronic diastolic (congestive) heart failure: Secondary | ICD-10-CM | POA: Diagnosis present

## 2016-05-13 LAB — BASIC METABOLIC PANEL
Anion gap: 7 (ref 5–15)
BUN: 20 mg/dL (ref 6–20)
CALCIUM: 8.4 mg/dL — AB (ref 8.9–10.3)
CHLORIDE: 104 mmol/L (ref 101–111)
CO2: 25 mmol/L (ref 22–32)
CREATININE: 1.04 mg/dL (ref 0.61–1.24)
GFR calc non Af Amer: >60 mL/min (ref >60)
GLUCOSE: 93 mg/dL (ref 65–99)
Potassium: 3.8 mmol/L (ref 3.5–5.1)
Sodium: 136 mmol/L (ref 135–145)

## 2016-05-13 LAB — HIV ANTIBODY (ROUTINE TESTING W REFLEX): HIV Screen 4th Generation wRfx: NONREACTIVE

## 2016-05-13 LAB — LIPASE, BLOOD: Lipase: 60 U/L — ABNORMAL HIGH (ref 11–51)

## 2016-05-13 MED ORDER — MAGNESIUM SULFATE 4 GM/100ML IV SOLN
4.0000 g | Freq: Once | INTRAVENOUS | Status: AC
  Administered 2016-05-13: 4 g via INTRAVENOUS
  Filled 2016-05-13: qty 100

## 2016-05-13 MED ORDER — VITAMIN B-12 1000 MCG PO TABS
1000.0000 ug | ORAL_TABLET | Freq: Every day | ORAL | Status: DC
  Administered 2016-05-13 – 2016-05-24 (×9): 1000 ug via ORAL
  Filled 2016-05-13 (×11): qty 1

## 2016-05-13 NOTE — Progress Notes (Signed)
PROGRESS NOTE                                                                                                                                                                                                             Patient Demographics:    Troy Torres, is a 65 y.o. male, DOB - December 23, 1950, ZOX:[id number]  Admit date - 05/12/2016   Admitting Physician No admitting provider for patient encounter.  Outpatient Primary MD for the patient is No PCP Per Patient  LOS - 0  Outpatient Specialists:None  Chief Complaint  Patient presents with  . Abdominal Pain       Brief Narrative   66 year old male with alcohol abuse with history of DTs and seizures in the past, GERD, ongoing tobacco use presented with left upper quadrant crampy abdominal pain radiating to mid abdomen without aggravating or relieving factors and no associated symptoms. His last drink was 2 days ago (drinks 4 ounces of water daily). Denied fever, chills, nausea, vomiting, chest, shortness of breath bile or urinary symptoms. He did complain of suprapubic pain and tenderness on exam. Patient found to be mildly encephalopathic with impaired judgment secondary to alcohol withdrawal (CIWA 11). Also had UTI. Labs showed mildly elevated lipase, low magnesium and low vitamin B12 level. Admitted to hospitalist service for further management.    Subjective:    Patient denies any headaches, further abdominal pain, nausea or vomiting.   Assessment  & Plan :    Principal Problem:   Alcohol withdrawal (HCC) Mild tremors this morning. CIWA of 4. Continue IV hydration, thiamine, folate and multivitamin. Ativan when necessary for withdrawal symptoms. Counseled strongly on physician. Social work consulted to provide resource on outpatient substance rehabilitation programs.   Active Problems:   UTI (lower urinary tract infection) Follow cultures. Empiric Rocephin. Denies  dysuria.  Mild alcoholic pancreatitis No abdominal pain symptoms. Advance diet.    Acute encephalopathy Likely in the setting of alcohol Intoxication/withdrawal delirium. Now resolved.     GERD (gastroesophageal reflux disease) Continue PPI  Hypomagnesemia Replenish  Vitamin B12 deficiency Level 175. Will start supplement.   Tobacco abuse Counseled on cessation. Add nicotine patch.    Code Status : Full code  Family Communication  : None at bedside  Disposition Plan  : Home possibly on 8/13  Barriers For Discharge : Active symptoms  Consults  :  None  Procedures  :None  DVT Prophylaxis  :  Lovenox -  Lab Results  Component Value Date   PLT 229 05/12/2016    Antibiotics  :   Anti-infectives    Start     Dose/Rate Route Frequency Ordered Stop   05/13/16 1400  cefTRIAXone (ROCEPHIN) 1 g in dextrose 5 % 50 mL IVPB     1 g 100 mL/hr over 30 Minutes Intravenous Every 24 hours 05/12/16 1846     05/12/16 1515  cefTRIAXone (ROCEPHIN) 1 g in dextrose 5 % 50 mL IVPB     1 g 100 mL/hr over 30 Minutes Intravenous  Once 05/12/16 1504 05/12/16 1549        Objective:   Vitals:   05/12/16 1737 05/12/16 1843 05/12/16 2131 05/13/16 0436  BP: 150/86 (!) 149/91 (!) 160/94 (!) 154/88  Pulse: 94 83 83 78  Resp: Temp:  98.9 F (37.2 C) 99.3 F (37.4 C) 98 F (36.7 C)  TempSrc:  Oral Oral Oral  SpO2: 98% 100% 100% 97%  Weight:  64.1 kg (141 lb 4.8 oz)    Height:   (1.702 m)      Wt Readings from Last 3 Encounters:  05/12/16 64.1 kg (141 lb 4.8 oz)  03/12/15 66.6 kg (146 lb 12.8 oz)  03/06/13 65.7 kg (144 lb 14.4 oz)     Intake/Output Summary (Last 24 hours) at 05/13/16 1054 Last data filed at 05/13/16 0437  Gross per 24 hour  Intake              240 ml  Output                1 ml  Net              239 ml     Physical Exam  Gen: not in distress HEENT: no pallor, moist mucosa, supple neck Chest: clear b/l, no added sounds CVS: N  S1&S2, no murmurs, rubs or gallop GI: soft, NT, ND, BS+ Musculoskeletal: warm, no edema CNS: Alert and oriented, mildly tremulous    Data Review:    CBC  Recent Labs Lab 05/10/16 1035 05/11/16 2226 05/12/16 1230  WBC 8.0 5.9 9.3  HGB 14.2 13.1 12.8*  HCT 42.9 37.9* 37.2*  PLT 281 228 229  MCV 87.6 85.9 86.1  MCH 29.0 29.7 29.6  MCHC 33.1 34.6 34.4  RDW 14.8 14.8 14.9    Chemistries   Recent Labs Lab 05/10/16 1035 05/11/16 2226 05/12/16 1230 05/12/16 2004 05/13/16 0528  NA 141 141 137  --  136  K 4.9 3.9 4.6  --  3.8  CL 103 107 103  --  104  CO2 21* 22 21*  --  25  GLUCOSE 75 91 105*  --  93  BUN 16 19 21*  --  20  CREATININE 1.32* 1.07 1.24  --  1.04  CALCIUM 9.4 8.7* 9.0  --  8.4*  MG  --   --   --  1.2*  --   AST 32 33 40 34  --   ALT 16* 17 17 16*  --   ALKPHOS 83 75 77 67  --   BILITOT 0.7 0.4 1.0 1.0  --    ------------------------------------------------------------------------------------------------------------------ No results for input(s): CHOL, HDL, LDLCALC, TRIG, CHOLHDL, LDLDIRECT in the last 72 hours.  Lab Results  Component Value Date   HGBA1C 5.4 09/26/2011   ------------------------------------------------------------------------------------------------------------------  Recent Labs  05/12/16 2004  TSH 1.769   ------------------------------------------------------------------------------------------------------------------  Recent Labs  05/12/16 2004  VITAMINB12 175*    Coagulation profile No results for input(s): INR, PROTIME in the last 168 hours.  No results for input(s): DDIMER in the last 72 hours.  Cardiac Enzymes No results for input(s): CKMB, TROPONINI, MYOGLOBIN in the last 168 hours.  Invalid input(s): CK ------------------------------------------------------------------------------------------------------------------ No results found for: BNP  Inpatient Medications  Scheduled Meds: . cefTRIAXone  (ROCEPHIN)  IV  1 g Intravenous Q24H  . folic acid  1 mg Oral Daily  . heparin  5,000 Units Subcutaneous Q8H  . LORazepam  0-4 mg Intravenous Q6H   Followed by  . [START ON 05/14/2016] LORazepam  0-4 mg Intravenous Q12H  . multivitamin with minerals  1 tablet Oral Daily  . nicotine  14 mg Transdermal Daily  . pantoprazole  40 mg Oral BID  . thiamine  100 mg Oral Daily   Or  . thiamine  100 mg Intravenous Daily   Continuous Infusions:  PRN Meds:.acetaminophen **OR** acetaminophen, hydrALAZINE, LORazepam **OR** LORazepam, oxyCODONE  Micro Results No results found for this or any previous visit (from the past 240 hour(s)).  Radiology Reports Dg Chest 2 View  Result Date: 05/10/2016 CLINICAL DATA:  Chest pain. EXAM: CHEST  2 VIEW COMPARISON:  04/17/2016 FINDINGS: The heart size and mediastinal contours are within normal limits. There is no evidence of pulmonary edema, consolidation, pneumothorax, nodule or pleural fluid. The visualized skeletal structures are unremarkable. IMPRESSION: No active cardiopulmonary disease. Electronically Signed   By: Irish LackGlenn  Yamagata M.D.   On: 05/10/2016 11:38   Dg Chest 2 View  Result Date: 04/17/2016 CLINICAL DATA:  Shortness of breath and chest pain for over 1 week. EXAM: CHEST  2 VIEW COMPARISON:  PA and lateral chest 03/11/2016 and 09/26/2011. FINDINGS: The lungs are clear. Heart size is normal. No pneumothorax or pleural effusion. No focal bony abnormality. IMPRESSION: No acute disease. Electronically Signed   By: Drusilla Kannerhomas  Dalessio M.D.   On: 04/17/2016 16:58    Time Spent in minutes  25   Eddie NorthHUNGEL, Weston Fulco M.D on 05/13/2016 at 10:54 AM  Between 7am to 7pm - Pager - [phone]  After 7pm go to [email or web address] - password [email or web address]  [facility] -  Office  [phone]

## 2016-05-14 LAB — BASIC METABOLIC PANEL
ANION GAP: 8 (ref 5–15)
BUN: 16 mg/dL (ref 6–20)
CHLORIDE: 101 mmol/L (ref 101–111)
CO2: 24 mmol/L (ref 22–32)
Calcium: 8.9 mg/dL (ref 8.9–10.3)
Creatinine, Ser: 0.77 mg/dL (ref 0.61–1.24)
GFR calc Af Amer: >60 mL/min (ref >60)
GFR calc non Af Amer: >60 mL/min (ref >60)
Glucose, Bld: 108 mg/dL — ABNORMAL HIGH (ref 65–99)
POTASSIUM: 3.5 mmol/L (ref 3.5–5.1)
SODIUM: 133 mmol/L — AB (ref 135–145)

## 2016-05-14 LAB — URINE CULTURE

## 2016-05-14 LAB — MAGNESIUM: MAGNESIUM: 1.6 mg/dL — AB (ref 1.7–2.4)

## 2016-05-14 LAB — MRSA PCR SCREENING: MRSA by PCR: NEGATIVE

## 2016-05-14 MED ORDER — CHLORDIAZEPOXIDE HCL 5 MG PO CAPS
10.0000 mg | ORAL_CAPSULE | Freq: Three times a day (TID) | ORAL | Status: DC
  Administered 2016-05-14 (×2): 10 mg via ORAL
  Filled 2016-05-14 (×2): qty 2

## 2016-05-14 MED ORDER — LORAZEPAM 2 MG/ML IJ SOLN: 1.0000 mg/h | INTRAVENOUS | Status: DC

## 2016-05-14 MED ORDER — CHLORDIAZEPOXIDE HCL 25 MG PO CAPS
50.0000 mg | ORAL_CAPSULE | Freq: Three times a day (TID) | ORAL | Status: DC
  Administered 2016-05-14: 50 mg via ORAL
  Filled 2016-05-14: qty 2

## 2016-05-14 MED ORDER — DEXMEDETOMIDINE HCL IN NACL 200 MCG/50ML IV SOLN
0.2000 ug/kg/h | INTRAVENOUS | Status: AC
  Administered 2016-05-14 – 2016-05-15 (×2): 0.2 ug/kg/h via INTRAVENOUS
  Filled 2016-05-14 (×4): qty 50

## 2016-05-14 MED ORDER — CHLORDIAZEPOXIDE HCL 25 MG PO CAPS: 25.0000 mg | ORAL_CAPSULE | Freq: Three times a day (TID) | ORAL | Status: DC

## 2016-05-14 MED ORDER — SODIUM CHLORIDE 0.9 % IV SOLN
INTRAVENOUS | Status: AC
  Administered 2016-05-14 (×2): via INTRAVENOUS

## 2016-05-14 MED ORDER — LORAZEPAM 2 MG/ML IJ SOLN
1.0000 mg | Freq: Once | INTRAMUSCULAR | Status: AC
  Administered 2016-05-14: 1 mg via INTRAVENOUS
  Filled 2016-05-14: qty 1

## 2016-05-14 NOTE — Progress Notes (Signed)
eLink Physician-Brief Progress Note Patient Name: Troy PascalJames Torres DOB: 1951-04-08 MRN: [id number]   Date of Service  05/14/2016  HPI/Events of Note  Pt with etoh w/d not responding to CIWA protocol on the floor/ note bp elevated but not febrile and pulse 100  eICU Interventions  Agree with precedex drip/ might add clonidine if taking po well to help wean the precedex off and treat the hbp at the same time. Will monitor in elink overnight / no other interventions needed      Intervention Category Major Interventions: Delirium, psychosis, severe agitation - evaluation and management  Sandrea HughsMichael Taishawn Smaldone 05/14/2016, 5:33 PM

## 2016-05-14 NOTE — Progress Notes (Signed)
Pt arrived in room 1225 at 1700.  Pt is disoriented/confused.  Pt is agitated and trying to pull his arms free from the soft wrist restraints.  CIWA is 25 and agitated.  Vital signs are stable and pt appears to be sinus tachy.  Started precedex gtt as ordered.  Erick Blinksuchman, Liam Bossman D, RN

## 2016-05-14 NOTE — Progress Notes (Addendum)
Patient withdrawing actively. Tachycardic to 120s, trying to get out of bed.  CIWA 19. On restraints.  Transfer to ICU and initiate precedex drip PCCM notified to monitor in periphery.

## 2016-05-14 NOTE — Progress Notes (Addendum)
PROGRESS NOTE                                                                                                                                                                                                             Patient Demographics:    Troy Torres, is a 65 y.o. male, DOB - May 06, 1951, JXB:[id number]  Admit date - 05/12/2016   Admitting Physician No admitting provider for patient encounter.  Outpatient Primary MD for the patient is No PCP Per Patient  LOS - 1  Outpatient Specialists:None  Chief Complaint  Patient presents with  . 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 His last drink was 2 days ago (drinks 4 ounces of water daily). Denied fever, chills, nausea, vomiting, chest, shortness of breath bile or urinary symptoms. He did complain of suprapubic pain and tenderness on exam. Patient found to be mildly encephalopathic with impaired judgment secondary to alcohol withdrawal (CIWA 11). Also had UTI. Labs showed mildly elevated lipase, low magnesium and low vitamin B12 level. Admitted to hospitalist service for further management. Patient going into active DTs.    Subjective:   Patient agitated overnight hallucinating. Ordered restraints and started on low-dose Librium.    Assessment  & Plan :    Principal Problem:   Alcohol withdrawal With early DTs (HCC)  CIWA of 19. Extremely confused and hallucinating. Added Librium, dose increased to 50 mg 3 times a day. Ativan when necessary. Continue aggressive IV hydration, thiamine, folate and multivitamin. Continue restraints. Patient somnolent this afternoon .Low threshold for ICU transfer .    Active  Problems:   UTI (lower urinary tract infection) Urine culture growing staph species. Pansensitive. Continue rocephin  Mild alcoholic pancreatitis No abdominal pain symptoms. Advanced diet as tolerated.       GERD (gastroesophageal reflux disease) Continue PPI  Hypomagnesemia/ hypokalemia Replenish  Vitamin B12 deficiency Level 175.  started supplement.   Tobacco abuse  nicotine patch.    Code Status : Full code  Family Communication  : None at bedside  Disposition Plan  : Home once acute symptoms resolved.  Barriers For Discharge : Active symptoms  Consults  :  None  Procedures  :None  DVT Prophylaxis  :  Lovenox -  Lab Results  Component Value Date   PLT 229 05/12/2016    Antibiotics  :   Anti-infectives    Start     Dose/Rate Route Frequency Ordered Stop   05/13/16 1400  cefTRIAXone (ROCEPHIN) 1 g in dextrose 5 % 50 mL IVPB     1 g 100 mL/hr over 30 Minutes Intravenous Every 24 hours 05/12/16 1846     05/12/16 1515  cefTRIAXone (ROCEPHIN) 1 g in dextrose 5 % 50 mL IVPB     1 g 100 mL/hr over 30 Minutes Intravenous  Once 05/12/16 1504 05/12/16 1549        Objective:   Vitals:   05/13/16 0436 05/13/16 1300 05/13/16 2332 05/14/16 0551  BP: (!) 154/88 (!) 145/86 (!) 147/89 (!) 157/92  Pulse: 78 87 (!) 102 84  Resp: 16 18 18 18   Temp: 98 F (36.7 C) 98.7 F (37.1 C) 97.7 F (36.5 C) 97.7 F (36.5 C)  TempSrc: Oral Oral Oral Oral  SpO2: 97% 100% 94% 100%  Weight:      Height:        Wt Readings from Last 3 Encounters:  05/12/16 64.1 kg (141 lb 4.8 oz)  03/12/15 66.6 kg (146 lb 12.8 oz)  03/06/13 65.7 kg (144 lb 14.4 oz)     Intake/Output Summary (Last 24 hours) at 05/14/16 1444 Last data filed at 05/14/16 0659  Gross per 24 hour  Intake             1010 ml  Output              550 ml  Net              460 ml     Physical Exam  Gen: Restless and agitated, hallucinating HEENT: Dry mucosa supple neck Chest: clear b/l, no added  sounds CVS: N S1&S2, no murmurs, rubs or gallop GI: soft, NT, ND, BS+ Musculoskeletal: warm, no edema CNS: Disoriented, restless    Data Review:    CBC  Recent Labs Lab 05/10/16 1035 05/11/16 2226 05/12/16 1230  WBC 8.0 5.9 9.3  HGB 14.2 13.1 12.8*  HCT 42.9 37.9* 37.2*  PLT 281 228 229  MCV 87.6 85.9 86.1  MCH 29.0 29.7 29.6  MCHC 33.1 34.6 34.4  RDW 14.8 14.8 14.9    Chemistries   Recent Labs Lab 05/10/16 1035 05/11/16 2226 05/12/16 1230 05/12/16 2004 05/13/16 0528 05/14/16 0815  NA 141 141 137  --  136 133*  K 4.9 3.9 4.6  --  3.8 3.5  CL 103 107 103  --  104 101  CO2 21* 22 21*  --  25 24  GLUCOSE 75 91 105*  --  93 108*  BUN 16 19 21*  --  20 16  CREATININE 1.32* 1.07 1.24  --  1.04 0.77  CALCIUM 9.4 8.7* 9.0  --  8.4* 8.9  MG  --   --   --  1.2*  --  1.6*  AST 32 33 40 34  --   --   ALT 16* 17 17 16*  --   --   ALKPHOS 83 75 77 67  --   --   BILITOT 0.7 0.4 1.0 1.0  --   --    ------------------------------------------------------------------------------------------------------------------ No results for input(s): CHOL, HDL, LDLCALC, TRIG, CHOLHDL, LDLDIRECT  in the last 72 hours.  Lab Results  Component Value Date   HGBA1C 5.4 09/26/2011   ------------------------------------------------------------------------------------------------------------------  Recent Labs  05/12/16 2004  TSH 1.769   ------------------------------------------------------------------------------------------------------------------  Recent Labs  05/12/16 2004  VITAMINB12 175*    Coagulation profile No results for input(s): INR, PROTIME in the last 168 hours.  No results for input(s): DDIMER in the last 72 hours.  Cardiac Enzymes No results for input(s): CKMB, TROPONINI, MYOGLOBIN in the last 168 hours.  Invalid input(s): CK ------------------------------------------------------------------------------------------------------------------ No results found  for: BNP  Inpatient Medications  Scheduled Meds: . cefTRIAXone (ROCEPHIN)  IV  1 g Intravenous Q24H  . chlordiazePOXIDE  50 mg Oral TID  . folic acid  1 mg Oral Daily  . heparin  5,000 Units Subcutaneous Q8H  . LORazepam  0-4 mg Intravenous Q6H   Followed by  . LORazepam  0-4 mg Intravenous Q12H  . multivitamin with minerals  1 tablet Oral Daily  . nicotine  14 mg Transdermal Daily  . pantoprazole  40 mg Oral BID  . thiamine  100 mg Oral Daily  . vitamin B-12  1,000 mcg Oral Daily   Continuous Infusions: . sodium chloride 125 mL/hr at 05/14/16 0942   PRN Meds:.acetaminophen **OR** acetaminophen, hydrALAZINE, LORazepam **OR** LORazepam, oxyCODONE  Micro Results Recent Results (from the past 240 hour(s))  Urine culture     Status: Abnormal   Collection Time: 05/12/16 12:51 PM  Result Value Ref Range Status   Specimen Description URINE, RANDOM  Final   Special Requests NONE  Final   Culture (A)  Final    >=100,000 COLONIES/mL STAPHYLOCOCCUS SPECIES (COAGULASE NEGATIVE)   Report Status 05/14/2016 FINAL  Final   Organism ID, Bacteria STAPHYLOCOCCUS SPECIES (COAGULASE NEGATIVE) (A)  Final      Susceptibility   Staphylococcus species (coagulase negative) - MIC*    CIPROFLOXACIN <=0.5 SENSITIVE Sensitive     GENTAMICIN <=0.5 SENSITIVE Sensitive     NITROFURANTOIN <=16 SENSITIVE Sensitive     OXACILLIN <=0.25 SENSITIVE Sensitive     TETRACYCLINE 2 SENSITIVE Sensitive     VANCOMYCIN 1 SENSITIVE Sensitive     TRIMETH/SULFA <=10 SENSITIVE Sensitive     CLINDAMYCIN <=0.25 SENSITIVE Sensitive     RIFAMPIN <=0.5 SENSITIVE Sensitive     Inducible Clindamycin NEGATIVE Sensitive     * >=100,000 COLONIES/mL STAPHYLOCOCCUS SPECIES (COAGULASE NEGATIVE)    Radiology Reports Dg Chest 2 View  Result Date: 05/10/2016 CLINICAL DATA:  Chest pain. EXAM: CHEST  2 VIEW COMPARISON:  04/17/2016 FINDINGS: The heart size and mediastinal contours are within normal limits. There is no evidence of  pulmonary edema, consolidation, pneumothorax, nodule or pleural fluid. The visualized skeletal structures are unremarkable. IMPRESSION: No active cardiopulmonary disease. Electronically Signed   By: Irish Lack M.D.   On: 05/10/2016 11:38   Dg Chest 2 View  Result Date: 04/17/2016 CLINICAL DATA:  Shortness of breath and chest pain for over 1 week. EXAM: CHEST  2 VIEW COMPARISON:  PA and lateral chest 03/11/2016 and 09/26/2011. FINDINGS: The lungs are clear. Heart size is normal. No pneumothorax or pleural effusion. No focal bony abnormality. IMPRESSION: No acute disease. Electronically Signed   By: Drusilla Kanner M.D.   On: 04/17/2016 16:58    Time Spent in minutes 35   Eddie North M.D on 05/14/2016 at 2:44 PM  Between 7am to 7pm - Pager - [phone]  After 7pm go to [email or web address] - password [facility]  [facility] -  Office  [phone]

## 2016-05-14 NOTE — Progress Notes (Signed)
This is a no charge note  I was asked to see pt by Dr. Joneen Roachrosley who is covering the pt on the floor remotely. Per RN, pt has been agitated, more nervous and pulling IV out. Pt was placed in restraints. When I saw pt on the floor, the sider is at bedside. Pt has calmed down.   Patient denies chest pain, abdominal pain, nausea or vomiting. He moves all extremities normally. Vital signs are stable, tachycardia with heart rate 102. No fever or chills.   Physical Exam:   Vitals:   05/12/16 2131 05/13/16 0436 05/13/16 1300 05/13/16 2332  BP: (!) 160/94 (!) 154/88 (!) 145/86 (!) 147/89  Pulse: 83 78 87 (!) 102  Resp: 16 16 18 18   Temp: 99.3 F (37.4 C) 98 F (36.7 C) 98.7 F (37.1 C) 97.7 F (36.5 C)  TempSrc: Oral Oral Oral Oral  SpO2: 100% 97% 100% 94%  Weight:      Height:        General: Not in acute distress HEENT: PERRL, EOMI, no scleral icterus, No JVD or bruit Cardiac: S1/S2, RRR, Tachycardia, No murmurs, gallops or rubs Pulm: clear to auscultation bilaterally. No rales, wheezing, rhonchi or rubs. Abd: Soft, nondistended, nontender, no rebound pain, no organomegaly, BS present Ext: No edema. 2+DP/PT pulse bilaterally Musculoskeletal: No joint deformities, erythema, or stiffness, ROM full Skin: No rashes.  Neuro: Alert and oriented X3, cranial nerves II-XII grossly intact, muscle strength 5/5 in all extremeties, sensation to light touch intact.   A &P: pt seems to have severe withdraw vs. DT, but hemodynamically stable. currently patient is calm. -Continue CIWA protocol -Add Librium 10 mg 3 times a day -Hydration  Lorretta HarpXilin Novalie Leamy, MD  [facility] Pager [phone]  If 7PM-7AM, please contact night-coverage [email or web address] Password [email or web address] 05/14/2016, 5:36 AM

## 2016-05-15 ENCOUNTER — Inpatient Hospital Stay (HOSPITAL_COMMUNITY): Payer: Medicare Other

## 2016-05-15 DIAGNOSIS — F10231 Alcohol dependence with withdrawal delirium: Principal | ICD-10-CM

## 2016-05-15 DIAGNOSIS — G9340 Encephalopathy, unspecified: Secondary | ICD-10-CM

## 2016-05-15 DIAGNOSIS — F10239 Alcohol dependence with withdrawal, unspecified: Secondary | ICD-10-CM

## 2016-05-15 LAB — COMPREHENSIVE METABOLIC PANEL
ALBUMIN: 3.3 g/dL — AB (ref 3.5–5.0)
ALK PHOS: 57 U/L (ref 38–126)
ALT: 16 U/L — AB (ref 17–63)
AST: 30 U/L (ref 15–41)
Anion gap: 7 (ref 5–15)
BUN: 11 mg/dL (ref 6–20)
CALCIUM: 8.2 mg/dL — AB (ref 8.9–10.3)
CO2: 23 mmol/L (ref 22–32)
CREATININE: 0.73 mg/dL (ref 0.61–1.24)
Chloride: 105 mmol/L (ref 101–111)
GFR calc non Af Amer: >60 mL/min (ref >60)
GLUCOSE: 98 mg/dL (ref 65–99)
Potassium: 3.9 mmol/L (ref 3.5–5.1)
SODIUM: 135 mmol/L (ref 135–145)
Total Bilirubin: 0.7 mg/dL (ref 0.3–1.2)
Total Protein: 6.1 g/dL — ABNORMAL LOW (ref 6.5–8.1)

## 2016-05-15 LAB — MAGNESIUM: Magnesium: 1.4 mg/dL — ABNORMAL LOW (ref 1.7–2.4)

## 2016-05-15 MED ORDER — SODIUM CHLORIDE 0.9 % IV SOLN
INTRAVENOUS | Status: DC
  Administered 2016-05-15: 11:00:00 via INTRAVENOUS

## 2016-05-15 MED ORDER — DEXMEDETOMIDINE HCL IN NACL 200 MCG/50ML IV SOLN: 0.2000 ug/kg/h | INTRAVENOUS | Status: DC

## 2016-05-15 MED ORDER — IPRATROPIUM-ALBUTEROL 0.5-2.5 (3) MG/3ML IN SOLN
3.0000 mL | Freq: Two times a day (BID) | RESPIRATORY_TRACT | Status: DC
  Administered 2016-05-16 (×2): 3 mL via RESPIRATORY_TRACT
  Filled 2016-05-15 (×2): qty 3

## 2016-05-15 MED ORDER — IPRATROPIUM-ALBUTEROL 0.5-2.5 (3) MG/3ML IN SOLN
3.0000 mL | Freq: Four times a day (QID) | RESPIRATORY_TRACT | Status: DC
  Administered 2016-05-15 (×2): 3 mL via RESPIRATORY_TRACT
  Filled 2016-05-15 (×2): qty 3

## 2016-05-15 MED ORDER — FAMOTIDINE IN NACL 20-0.9 MG/50ML-% IV SOLN
20.0000 mg | Freq: Two times a day (BID) | INTRAVENOUS | Status: DC
  Administered 2016-05-15 – 2016-05-22 (×15): 20 mg via INTRAVENOUS
  Filled 2016-05-15 (×16): qty 50

## 2016-05-15 MED ORDER — MAGNESIUM SULFATE 2 GM/50ML IV SOLN
2.0000 g | Freq: Once | INTRAVENOUS | Status: AC
  Administered 2016-05-15: 2 g via INTRAVENOUS
  Filled 2016-05-15: qty 50

## 2016-05-15 MED ORDER — LORAZEPAM 2 MG/ML IJ SOLN: 1.0000 mg | INTRAMUSCULAR | Status: DC | PRN

## 2016-05-15 NOTE — Progress Notes (Signed)
PROGRESS NOTE                                                                                                                                                                                                             Patient Demographics:    Troy Torres, is a 65 y.o. male, DOB - 20-Jun-1951, UJW:[id number]  Admit date - 05/12/2016   Admitting Physician Eddie NorthNishant Nadene Witherspoon, MD  Outpatient Primary MD for the patient is No PCP Per Patient  LOS - 2  Outpatient Specialists:None  Chief Complaint  Patient presents with  . 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 His last drink was 2 days ago (drinks 4 ounces of water daily). Denied fever, chills, nausea, vomiting, chest, shortness of breath bile or urinary symptoms. He did complain of suprapubic pain and tenderness on exam. Patient found to be mildly encephalopathic with impaired judgment secondary to alcohol withdrawal (CIWA 11). Also had UTI. Labs showed mildly elevated lipase, low magnesium and low vitamin B12 level. Admitted to hospitalist service for further management. Patient went into active DTs and was to hospital ICU on Precedex drip.    Subjective:   On Precedex drip.. Sedated but arousable and confused ([place]   Assessment  & Plan :    Principal Problem:   Alcohol withdrawal With  DTs (HCC) Transferred to ICU on Precedex drip. Continue IV hydration. Monitor electrolytes closely. Continue restraints. PC CM and following closely.    Active Problems:   UTI (lower urinary tract infection) Urine culture growing staph species. Pansensitive. Continue rocephin  Mild alcoholic  pancreatitis No abdominal pain symptoms. Currently nothing by mouth with active DTs. Monitor closely for hypoglycemia.       GERD (gastroesophageal reflux disease) Continue PPI  Hypomagnesemia/ hypokalemia Replenish  Vitamin B12 deficiency Level 175.  started supplement.   Tobacco abuse  nicotine patch.    Code Status : Full code  Family Communication  : None at bedside  Disposition Plan  : Home once acute symptoms resolved.  Barriers For Discharge : Active symptoms  Consults  :  None  Procedures  :None  DVT Prophylaxis  :  Lovenox -  Lab Results  Component Value Date   PLT 229 05/12/2016    Antibiotics  :   Anti-infectives    Start     Dose/Rate Route Frequency Ordered Stop   05/13/16 1400  cefTRIAXone (ROCEPHIN) 1 g in dextrose 5 % 50 mL IVPB  Status:  Discontinued     1 g 100 mL/hr over 30 Minutes Intravenous Every 24 hours 05/12/16 1846 05/15/16 1041   05/12/16 1515  cefTRIAXone (ROCEPHIN) 1 g in dextrose 5 % 50 mL IVPB     1 g 100 mL/hr over 30 Minutes Intravenous  Once 05/12/16 1504 05/12/16 1549        Objective:   Vitals:   05/15/16 0400 05/15/16 0447 05/15/16 0800 05/15/16 1100  BP: (!) 181/84  (!) 171/99 (!) 145/85  Pulse: (!) 53  (!) 56 (!) 54  Resp: 14  (!) 30 16  Temp:  98.4 F (36.9 C) 97.9 F (36.6 C)   TempSrc:  Axillary Axillary   SpO2: 96%  96% 97%  Weight:      Height:        Wt Readings from Last 3 Encounters:  05/12/16 64.1 kg (141 lb 4.8 oz)  03/12/15 66.6 kg (146 lb 12.8 oz)  03/06/13 65.7 kg (144 lb 14.4 oz)     Intake/Output Summary (Last 24 hours) at 05/15/16 1110 Last data filed at 05/15/16 1000  Gross per 24 hour  Intake          2416.28 ml  Output              300 ml  Net          2116.28 ml     Physical Exam  Gen: Somnolent but arousable to commands, confused HEENT: Dry mucosa, supple neck Chest: clear b/l, no added sounds CVS: N S1&S2, no murmurs, rubs or gallop GI: soft, NT, ND,  BS+ Musculoskeletal: warm, no edema CNS: Somnolent, disoriented    Data Review:    CBC  Recent Labs Lab 05/10/16 1035 05/11/16 2226 05/12/16 1230  WBC 8.0 5.9 9.3  HGB 14.2 13.1 12.8*  HCT 42.9 37.9* 37.2*  PLT 281 228 229  MCV 87.6 85.9 86.1  MCH 29.0 29.7 29.6  MCHC 33.1 34.6 34.4  RDW 14.8 14.8 14.9    Chemistries   Recent Labs Lab 05/10/16 1035 05/11/16 2226 05/12/16 1230 05/12/16 2004 05/13/16 0528 05/14/16 0815 05/15/16 0337  NA 141 141 137  --  136 133* 135  K 4.9 3.9 4.6  --  3.8 3.5 3.9  CL 103 107 103  --  104 101 105  CO2 21* 22 21*  --  25 24 23   GLUCOSE 75 91 105*  --  93 108* 98  BUN 16 19 21*  --  20 16 11   CREATININE 1.32* 1.07 1.24  --  1.04 0.77 0.73  CALCIUM 9.4 8.7* 9.0  --  8.4* 8.9 8.2*  MG  --   --   --  1.2*  --  1.6* 1.4*  AST 32 33 40 34  --   --  30  ALT 16* 17 17 16*  --   --  16*  ALKPHOS 83 75 77 67  --   --  57  BILITOT 0.7 0.4 1.0 1.0  --   --  0.7   ------------------------------------------------------------------------------------------------------------------ No results for input(s): CHOL, HDL, LDLCALC, TRIG, CHOLHDL, LDLDIRECT in  the last 72 hours.  Lab Results  Component Value Date   HGBA1C 5.4 09/26/2011   ------------------------------------------------------------------------------------------------------------------  Recent Labs  05/12/16 2004  TSH 1.769   ------------------------------------------------------------------------------------------------------------------  Recent Labs  05/12/16 2004  VITAMINB12 175*    Coagulation profile No results for input(s): INR, PROTIME in the last 168 hours.  No results for input(s): DDIMER in the last 72 hours.  Cardiac Enzymes No results for input(s): CKMB, TROPONINI, MYOGLOBIN in the last 168 hours.  Invalid input(s): CK ------------------------------------------------------------------------------------------------------------------ No results found  for: BNP  Inpatient Medications  Scheduled Meds: . folic acid  1 mg Oral Daily  . heparin  5,000 Units Subcutaneous Q8H  . multivitamin with minerals  1 tablet Oral Daily  . nicotine  14 mg Transdermal Daily  . pantoprazole  40 mg Oral BID  . thiamine  100 mg Oral Daily  . vitamin B-12  1,000 mcg Oral Daily   Continuous Infusions: . sodium chloride 75 mL/hr at 05/15/16 1056  . dexmedetomidine 0.2 mcg/kg/hr (05/15/16 1101)   PRN Meds:.acetaminophen **OR** acetaminophen, hydrALAZINE  Micro Results Recent Results (from the past 240 hour(s))  Urine culture     Status: Abnormal   Collection Time: 05/12/16 12:51 PM  Result Value Ref Range Status   Specimen Description URINE, RANDOM  Final   Special Requests NONE  Final   Culture (A)  Final    >=100,000 COLONIES/mL STAPHYLOCOCCUS SPECIES (COAGULASE NEGATIVE)   Report Status 05/14/2016 FINAL  Final   Organism ID, Bacteria STAPHYLOCOCCUS SPECIES (COAGULASE NEGATIVE) (A)  Final      Susceptibility   Staphylococcus species (coagulase negative) - MIC*    CIPROFLOXACIN <=0.5 SENSITIVE Sensitive     GENTAMICIN <=0.5 SENSITIVE Sensitive     NITROFURANTOIN <=16 SENSITIVE Sensitive     OXACILLIN <=0.25 SENSITIVE Sensitive     TETRACYCLINE 2 SENSITIVE Sensitive     VANCOMYCIN 1 SENSITIVE Sensitive     TRIMETH/SULFA <=10 SENSITIVE Sensitive     CLINDAMYCIN <=0.25 SENSITIVE Sensitive     RIFAMPIN <=0.5 SENSITIVE Sensitive     Inducible Clindamycin NEGATIVE Sensitive     * >=100,000 COLONIES/mL STAPHYLOCOCCUS SPECIES (COAGULASE NEGATIVE)  MRSA PCR Screening     Status: None   Collection Time: 05/14/16  5:45 PM  Result Value Ref Range Status   MRSA by PCR NEGATIVE NEGATIVE Final    Comment:        The GeneXpert MRSA Assay (FDA approved for NASAL specimens only), is one component of a comprehensive MRSA colonization surveillance program. It is not intended to diagnose MRSA infection nor to guide or monitor treatment for MRSA  infections.     Radiology Reports Dg Chest 2 View  Result Date: 05/10/2016 CLINICAL DATA:  Chest pain. EXAM: CHEST  2 VIEW COMPARISON:  04/17/2016 FINDINGS: The heart size and mediastinal contours are within normal limits. There is no evidence of pulmonary edema, consolidation, pneumothorax, nodule or pleural fluid. The visualized skeletal structures are unremarkable. IMPRESSION: No active cardiopulmonary disease. Electronically Signed   By: Irish LackGlenn  Yamagata M.D.   On: 05/10/2016 11:38   Dg Chest 2 View  Result Date: 04/17/2016 CLINICAL DATA:  Shortness of breath and chest pain for over 1 week. EXAM: CHEST  2 VIEW COMPARISON:  PA and lateral chest 03/11/2016 and 09/26/2011. FINDINGS: The lungs are clear. Heart size is normal. No pneumothorax or pleural effusion. No focal bony abnormality. IMPRESSION: No acute disease. Electronically Signed   By: Drusilla Kannerhomas  Dalessio M.D.   On: 04/17/2016 16:58  Time Spent in minutes 35   Eddie North M.D on 05/15/2016 at 11:10 AM  Between 7am to 7pm - Pager - [phone]  After 7pm go to [email or web address] - password [facility]  [facility] -  Office  [phone]

## 2016-05-15 NOTE — Progress Notes (Signed)
eLink Physician-Brief Progress Note Patient Name: Troy Torres DOB: [birth year] MRN: [id number]   Date of Service  05/15/2016  HPI/Events of Note  Patient weaned off of Precedex infusion for alcohol withdrawal. Currently not on  CIWA protocol.  eICU Interventions  1. Initiate CIWA protocol w/ Ativan IV prn 2. Holding Precedex infusion. 3. Goal RASS 0  4. Continue PO Vitamin B-12, Thiamine, & FA     Intervention Category Intermediate Interventions: Other:  Lawanda CousinsJennings Mahonri Seiden 05/15/2016, 4:16 PM

## 2016-05-15 NOTE — Consult Note (Signed)
PULMONARY / CRITICAL CARE MEDICINE   Name: Troy Torres MRN: [id number] DOB: Jun 04, 1951    ADMISSION DATE:  05/12/2016 CONSULTATION DATE:  05/15/16  REFERRING MD:  Dr. Gonzella Lexhungel   CHIEF COMPLAINT:  ETOH withdrawal   HISTORY OF PRESENT ILLNESS:   65 y/o M, smoker, with PMH of ETOH abuse (DT / seizures in the past), chronic diastolic CHF, IDA, GERD, lower GIB, esophagitis, prior blood transfusions, seizure and ICH secondary to trauma who [facility] with reports of abdominal pain and confusion.    The patient reported on admit he was having LUQ crampy lower abdominal pain.  Last drink was two days prior to admit.  Initial evaluation concerning for possible UTI, ETOH withdrawal and mild pancreatitis (Lipase 72).  Other notable labs - Mag 1.2, B12 175, AST 34, sr cr 1.24, ammonia 15.  [facility]  He was treated with CIWA protocol for withdrawal & rocephin for possible UTI.  He developed worsening confusion, hallucinations and agitation despite librium and ativan.  On 8/13, he was transferred to the ICU for further monitoring.  CIWA score rose to 25 overnight.  The patient was started on precedex gtt with improvement in agitation.  PCCM consulted 8/14 am for evaluation of withdrawal.    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  PAST SURGICAL HISTORY: He  has a past surgical history that includes Cataract extraction w/ intraocular lens  implant, bilateral (Bilateral, ~ 2014) and Esophagogastroduodenoscopy (N/A, 03/11/2015).  No Known Allergies  No current facility-administered medications on  file prior to encounter.    Current Outpatient Prescriptions on File Prior to Encounter  Medication Sig  . omeprazole (PRILOSEC) 20 MG capsule Take 1 capsule (20 mg total) by mouth 2 (two) times daily. (Patient not taking: Reported on 05/12/2016)    FAMILY HISTORY:  His indicated that the status of his mother is unknown. He indicated that the status of his father is unknown.    SOCIAL HISTORY: He  reports that he has been smoking Cigarettes.  He has a 46.00 pack-year smoking history. He has never used smokeless tobacco. He reports that he drinks about 6.0 oz of alcohol per week . He reports that he does not use drugs.  REVIEW OF SYSTEMS:  Unable to complete as patient is altered on precedex.    SUBJECTIVE:  RN reports weaning of precedex to 0.4 (reduction)  Drowsy this am.   VITAL SIGNS: BP (!) 171/99 (BP Location: Left Arm)   Pulse (!) 56   Temp 97.9 F (36.6 C) (Axillary)   Resp (!) 30   Ht 5\' 7"  (1.702 m)   Wt 141 lb 4.8 oz (64.1 kg)   SpO2 96%   BMI 22.13 kg/m   HEMODYNAMICS:    VENTILATOR SETTINGS:    INTAKE / OUTPUT: I/O last 3 completed shifts: In: 2758.5 [P.O.:600; I.V.:2108.5; IV Piggyback:50] Out: 650 [Urine:650]  PHYSICAL EXAMINATION: General:  Thin adult male in NAD Neuro:  Somnolent, arouses to physical stimulation, speech clear but not oriented  HEENT:  MM pink/dry, no jvd  Cardiovascular:  s1s2 rrr, no m/r/g  Lungs:  Even/non-labored, lungs bilaterally coarse  Abdomen:  Obese/soft, bsx4 active  Musculoskeletal:  No acute deformities  Skin:  Warm/dry, no edema   LABS:  BMET  Recent Labs Lab 05/13/16 0528 05/14/16 0815 05/15/16 0337  NA 136 133* 135  K 3.8 3.5 3.9  CL 104 101 105  CO2 25 24 23   BUN 20 16 11   CREATININE 1.04 0.77 0.73  GLUCOSE 93 108* 98    Electrolytes  Recent Labs Lab 05/12/16 2004 05/13/16 0528 05/14/16 0815 05/15/16 0337  CALCIUM  --  8.4* 8.9 8.2*  MG 1.2*  --  1.6* 1.4*  PHOS 2.7  --   --   --      CBC  Recent Labs Lab 05/10/16 1035 05/11/16 2226 05/12/16 1230  WBC 8.0 5.9 9.3  HGB 14.2 13.1 12.8*  HCT 42.9 37.9* 37.2*  PLT 281 228 229    Coag's No results for input(s): APTT, INR in the last 168 hours.  Sepsis Markers No results for input(s): LATICACIDVEN, PROCALCITON, O2SATVEN in the last 168 hours.  ABG No results for input(s): PHART, PCO2ART, PO2ART in the last 168 hours.  Liver Enzymes  Recent Labs Lab 05/12/16 1230 05/12/16 2004 05/15/16 0337  AST 40 34 30  ALT 17 16* 16*  ALKPHOS 77 67 57  BILITOT 1.0 1.0 0.7  ALBUMIN 4.2 3.6 3.3*    Cardiac Enzymes No results for input(s): TROPONINI, PROBNP in the last 168 hours.  Glucose No results for input(s): GLUCAP in the last 168 hours.  Imaging No results found.   STUDIES:    CULTURES: UC 8/11 >> greater 100k coag neg staph >> pan sensitive   ANTIBIOTICS: Rocephin 8/11 >> 8/14  Ancef 8/14 >>   SIGNIFICANT EVENTS: 8/11  Admit with abd pain 8/13  Tx to ICU for precedex with worsening agitation, CIWA 25  LINES/TUBES:   DISCUSSION: 65 y/o M with PMH of ETOH abuse admitted with UTI and ETOH withdrawal.  Worsening agitation / delirium 8/13 and transferred to ICU for precedex.   ASSESSMENT / PLAN:  PULMONARY A: At Risk Aspiration  Tobacco Abuse P:   NPO  Oral care per protocol  Intermittent CXR  Smoking cessation once patient can participate  duoneb qid.   CARDIOVASCULAR A:  Hypertension - intermittent, suspect agitation related P:  Monitor electrolytes  Tele monitoring PRN hydralazine   RENAL A:   Hypomagnesemia  P:   Trend BMP / UOP  Replace electrolytes as indicated Magnesium 2gm IV  NS @ 71ml/hr   GASTROINTESTINAL A:   Mild ETOH Pancreatitis  Protein Calorie Malnutrition  P:   NPO  Fluid resuscitation as above  Trend lipase PPI MVI   HEMATOLOGIC A:   Anemia - in setting of ETOH abuse  P:  Folate, B12, Thiamine  Trend CBC   INFECTIOUS A:   Mild  ETOH Pancreatitis  UTI (Coag-Neg Staph, pan sensitive)   P:   Trend fever curve  ABX as above > consider rpt urine culture. Staph could be a contaminant.  Monitor cultures  ENDOCRINE A:   At Risk Hypoglycemia  P:   Monitor glucose on BMP  NEUROLOGIC A:   ETOH Withdrawal  Agitated Delirium  P:   RASS goal: n/a  Precedex gtt for now Would like to wean precedex  > he seems sedated on low dose precedex. Try to wean off and place on prn ativan.  Consider restart CIWA in am 8/15 and d/c precedex   FAMILY  - Updates:  No family at bedside am 8/14.     -  Inter-disciplinary family meet or Palliative Care meeting due by:  8/21    Canary BrimBrandi Ollis, NP-C [facility] Pgr: [phone] or if no answer [phone] 05/15/2016, 10:16 AM   ATTENDING NOTE / ATTESTATION NOTE :   I have discussed the case with the resident/APP  Canary BrimBrandi Ollis.   I agree with the resident/APP's  history, physical examination, assessment, and plans.    I have edited the above note and modified it according to our agreed history, physical examination, assessment and plan. Briefly, pt admitted for etoh withdrawal. Controlled on precedex drip. Sedated. Protecting his airway. No other major issues.  Plan to wean off precedex and place back on CIWA and ativan.  No family at bedside.   I have spent 32  minutes of critical care time with this patient today.  Family :No family at bedside.     Pollie MeyerJ. Angelo A de Dios, MD 05/15/2016, 11:55 AM  Pulmonary and Critical Care Pager [phone] After 3 pm or if no answer, call [phone]

## 2016-05-15 NOTE — Progress Notes (Signed)
eLink Physician-Brief Progress Note Patient Name: Troy PascalJames Torres DOB: 10-02-1951 MRN: [id number]   Date of Service  05/15/2016  HPI/Events of Note  Patient NPO - request to advance diet. Admitted for "mild" pancreatitis.  eICU Interventions  Will continue NPO except for ice chips and sips with meds.      Intervention Category Intermediate Interventions: Other:  Lenell AntuSommer,Joda Braatz Eugene 05/15/2016, 11:55 PM

## 2016-05-16 LAB — BASIC METABOLIC PANEL
ANION GAP: 11 (ref 5–15)
BUN: 14 mg/dL (ref 6–20)
CHLORIDE: 105 mmol/L (ref 101–111)
CO2: 18 mmol/L — AB (ref 22–32)
Calcium: 8.3 mg/dL — ABNORMAL LOW (ref 8.9–10.3)
Creatinine, Ser: 0.85 mg/dL (ref 0.61–1.24)
GFR calc Af Amer: >60 mL/min (ref >60)
GLUCOSE: 70 mg/dL (ref 65–99)
POTASSIUM: 3.7 mmol/L (ref 3.5–5.1)
Sodium: 134 mmol/L — ABNORMAL LOW (ref 135–145)

## 2016-05-16 LAB — CBC
HCT: 34.4 % — ABNORMAL LOW (ref 39.0–52.0)
HEMOGLOBIN: 11.4 g/dL — AB (ref 13.0–17.0)
MCH: 28.8 pg (ref 26.0–34.0)
MCHC: 33.1 g/dL (ref 30.0–36.0)
MCV: 86.9 fL (ref 78.0–100.0)
PLATELETS: 137 10*3/uL — AB (ref 150–400)
RBC: 3.96 MIL/uL — AB (ref 4.22–5.81)
RDW: 14.6 % (ref 11.5–15.5)
WBC: 6.7 10*3/uL (ref 4.0–10.5)

## 2016-05-16 LAB — GLUCOSE, CAPILLARY: GLUCOSE-CAPILLARY: 126 mg/dL — AB (ref 65–99)

## 2016-05-16 LAB — MAGNESIUM: Magnesium: 1.6 mg/dL — ABNORMAL LOW (ref 1.7–2.4)

## 2016-05-16 LAB — PHOSPHORUS: Phosphorus: 3.2 mg/dL (ref 2.5–4.6)

## 2016-05-16 MED ORDER — LORAZEPAM 0.5 MG PO TABS: 0.5000 mg | ORAL_TABLET | Freq: Two times a day (BID) | ORAL | Status: DC

## 2016-05-16 MED ORDER — IPRATROPIUM-ALBUTEROL 0.5-2.5 (3) MG/3ML IN SOLN: 3.0000 mL | RESPIRATORY_TRACT | Status: DC | PRN

## 2016-05-16 MED ORDER — THIAMINE HCL 100 MG/ML IJ SOLN
100.0000 mg | Freq: Every day | INTRAMUSCULAR | Status: DC
  Administered 2016-05-16 – 2016-05-18 (×3): 100 mg via INTRAVENOUS
  Filled 2016-05-16 (×3): qty 2

## 2016-05-16 MED ORDER — LORAZEPAM 2 MG/ML IJ SOLN
1.0000 mg | INTRAMUSCULAR | Status: DC | PRN
  Administered 2016-05-16 – 2016-05-17 (×3): 2 mg via INTRAVENOUS
  Administered 2016-05-17: 4 mg via INTRAVENOUS
  Administered 2016-05-17 – 2016-05-18 (×3): 2 mg via INTRAVENOUS
  Administered 2016-05-18: 4 mg via INTRAVENOUS
  Administered 2016-05-18 – 2016-05-24 (×12): 2 mg via INTRAVENOUS
  Filled 2016-05-16 (×14): qty 1
  Filled 2016-05-16: qty 2
  Filled 2016-05-16 (×7): qty 1

## 2016-05-16 MED ORDER — CEFAZOLIN IN D5W 1 GM/50ML IV SOLN
1.0000 g | Freq: Three times a day (TID) | INTRAVENOUS | Status: AC
  Administered 2016-05-16 (×3): 1 g via INTRAVENOUS
  Filled 2016-05-16 (×3): qty 50

## 2016-05-16 MED ORDER — MAGNESIUM SULFATE 2 GM/50ML IV SOLN
2.0000 g | Freq: Once | INTRAVENOUS | Status: AC
  Administered 2016-05-16: 2 g via INTRAVENOUS
  Filled 2016-05-16: qty 50

## 2016-05-16 MED ORDER — POTASSIUM CHLORIDE 10 MEQ/100ML IV SOLN
10.0000 meq | INTRAVENOUS | Status: AC
  Administered 2016-05-16 (×3): 10 meq via INTRAVENOUS
  Filled 2016-05-16 (×3): qty 100

## 2016-05-16 MED ORDER — DEXTROSE-NACL 5-0.9 % IV SOLN
INTRAVENOUS | Status: DC
  Administered 2016-05-16 – 2016-05-23 (×12): via INTRAVENOUS

## 2016-05-16 MED ORDER — LORAZEPAM 2 MG/ML IJ SOLN
0.5000 mg | Freq: Two times a day (BID) | INTRAMUSCULAR | Status: DC
  Administered 2016-05-16 – 2016-05-23 (×12): 0.5 mg via INTRAVENOUS
  Filled 2016-05-16 (×12): qty 1

## 2016-05-16 MED ORDER — FOLIC ACID 5 MG/ML IJ SOLN
1.0000 mg | Freq: Every day | INTRAMUSCULAR | Status: DC
  Administered 2016-05-16 – 2016-05-22 (×7): 1 mg via INTRAVENOUS
  Filled 2016-05-16 (×8): qty 0.2

## 2016-05-16 NOTE — Progress Notes (Signed)
PROGRESS NOTE                                                                                                                                                                                                             Patient Demographics:    Troy Torres, is a 65 y.o. male, DOB - 20-Sep-1951, ZOX:[id number]  Admit date - 05/12/2016   Admitting Physician Eddie North, MD  Outpatient Primary MD for the patient is No PCP Per Patient  LOS - 3  Outpatient Specialists:None  Chief Complaint  Patient presents with  . 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 His last drink was 2 days ago (drinks 4 ounces of water daily). Denied fever, chills, nausea, vomiting, chest, shortness of breath bile or urinary symptoms. He did complain of suprapubic pain and tenderness on exam. Patient found to be mildly encephalopathic with impaired judgment secondary to alcohol withdrawal (CIWA 11). Also had UTI. Labs showed mildly elevated lipase, low magnesium and low vitamin B12 level. Admitted to hospitalist service for further management. Patient went into active DTs and was to hospital ICU on Precedex drip.    Subjective:   Weaned off Precedex. CIWA of 10 this morning. Able to tell where he lives.    Assessment  & Plan :    Principal Problem:   Alcohol withdrawal With  DTs (HCC) Transferred to ICU on Precedex drip. Now titrated off and placed on scheduled Ativan and CIWA monitoring.. Still withdrawing. Start clears. Continue hydration. Start clears once more awake able to tolerate.    Active Problems:   UTI (lower urinary tract infection) Staph species on culture, pansensitive.  Empiric Rocephin.  Mild alcoholic pancreatitis No abdominal symptoms. Currently nothing by mouth.     GERD (gastroesophageal reflux disease)  PPI  Hypomagnesemia/ hypokalemia Replenished  Vitamin B12 deficiency Level 175.  started supplement.   Tobacco abuse  nicotine patch.    Code Status : Full code  Family Communication  : None at bedside  Disposition Plan  : Home once acute symptoms resolved.  Barriers For Discharge : Active symptoms  Consults  :   PC CM  Procedures  :None  DVT Prophylaxis  :  Lovenox -  Lab Results  Component Value Date   PLT 137 (L) 05/16/2016    Antibiotics  :   Anti-infectives    Start     Dose/Rate Route Frequency Ordered Stop   05/16/16 0930  ceFAZolin (ANCEF) IVPB 1 g/50 mL premix     1 g 100 mL/hr over 30 Minutes Intravenous Every 8 hours 05/16/16 0847 05/17/16 0559   05/13/16 1400  cefTRIAXone (ROCEPHIN) 1 g in dextrose 5 % 50 mL IVPB  Status:  Discontinued     1 g 100 mL/hr over 30 Minutes Intravenous Every 24 hours 05/12/16 1846 05/15/16 1041   05/12/16 1515  cefTRIAXone (ROCEPHIN) 1 g in dextrose 5 % 50 mL IVPB     1 g 100 mL/hr over 30 Minutes Intravenous  Once 05/12/16 1504 05/12/16 1549        Objective:   Vitals:   05/16/16 0400 05/16/16 0500 05/16/16 0600 05/16/16 0800  BP: (!) 159/82 (!) 166/77 (!) 150/80   Pulse: 77 77 76   Resp: 19 17 16    Temp:    98.2 F (36.8 C)  TempSrc:    Axillary  SpO2: 98% 96% 96%   Weight:      Height:        Wt Readings from Last 3 Encounters:  05/12/16 64.1 kg (141 lb 4.8 oz)  03/12/15 66.6 kg (146 lb 12.8 oz)  03/06/13 65.7 kg (144 lb 14.4 oz)     Intake/Output Summary (Last 24 hours) at 05/16/16 1105 Last data filed at 05/16/16 0600  Gross per 24 hour  Intake           1537.8 ml  Output             1430 ml  Net            107.8 ml     Physical Exam  Gen: Somnolent but arousable, responsive to simple commands, HEENT: Dry mucosa, supple neck Chest: clear  b/l, no added sounds CVS: N S1&S2, no murmurs, rubs or gallop GI: soft, NT, ND, BS+ Musculoskeletal: warm, no edema CNS: Arousable to commands, oriented 1 , tremulous    Data Review:    CBC  Recent Labs Lab 05/10/16 1035 05/11/16 2226 05/12/16 1230 05/16/16 0316  WBC 8.0 5.9 9.3 6.7  HGB 14.2 13.1 12.8* 11.4*  HCT 42.9 37.9* 37.2* 34.4*  PLT 281 228 229 137*  MCV 87.6 85.9 86.1 86.9  MCH 29.0 29.7 29.6 28.8  MCHC 33.1 34.6 34.4 33.1  RDW 14.8 14.8 14.9 14.6    Chemistries   Recent Labs Lab 05/10/16 1035 05/11/16 2226 05/12/16 1230 05/12/16 2004 05/13/16 0528 05/14/16 0815 05/15/16 0337 05/16/16 0316  NA 141 141 137  --  136 133* 135 134*  K 4.9 3.9 4.6  --  3.8 3.5 3.9 3.7  CL 103 107 103  --  104 101 105 105  CO2 21* 22 21*  --  25 24 23  18*  GLUCOSE 75 91 105*  --  93 108* 98 70  BUN 16 19 21*  --  20 16 11 14   CREATININE 1.32* 1.07 1.24  --  1.04 0.77 0.73 0.85  CALCIUM 9.4 8.7* 9.0  --  8.4* 8.9 8.2* 8.3*  MG  --   --   --  1.2*  --  1.6* 1.4* 1.6*  AST 32 33 40 34  --   --  30  --   ALT 16* 17 17 16*  --   --  16*  --   ALKPHOS 83 75 77 67  --   --  57  --   BILITOT 0.7 0.4 1.0 1.0  --   --  0.7  --    ------------------------------------------------------------------------------------------------------------------ No results for input(s): CHOL, HDL, LDLCALC, TRIG, CHOLHDL, LDLDIRECT in the last 72 hours.  Lab Results  Component Value Date   HGBA1C 5.4 09/26/2011   ------------------------------------------------------------------------------------------------------------------ No results for input(s): TSH, T4TOTAL, T3FREE, THYROIDAB in the last 72 hours.  Invalid input(s): FREET3 ------------------------------------------------------------------------------------------------------------------ No results for input(s): VITAMINB12, FOLATE, FERRITIN, TIBC, IRON, RETICCTPCT in the last 72 hours.  Coagulation profile No results for input(s): INR,  PROTIME in the last 168 hours.  No results for input(s): DDIMER in the last 72 hours.  Cardiac Enzymes No results for input(s): CKMB, TROPONINI, MYOGLOBIN in the last 168 hours.  Invalid input(s): CK ------------------------------------------------------------------------------------------------------------------ No results found for: BNP  Inpatient Medications  Scheduled Meds: .  ceFAZolin (ANCEF) IV  1 g Intravenous Q8H  . famotidine (PEPCID) IV  20 mg Intravenous Q12H  . folic acid  1 mg Intravenous Daily  . heparin  5,000 Units Subcutaneous Q8H  . ipratropium-albuterol  3 mL Nebulization BID  . LORazepam  0.5 mg Intravenous BID  . multivitamin with minerals  1 tablet Oral Daily  . nicotine  14 mg Transdermal Daily  . potassium chloride  10 mEq Intravenous Q1 Hr x 3  . thiamine injection  100 mg Intravenous Daily  . vitamin B-12  1,000 mcg Oral Daily   Continuous Infusions: . dextrose 5 % and 0.9% NaCl 50 mL/hr at 05/16/16 0932   PRN Meds:.acetaminophen **OR** acetaminophen, hydrALAZINE, LORazepam  Micro Results Recent Results (from the past 240 hour(s))  Urine culture     Status: Abnormal   Collection Time: 05/12/16 12:51 PM  Result Value Ref Range Status   Specimen Description URINE, RANDOM  Final   Special Requests NONE  Final   Culture (A)  Final    >=100,000 COLONIES/mL STAPHYLOCOCCUS SPECIES (COAGULASE NEGATIVE)   Report Status 05/14/2016 FINAL  Final   Organism ID, Bacteria STAPHYLOCOCCUS SPECIES (COAGULASE NEGATIVE) (A)  Final      Susceptibility   Staphylococcus species (coagulase negative) - MIC*    CIPROFLOXACIN <=0.5 SENSITIVE Sensitive     GENTAMICIN <=0.5 SENSITIVE Sensitive     NITROFURANTOIN <=16 SENSITIVE Sensitive     OXACILLIN <=0.25 SENSITIVE Sensitive     TETRACYCLINE 2 SENSITIVE Sensitive     VANCOMYCIN 1 SENSITIVE Sensitive     TRIMETH/SULFA <=10 SENSITIVE Sensitive     CLINDAMYCIN <=0.25 SENSITIVE Sensitive     RIFAMPIN <=0.5 SENSITIVE  Sensitive     Inducible Clindamycin NEGATIVE Sensitive     * >=100,000 COLONIES/mL STAPHYLOCOCCUS SPECIES (COAGULASE NEGATIVE)  MRSA PCR Screening     Status: None   Collection Time: 05/14/16  5:45 PM  Result Value Ref Range Status   MRSA by PCR NEGATIVE NEGATIVE Final    Comment:        The GeneXpert MRSA Assay (FDA approved for NASAL specimens only), is one component of a comprehensive MRSA colonization surveillance program. It is not intended to diagnose MRSA infection nor to guide or monitor treatment for MRSA infections.     Radiology Reports Dg Chest 2 View  Result Date: 05/10/2016 CLINICAL DATA:  Chest pain. EXAM: CHEST  2 VIEW COMPARISON:  04/17/2016 FINDINGS: The heart size and mediastinal  contours are within normal limits. There is no evidence of pulmonary edema, consolidation, pneumothorax, nodule or pleural fluid. The visualized skeletal structures are unremarkable. IMPRESSION: No active cardiopulmonary disease. Electronically Signed   By: Irish Lack M.D.   On: 05/10/2016 11:38   Dg Chest 2 View  Result Date: 04/17/2016 CLINICAL DATA:  Shortness of breath and chest pain for over 1 week. EXAM: CHEST  2 VIEW COMPARISON:  PA and lateral chest 03/11/2016 and 09/26/2011. FINDINGS: The lungs are clear. Heart size is normal. No pneumothorax or pleural effusion. No focal bony abnormality. IMPRESSION: No acute disease. Electronically Signed   By: Drusilla Kanner M.D.   On: 04/17/2016 16:58   Dg Chest Port 1 View  Result Date: 05/15/2016 CLINICAL DATA:  Shortness of breath.  Altered mental status. EXAM: PORTABLE CHEST 1 VIEW COMPARISON:  05/10/2016. FINDINGS: Normal sized heart.  Clear lungs.  Unremarkable bones. IMPRESSION: No acute abnormality. Electronically Signed   By: Beckie Salts M.D.   On: 05/15/2016 12:48    Time Spent in minutes 35   Eddie North M.D on 05/16/2016 at 11:05 AM  Between 7am to 7pm - Pager - [phone]  After 7pm go to [email or web address] -  password [facility]  [facility] -  Office  [phone]

## 2016-05-16 NOTE — Progress Notes (Signed)
PULMONARY / CRITICAL CARE MEDICINE   Name: Troy Torres MRN: [id number] DOB: 07/20/1951    ADMISSION DATE:  05/12/2016 CONSULTATION DATE:  05/15/16  REFERRING MD:  Dr. Gonzella Lexhungel   CHIEF COMPLAINT:  ETOH withdrawal   HISTORY OF PRESENT ILLNESS:   65 y/o M, smoker, with PMH of ETOH abuse (DT / seizures in the past), chronic diastolic CHF, IDA, GERD, lower GIB, esophagitis, prior blood transfusions, seizure and ICH secondary to trauma who [facility] with reports of abdominal pain and confusion.    The patient reported on admit he was having LUQ crampy lower abdominal pain.  Last drink was two days prior to admit.  Initial evaluation concerning for possible UTI, ETOH withdrawal and mild pancreatitis (Lipase 72).  Other notable labs - Mag 1.2, B12 175, AST 34, sr cr 1.24, ammonia 15.  [facility]  He was treated with CIWA protocol for withdrawal & rocephin for possible UTI.  He developed worsening confusion, hallucinations and agitation despite librium and ativan.  On 8/13, he was transferred to the ICU for further monitoring.  CIWA score rose to 25 overnight.  The patient was started on precedex gtt with improvement in agitation.  PCCM consulted 8/14 am for evaluation of withdrawal.    SUBJECTIVE:   Weaned off precedex at 7 pm on 8/14.  CIWA protocol restarted.  Pt reports mild abd pain with coughing.   Over all looks comfortable and not in distress.   VITAL SIGNS: BP (!) 150/80   Pulse 76   Temp 98.2 F (36.8 C) (Axillary)   Resp 16   Ht 5\' 7"  (1.702 m)   Wt 141 lb 4.8 oz (64.1 kg)   SpO2 96%   BMI 22.13 kg/m   HEMODYNAMICS:    VENTILATOR SETTINGS:    INTAKE / OUTPUT: I/O last 3 completed shifts: In: 3069 [I.V.:2969; IV Piggyback:100] Out: 1630 [Urine:1630]  PHYSICAL EXAMINATION: General:  Thin adult male in NAD Neuro:  Alert, oriented to self, unable to state city, [place], speech clear, easily re-oriented  HEENT:  MM  pink/dry, no jvd  Cardiovascular:  s1s2 rrr, no m/r/g  Lungs:  Even/non-labored, lungs bilaterally diminished but clear  Abdomen:  Obese/soft, bsx4 active  Musculoskeletal:  No acute deformities  Skin:  Warm/dry, no edema   LABS:  BMET  Recent Labs Lab 05/14/16 0815 05/15/16 0337 05/16/16 0316  NA 133* 135 134*  K 3.5 3.9 3.7  CL 101 105 105  CO2 24 23 18*  BUN 16 11 14   CREATININE 0.77 0.73 0.85  GLUCOSE 108* 98 70    Electrolytes  Recent Labs Lab 05/12/16 2004  05/14/16 0815 05/15/16 0337 05/16/16 0316  CALCIUM  --   < > 8.9 8.2* 8.3*  MG 1.2*  --  1.6* 1.4* 1.6*  PHOS 2.7  --   --   --  3.2  < > = values in this interval not displayed.  CBC  Recent Labs Lab 05/11/16 2226 05/12/16 1230 05/16/16 0316  WBC 5.9 9.3 6.7  HGB 13.1 12.8* 11.4*  HCT 37.9* 37.2* 34.4*  PLT 228 229 137*    Coag's No results for input(s): APTT, INR in the last 168 hours.  Sepsis Markers No results for input(s): LATICACIDVEN, PROCALCITON, O2SATVEN in the last 168 hours.  ABG No results for input(s): PHART, PCO2ART, PO2ART in the last 168 hours.  Liver Enzymes  Recent Labs Lab 05/12/16 1230 05/12/16 2004 05/15/16 [id number]  AST 40 34 30  ALT 17 16* 16*  ALKPHOS 77 67 57  BILITOT 1.0 1.0 0.7  ALBUMIN 4.2 3.6 3.3*    Cardiac Enzymes No results for input(s): TROPONINI, PROBNP in the last 168 hours.  Glucose No results for input(s): GLUCAP in the last 168 hours.  Imaging Dg Chest Port 1 View  Result Date: 05/15/2016 CLINICAL DATA:  Shortness of breath.  Altered mental status. EXAM: PORTABLE CHEST 1 VIEW COMPARISON:  05/10/2016. FINDINGS: Normal sized heart.  Clear lungs.  Unremarkable bones. IMPRESSION: No acute abnormality. Electronically Signed   By: Beckie Salts M.D.   On: 05/15/2016 12:48     STUDIES:    CULTURES: UC 8/11 >> greater 100k coag neg staph >> pan sensitive   ANTIBIOTICS: Rocephin 8/11 >> 8/14  Ancef 8/14 >> 8/15   SIGNIFICANT  EVENTS: 8/11  Admit with abd pain 8/13  Tx to ICU for precedex with worsening agitation, CIWA 25 8/14  Weaned off precedex, CIWA restarted   LINES/TUBES:   DISCUSSION: 65 y/o M with PMH of ETOH abuse admitted with UTI and ETOH withdrawal.  Worsening agitation / delirium 8/13 and transferred to ICU for precedex.   ASSESSMENT / PLAN:  PULMONARY A: At Risk Aspiration  Tobacco Abuse P:   Aspiration precautions Oral care per protocol  Intermittent CXR  Smoking cessation once patient can participate  Duoneb QID > may wean off  CARDIOVASCULAR A:  Hypertension - intermittent, suspect agitation related P:  Monitor electrolytes  Tele monitoring PRN hydralazine   RENAL A:   Hypomagnesemia  P:   Trend BMP / UOP  Replace electrolytes as indicated Magnesium 2gm IV  Change IVF to D5NS @ 60ml/hr, KVO once taking PO's  GASTROINTESTINAL A:   Mild ETOH Pancreatitis  Protein Calorie Malnutrition  P:   Begin clear liquid diet, if tolerated convert meds to oral  Trend lipase PPI MVI   HEMATOLOGIC A:   Anemia - in setting of ETOH abuse  Mild Thrombocytopenia  P:  Folate, B12, Thiamine   Trend CBC   INFECTIOUS A:   Mild ETOH Pancreatitis  UTI (Coag-Neg Staph, pan sensitive)   P:   Trend fever curve  ABX as above > consider rpt urine culture. Staph could be a contaminant (condom cath).   D/C abx after 8/15 dosing  Monitor cultures  ENDOCRINE A:   At Risk Hypoglycemia  P:   Monitor glucose on BMP  NEUROLOGIC A:   ETOH Withdrawal  Agitated Delirium  P:   RASS goal: n/a  Continue CIWA protocol  Ativan 0.5 IV BID, transition to PO once taking PO's reliably    FAMILY  - Updates:  No family at bedside am 8/15.  Pt updated on plan of care.       - Inter-disciplinary family meet or Palliative Care meeting due by:  8/21   PCCM will sign off.  Please call if new needs arise.    Canary Brim, NP-C [facility] Pgr: [phone] or if no  answer [phone] 05/16/2016, 8:19 AM    ATTENDING NOTE / ATTESTATION NOTE :   I have discussed the case with the resident/APP Canary Brim.   I agree with the resident/APP's  history, physical examination, assessment, and plans.    I have edited the above note and modified it according to our agreed history, physical examination, assessment and plan.   Briefly, patient known to have alcohol abuse, admitted for alcohol withdrawal. Ended up requiring higher doses  of Ativan. He was transitioned to Precedex drip. Precedex drip has been weaned off. He is comfortable, protecting his airway, not in distress. Observe patient on CIWA protocol. Start scheduled Ativan IV, can transition to by mouth once he is tolerating diet. DC fluids once he is eating. Agree with stopping antibiotics. PCCM will sign off for now. Call back if with issues.   Family :No family at bedside. Extensively discussed the plan with the patient.   Pollie MeyerJ. Angelo A de Dios, MD 05/16/2016, 9:40 AM [facility] Pager [phone] After 3 pm or if no answer, call [phone]

## 2016-05-16 NOTE — Progress Notes (Signed)
Date:  May 16, 2016 Chart reviewed for concurrent status and case management needs. Will continue to follow the patient for changes and needs: Discharge Planning: following for needs Rhonda Davis, BSN, RN3, CCM   [phone] 

## 2016-05-17 LAB — BASIC METABOLIC PANEL
ANION GAP: 7 (ref 5–15)
BUN: 7 mg/dL (ref 6–20)
CO2: 22 mmol/L (ref 22–32)
Calcium: 8.8 mg/dL — ABNORMAL LOW (ref 8.9–10.3)
Chloride: 105 mmol/L (ref 101–111)
Creatinine, Ser: 0.93 mg/dL (ref 0.61–1.24)
GFR calc Af Amer: >60 mL/min (ref >60)
GFR calc non Af Amer: >60 mL/min (ref >60)
GLUCOSE: 161 mg/dL — AB (ref 65–99)
POTASSIUM: 3.9 mmol/L (ref 3.5–5.1)
Sodium: 134 mmol/L — ABNORMAL LOW (ref 135–145)

## 2016-05-17 LAB — CBC
HEMATOCRIT: 31.9 % — AB (ref 39.0–52.0)
HEMOGLOBIN: 10.7 g/dL — AB (ref 13.0–17.0)
MCH: 28.5 pg (ref 26.0–34.0)
MCHC: 33.5 g/dL (ref 30.0–36.0)
MCV: 85.1 fL (ref 78.0–100.0)
Platelets: 131 10*3/uL — ABNORMAL LOW (ref 150–400)
RBC: 3.75 MIL/uL — ABNORMAL LOW (ref 4.22–5.81)
RDW: 14.7 % (ref 11.5–15.5)
WBC: 6.3 10*3/uL (ref 4.0–10.5)

## 2016-05-17 LAB — LIPASE, BLOOD: Lipase: 51 U/L (ref 11–51)

## 2016-05-17 MED ORDER — HALOPERIDOL LACTATE 5 MG/ML IJ SOLN: 3.0000 mg | Freq: Once | INTRAMUSCULAR | Status: DC

## 2016-05-17 MED ORDER — HALOPERIDOL LACTATE 5 MG/ML IJ SOLN
5.0000 mg | Freq: Once | INTRAMUSCULAR | Status: AC
  Administered 2016-05-17: 5 mg via INTRAVENOUS

## 2016-05-17 MED ORDER — HALOPERIDOL LACTATE 5 MG/ML IJ SOLN
INTRAMUSCULAR | Status: AC
  Filled 2016-05-17: qty 1

## 2016-05-17 NOTE — Progress Notes (Signed)
Throughout the night, patient became increasingly more agitated.  CIWA increased from 12 at 0400 to 22 at 0445. Pt constantly attempting to get out of bed, oriented only to self, and impulsive.  4mg  IV ativan administered to no effect. [facility] NP paged.  Orders of 5mg  haldol and bilateral soft wrist restraints received. Pt now sleeping, but easily arousalable with a CIWA of 7. Will continue to monitor.

## 2016-05-17 NOTE — Progress Notes (Signed)
PROGRESS NOTE                                                                                                                                                                                                             Patient Demographics:    Troy Torres, is a 65 y.o. male, DOB - 1951-05-10, QMV:[id number]  Admit date - 05/12/2016   Admitting Physician Eddie NorthNishant Dhungel, MD  Outpatient Primary MD for the patient is No PCP Per Patient  LOS - 4  Outpatient Specialists:None  Chief Complaint  Patient presents with  . 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 His last drink was 2 days ago (drinks 4 ounces of water daily). Denied fever, chills, nausea, vomiting, chest, shortness of breath bile or urinary symptoms. He did complain of suprapubic pain and tenderness on exam. Patient found to be mildly encephalopathic with impaired judgment secondary to alcohol withdrawal (CIWA 11). Also had UTI. Labs showed mildly elevated lipase, low magnesium and low vitamin B12 level. Admitted to hospitalist service for further management. Patient went into active DTs and was to hospital ICU on Precedex drip.    Subjective:   Weaned off Precedex. Patient somnolent but when awaken provides no new complaints. Discussed with nursing and if patient more alert and cooperative may be transferred to floor.   Assessment  & Plan :    Principal Problem:   Alcohol withdrawal With  DTs (HCC) Transferred to ICU on Precedex drip. Now titrated off. Continue patient on scheduled Ativan and CIWA monitoring.   Active Problems:   UTI (lower urinary tract infection) Staph species on culture, pansensitive.  Empiric Rocephin.  Mild alcoholic pancreatitis No abdominal symptoms. Currently nothing by mouth.    GERD (gastroesophageal reflux disease)  PPI  Hypomagnesemia/ hypokalemia Replenished  Vitamin B12 deficiency Level 175.  started supplement.  Tobacco abuse  nicotine patch.  Code Status : Full code  Family Communication  : None at bedside  Disposition Plan  : Home once acute symptoms resolved.  Barriers For Discharge : Active symptoms  Consults  :   PC CM  Procedures  :None  DVT Prophylaxis  :  Lovenox -  Lab Results  Component Value Date   PLT 131 (L) 05/17/2016    Antibiotics  :   Anti-infectives    Start     Dose/Rate Route Frequency Ordered Stop   05/16/16 0930  ceFAZolin (ANCEF) IVPB 1 g/50 mL premix     1 g 100 mL/hr over 30 Minutes Intravenous Every 8 hours 05/16/16 0847 05/16/16 2328   05/13/16 1400  cefTRIAXone (ROCEPHIN) 1 g in dextrose 5 % 50 mL IVPB  Status:  Discontinued     1 g 100 mL/hr over 30 Minutes Intravenous Every 24 hours 05/12/16 1846 05/15/16 1041   05/12/16 1515  cefTRIAXone (ROCEPHIN) 1 g in dextrose 5 % 50 mL IVPB     1 g 100 mL/hr over 30 Minutes Intravenous  Once 05/12/16 1504 05/12/16 1549        Objective:   Vitals:   05/17/16 0405 05/17/16 0440 05/17/16 0617 05/17/16 0714  BP:  (!) 153/81 134/74   Pulse:  64 95   Resp:  (!) 24 20   Temp: 98.8 F (37.1 C)   98.8 F (37.1 C)  TempSrc: Oral   Axillary  SpO2:  99% 97%   Weight:      Height:        Wt Readings from Last 3 Encounters:  05/12/16 64.1 kg (141 lb 4.8 oz)  03/12/15 66.6 kg (146 lb 12.8 oz)  03/06/13 65.7 kg (144 lb 14.4 oz)     Intake/Output Summary (Last 24 hours) at 05/17/16 0924 Last data filed at 05/17/16 0600  Gross per 24 hour  Intake             2475 ml  Output             4700 ml  Net            -2225 ml     Physical Exam  Gen: Somnolent but arousable, responsive to simple commands HEENT: MMM, supple neck Chest: clear b/l, no added  sounds CVS: N S1&S2, no murmurs, rubs or gallop GI: soft, NT, ND, BS+ Musculoskeletal: warm, no edema CNS: no facial asymmetry, moves all extremities    Data Review:    CBC  Recent Labs Lab 05/10/16 1035 05/11/16 2226 05/12/16 1230 05/16/16 0316 05/17/16 0346  WBC 8.0 5.9 9.3 6.7 6.3  HGB 14.2 13.1 12.8* 11.4* 10.7*  HCT 42.9 37.9* 37.2* 34.4* 31.9*  PLT 281 228 229 137* 131*  MCV 87.6 85.9 86.1 86.9 85.1  MCH 29.0 29.7 29.6 28.8 28.5  MCHC 33.1 34.6 34.4 33.1 33.5  RDW 14.8 14.8 14.9 14.6 14.7    Chemistries   Recent Labs Lab 05/10/16 1035 05/11/16 2226 05/12/16 1230 05/12/16 2004 05/13/16 0528 05/14/16 0815 05/15/16 0337 05/16/16 0316 05/17/16 0346  NA 141 141 137  --  136 133* 135 134* 134*  K 4.9 3.9 4.6  --  3.8 3.5 3.9 3.7 3.9  CL 103 107 103  --  104 101 105 105 105  CO2 21* 22 21*  --  25 24 23  18* 22  GLUCOSE 75 91 105*  --  93 108* 98 70 161*  BUN 16 19 21*  --  20 16 11 14 7   CREATININE 1.32* 1.07 1.24  --  1.04 0.77 0.73 0.85 0.93  CALCIUM 9.4 8.7* 9.0  --  8.4* 8.9 8.2* 8.3* 8.8*  MG  --   --   --  1.2*  --  1.6* 1.4* 1.6*  --   AST 32 33 40 34  --   --  30  --   --   ALT 16* 17 17 16*  --   --  16*  --   --   ALKPHOS 83 75 77 67  --   --  57  --   --   BILITOT 0.7 0.4 1.0 1.0  --   --  0.7  --   --    ------------------------------------------------------------------------------------------------------------------ No results for input(s): CHOL, HDL, LDLCALC, TRIG, CHOLHDL, LDLDIRECT in the last 72 hours.  Lab Results  Component Value Date   HGBA1C 5.4 09/26/2011   ------------------------------------------------------------------------------------------------------------------ No results for input(s): TSH, T4TOTAL, T3FREE, THYROIDAB in the last 72 hours.  Invalid input(s): FREET3 ------------------------------------------------------------------------------------------------------------------ No results for input(s): VITAMINB12,  FOLATE, FERRITIN, TIBC, IRON, RETICCTPCT in the last 72 hours.  Coagulation profile No results for input(s): INR, PROTIME in the last 168 hours.  No results for input(s): DDIMER in the last 72 hours.  Cardiac Enzymes No results for input(s): CKMB, TROPONINI, MYOGLOBIN in the last 168 hours.  Invalid input(s): CK ------------------------------------------------------------------------------------------------------------------ No results found for: BNP  Inpatient Medications  Scheduled Meds: . famotidine (PEPCID) IV  20 mg Intravenous Q12H  . folic acid  1 mg Intravenous Daily  . heparin  5,000 Units Subcutaneous Q8H  . LORazepam  0.5 mg Intravenous BID  . multivitamin with minerals  1 tablet Oral Daily  . nicotine  14 mg Transdermal Daily  . thiamine injection  100 mg Intravenous Daily  . vitamin B-12  1,000 mcg Oral Daily   Continuous Infusions: . dextrose 5 % and 0.9% NaCl 50 mL/hr at 05/17/16 0454   PRN Meds:.acetaminophen **OR** acetaminophen, hydrALAZINE, ipratropium-albuterol, LORazepam  Micro Results Recent Results (from the past 240 hour(s))  Urine culture     Status: Abnormal   Collection Time: 05/12/16 12:51 PM  Result Value Ref Range Status   Specimen Description URINE, RANDOM  Final   Special Requests NONE  Final   Culture (A)  Final    >=100,000 COLONIES/mL STAPHYLOCOCCUS SPECIES (COAGULASE NEGATIVE)   Report Status 05/14/2016 FINAL  Final   Organism ID, Bacteria STAPHYLOCOCCUS SPECIES (COAGULASE NEGATIVE) (A)  Final      Susceptibility   Staphylococcus species (coagulase negative) - MIC*    CIPROFLOXACIN <=0.5 SENSITIVE Sensitive     GENTAMICIN <=0.5 SENSITIVE Sensitive     NITROFURANTOIN <=16 SENSITIVE Sensitive     OXACILLIN <=0.25 SENSITIVE Sensitive     TETRACYCLINE 2 SENSITIVE Sensitive     VANCOMYCIN 1 SENSITIVE Sensitive     TRIMETH/SULFA <=10 SENSITIVE Sensitive     CLINDAMYCIN <=0.25 SENSITIVE Sensitive     RIFAMPIN <=0.5 SENSITIVE Sensitive       Inducible Clindamycin NEGATIVE Sensitive     * >=100,000 COLONIES/mL STAPHYLOCOCCUS SPECIES (COAGULASE NEGATIVE)  MRSA PCR Screening     Status: None   Collection Time: 05/14/16  5:45 PM  Result Value Ref Range Status   MRSA by PCR NEGATIVE NEGATIVE Final    Comment:        The GeneXpert MRSA Assay (FDA approved for NASAL specimens only), is one component of a comprehensive MRSA colonization surveillance program. It is not intended to diagnose MRSA infection nor to guide or monitor treatment for MRSA infections.     Radiology Reports Dg Chest 2 View  Result Date: 05/10/2016 CLINICAL DATA:  Chest pain. EXAM: CHEST  2 VIEW COMPARISON:  04/17/2016 FINDINGS:  The heart size and mediastinal contours are within normal limits. There is no evidence of pulmonary edema, consolidation, pneumothorax, nodule or pleural fluid. The visualized skeletal structures are unremarkable. IMPRESSION: No active cardiopulmonary disease. Electronically Signed   By: Irish Lack M.D.   On: 05/10/2016 11:38   Dg Chest 2 View  Result Date: 04/17/2016 CLINICAL DATA:  Shortness of breath and chest pain for over 1 week. EXAM: CHEST  2 VIEW COMPARISON:  PA and lateral chest 03/11/2016 and 09/26/2011. FINDINGS: The lungs are clear. Heart size is normal. No pneumothorax or pleural effusion. No focal bony abnormality. IMPRESSION: No acute disease. Electronically Signed   By: Drusilla Kanner M.D.   On: 04/17/2016 16:58   Dg Chest Port 1 View  Result Date: 05/15/2016 CLINICAL DATA:  Shortness of breath.  Altered mental status. EXAM: PORTABLE CHEST 1 VIEW COMPARISON:  05/10/2016. FINDINGS: Normal sized heart.  Clear lungs.  Unremarkable bones. IMPRESSION: No acute abnormality. Electronically Signed   By: Beckie Salts M.D.   On: 05/15/2016 12:48    Time Spent in minutes 35   Penny Pia M.D on 05/17/2016 at 9:24 AM  Between 7am to 7pm - Pager - [phone]  After 7pm go to [email or web address] - password  [facility]  [facility] -  Office  [phone]

## 2016-05-18 DIAGNOSIS — F10232 Alcohol dependence with withdrawal with perceptual disturbance: Secondary | ICD-10-CM

## 2016-05-18 MED ORDER — ONDANSETRON 4 MG PO TBDP: 4.0000 mg | ORAL_TABLET | Freq: Four times a day (QID) | ORAL | Status: AC | PRN

## 2016-05-18 MED ORDER — CHLORDIAZEPOXIDE HCL 25 MG PO CAPS
25.0000 mg | ORAL_CAPSULE | Freq: Four times a day (QID) | ORAL | Status: AC
  Administered 2016-05-19 (×3): 25 mg via ORAL
  Filled 2016-05-18 (×3): qty 1

## 2016-05-18 MED ORDER — CEFTRIAXONE SODIUM 1 G IJ SOLR
1.0000 g | INTRAMUSCULAR | Status: DC
Start: 2016-05-18 — End: 2016-05-24
  Administered 2016-05-18 – 2016-05-23 (×6): 1 g via INTRAVENOUS
  Filled 2016-05-18 (×6): qty 10

## 2016-05-18 MED ORDER — CHLORDIAZEPOXIDE HCL 25 MG PO CAPS
25.0000 mg | ORAL_CAPSULE | Freq: Every day | ORAL | Status: AC
  Administered 2016-05-21: 25 mg via ORAL

## 2016-05-18 MED ORDER — CHLORDIAZEPOXIDE HCL 25 MG PO CAPS
25.0000 mg | ORAL_CAPSULE | Freq: Three times a day (TID) | ORAL | Status: AC
  Administered 2016-05-20 (×3): 25 mg via ORAL
  Filled 2016-05-18 (×3): qty 1

## 2016-05-18 MED ORDER — CHLORDIAZEPOXIDE HCL 25 MG PO CAPS: 25.0000 mg | ORAL_CAPSULE | Freq: Four times a day (QID) | ORAL | Status: AC | PRN

## 2016-05-18 MED ORDER — QUETIAPINE FUMARATE 25 MG PO TABS
50.0000 mg | ORAL_TABLET | Freq: Two times a day (BID) | ORAL | Status: DC
  Administered 2016-05-19 – 2016-05-24 (×10): 50 mg via ORAL
  Filled 2016-05-18 (×10): qty 2

## 2016-05-18 MED ORDER — VITAMIN B-1 100 MG PO TABS
100.0000 mg | ORAL_TABLET | Freq: Every day | ORAL | Status: DC
  Administered 2016-05-20 – 2016-05-24 (×5): 100 mg via ORAL
  Filled 2016-05-18 (×5): qty 1

## 2016-05-18 MED ORDER — CHLORDIAZEPOXIDE HCL 25 MG PO CAPS
25.0000 mg | ORAL_CAPSULE | ORAL | Status: AC
  Administered 2016-05-20: 25 mg via ORAL
  Filled 2016-05-18: qty 1

## 2016-05-18 MED ORDER — THIAMINE HCL 100 MG/ML IJ SOLN
100.0000 mg | Freq: Once | INTRAMUSCULAR | Status: AC
  Administered 2016-05-18: 100 mg via INTRAMUSCULAR
  Filled 2016-05-18: qty 2

## 2016-05-18 MED ORDER — HYDROXYZINE HCL 25 MG PO TABS: 25.0000 mg | ORAL_TABLET | Freq: Four times a day (QID) | ORAL | Status: AC | PRN

## 2016-05-18 MED ORDER — LOPERAMIDE HCL 2 MG PO CAPS: 2.0000 mg | ORAL_CAPSULE | ORAL | Status: AC | PRN

## 2016-05-18 MED ORDER — ADULT MULTIVITAMIN W/MINERALS CH
1.0000 | ORAL_TABLET | Freq: Every day | ORAL | Status: DC
  Administered 2016-05-20 – 2016-05-24 (×5): 1 via ORAL
  Filled 2016-05-18 (×5): qty 1

## 2016-05-18 NOTE — Progress Notes (Signed)
PROGRESS NOTE                                                                                                                                                                                                             Patient Demographics:    Troy Torres, is a 65 y.o. male, DOB - [birth year], [id number]:[id number]  Admit date - 05/12/2016   Admitting Physician Eddie North, MD  Outpatient Primary MD for the patient is No PCP Per Patient  LOS - 5  Outpatient Specialists:None  Chief Complaint  Patient presents with  . 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 His last drink was 2 days ago (drinks 4 ounces of water daily). Denied fever, chills, nausea, vomiting, chest, shortness of breath bile or urinary symptoms. He did complain of suprapubic pain and tenderness on exam. Patient found to be mildly encephalopathic with impaired judgment secondary to alcohol withdrawal (CIWA 11). Also had UTI. Labs showed mildly elevated lipase, low magnesium and low vitamin B12 level. Admitted to hospitalist service for further management. Patient went into active DTs and was to hospital ICU on Precedex drip.    Subjective:   Weaned off Precedex. Patient somnolent but when awaken provides no new complaints. Discussed with nursing and if patient more alert and cooperative may be transferred to floor.   Assessment  & Plan :    Principal Problem:   Alcohol withdrawal With  DTs (HCC) Continue monitoring in stepdown unit. Off precedex.  Continue patient on scheduled Ativan and CIWA monitoring.  Nursing reports patient has still been halucinating. Will consult Psychiatry for further assistance  with management for alcohol withdrawal.  Active Problems:   UTI (lower urinary tract infection) Staph species on culture, pansensitive. Empiric Rocephin.  Mild alcoholic pancreatitis No abdominal symptoms. Currently nothing by mouth.    GERD (gastroesophageal reflux disease)  PPI  Hypomagnesemia/ hypokalemia Replenished  Vitamin B12 deficiency Level 175.  started supplement.  Tobacco abuse  nicotine patch.  Code Status : Full code  Family Communication  : None at bedside  Disposition Plan  : Home once acute  symptoms resolved.  Barriers For Discharge : Active symptoms  Consults  :   PC CM  Procedures  :None  DVT Prophylaxis  :  Lovenox -  Lab Results  Component Value Date   PLT 131 (L) 05/17/2016    Antibiotics  :   Anti-infectives    Start     Dose/Rate Route Frequency Ordered Stop   05/16/16 0930  ceFAZolin (ANCEF) IVPB 1 g/50 mL premix     1 g 100 mL/hr over 30 Minutes Intravenous Every 8 hours 05/16/16 0847 05/16/16 2328   05/13/16 1400  cefTRIAXone (ROCEPHIN) 1 g in dextrose 5 % 50 mL IVPB  Status:  Discontinued     1 g 100 mL/hr over 30 Minutes Intravenous Every 24 hours 05/12/16 1846 05/15/16 1041   05/12/16 1515  cefTRIAXone (ROCEPHIN) 1 g in dextrose 5 % 50 mL IVPB     1 g 100 mL/hr over 30 Minutes Intravenous  Once 05/12/16 1504 05/12/16 1549        Objective:   Vitals:   05/18/16 0334 05/18/16 0400 05/18/16 0603 05/18/16 0738  BP: (!) 191/100 (!) 173/101 (!) 173/100 (!) 158/97  Pulse: (!) 111 (!) 117 (!) 114 (!) 109  Resp: (!) 25 17 (!) 21 (!) 22  Temp: 97.8 F (36.6 C)     TempSrc: Oral     SpO2: 100% 100% 100% 100%  Weight:      Height:        Wt Readings from Last 3 Encounters:  05/12/16 64.1 kg (141 lb 4.8 oz)  03/12/15 66.6 kg (146 lb 12.8 oz)  03/06/13 65.7 kg (144 lb 14.4 oz)     Intake/Output Summary (Last 24 hours) at 05/18/16 0828 Last data filed at 05/18/16 0730  Gross per 24 hour  Intake             1325 ml    Output             3250 ml  Net            -1925 ml     Physical Exam  Gen: Somnolent but arousable, responsive to simple commands HEENT: MMM, supple neck Chest: clear b/l, no added sounds CVS: N S1&S2, no murmurs, rubs or gallop GI: soft, NT, ND, BS+ Musculoskeletal: warm, no edema CNS: no facial asymmetry, moves all extremities    Data Review:    CBC  Recent Labs Lab 05/11/16 2226 05/12/16 1230 05/16/16 0316 05/17/16 0346  WBC 5.9 9.3 6.7 6.3  HGB 13.1 12.8* 11.4* 10.7*  HCT 37.9* 37.2* 34.4* 31.9*  PLT 228 229 137* 131*  MCV 85.9 86.1 86.9 85.1  MCH 29.7 29.6 28.8 28.5  MCHC 34.6 34.4 33.1 33.5  RDW 14.8 14.9 14.6 14.7    Chemistries   Recent Labs Lab 05/11/16 2226 05/12/16 1230 05/12/16 2004 05/13/16 0528 05/14/16 0815 05/15/16 0337 05/16/16 0316 05/17/16 0346  NA 141 137  --  136 133* 135 134* 134*  K 3.9 4.6  --  3.8 3.5 3.9 3.7 3.9  CL 107 103  --  104 101 105 105 105  CO2 22 21*  --  25 24 23  18* 22  GLUCOSE 91 105*  --  93 108* 98 70 161*  BUN 19 21*  --  20 16 11 14 7   CREATININE 1.07 1.24  --  1.04 0.77 0.73 0.85 0.93  CALCIUM 8.7* 9.0  --  8.4* 8.9 8.2* 8.3* 8.8*  MG  --   --  1.2*  --  1.6* 1.4* 1.6*  --   AST 33 40 34  --   --  30  --   --   ALT 17 17 16*  --   --  16*  --   --   ALKPHOS 75 77 67  --   --  57  --   --   BILITOT 0.4 1.0 1.0  --   --  0.7  --   --    ------------------------------------------------------------------------------------------------------------------ No results for input(s): CHOL, HDL, LDLCALC, TRIG, CHOLHDL, LDLDIRECT in the last 72 hours.  Lab Results  Component Value Date   HGBA1C 5.4 09/26/2011   ------------------------------------------------------------------------------------------------------------------ No results for input(s): TSH, T4TOTAL, T3FREE, THYROIDAB in the last 72 hours.  Invalid input(s):  FREET3 ------------------------------------------------------------------------------------------------------------------ No results for input(s): VITAMINB12, FOLATE, FERRITIN, TIBC, IRON, RETICCTPCT in the last 72 hours.  Coagulation profile No results for input(s): INR, PROTIME in the last 168 hours.  No results for input(s): DDIMER in the last 72 hours.  Cardiac Enzymes No results for input(s): CKMB, TROPONINI, MYOGLOBIN in the last 168 hours.  Invalid input(s): CK ------------------------------------------------------------------------------------------------------------------ No results found for: BNP  Inpatient Medications  Scheduled Meds: . famotidine (PEPCID) IV  20 mg Intravenous Q12H  . folic acid  1 mg Intravenous Daily  . heparin  5,000 Units Subcutaneous Q8H  . LORazepam  0.5 mg Intravenous BID  . multivitamin with minerals  1 tablet Oral Daily  . nicotine  14 mg Transdermal Daily  . thiamine injection  100 mg Intravenous Daily  . vitamin B-12  1,000 mcg Oral Daily   Continuous Infusions: . dextrose 5 % and 0.9% NaCl 50 mL/hr at 05/18/16 0730   PRN Meds:.acetaminophen **OR** acetaminophen, hydrALAZINE, ipratropium-albuterol, LORazepam  Micro Results Recent Results (from the past 240 hour(s))  Urine culture     Status: Abnormal   Collection Time: 05/12/16 12:51 PM  Result Value Ref Range Status   Specimen Description URINE, RANDOM  Final   Special Requests NONE  Final   Culture (A)  Final    >=100,000 COLONIES/mL STAPHYLOCOCCUS SPECIES (COAGULASE NEGATIVE)   Report Status 05/14/2016 FINAL  Final   Organism ID, Bacteria STAPHYLOCOCCUS SPECIES (COAGULASE NEGATIVE) (A)  Final      Susceptibility   Staphylococcus species (coagulase negative) - MIC*    CIPROFLOXACIN <=0.5 SENSITIVE Sensitive     GENTAMICIN <=0.5 SENSITIVE Sensitive     NITROFURANTOIN <=16 SENSITIVE Sensitive     OXACILLIN <=0.25 SENSITIVE Sensitive     TETRACYCLINE 2 SENSITIVE Sensitive      VANCOMYCIN 1 SENSITIVE Sensitive     TRIMETH/SULFA <=10 SENSITIVE Sensitive     CLINDAMYCIN <=0.25 SENSITIVE Sensitive     RIFAMPIN <=0.5 SENSITIVE Sensitive     Inducible Clindamycin NEGATIVE Sensitive     * >=100,000 COLONIES/mL STAPHYLOCOCCUS SPECIES (COAGULASE NEGATIVE)  MRSA PCR Screening     Status: None   Collection Time: 05/14/16  5:45 PM  Result Value Ref Range Status   MRSA by PCR NEGATIVE NEGATIVE Final    Comment:        The GeneXpert MRSA Assay (FDA approved for NASAL specimens only), is one component of a comprehensive MRSA colonization surveillance program. It is not intended to diagnose MRSA infection nor to guide or monitor treatment for MRSA infections.     Radiology Reports Dg Chest 2 View  Result Date: 05/10/2016 CLINICAL DATA:  Chest pain. EXAM: CHEST  2 VIEW COMPARISON:  04/17/2016 FINDINGS: The heart size and mediastinal  contours are within normal limits. There is no evidence of pulmonary edema, consolidation, pneumothorax, nodule or pleural fluid. The visualized skeletal structures are unremarkable. IMPRESSION: No active cardiopulmonary disease. Electronically Signed   By: Irish LackGlenn  Yamagata M.D.   On: 05/10/2016 11:38   Dg Chest Port 1 View  Result Date: 05/15/2016 CLINICAL DATA:  Shortness of breath.  Altered mental status. EXAM: PORTABLE CHEST 1 VIEW COMPARISON:  05/10/2016. FINDINGS: Normal sized heart.  Clear lungs.  Unremarkable bones. IMPRESSION: No acute abnormality. Electronically Signed   By: Beckie SaltsSteven  Reid M.D.   On: 05/15/2016 12:48    Time Spent in minutes 35   Penny PiaVEGA, Khiyan Crace M.D on 05/18/2016 at 8:28 AM  Between 7am to 7pm - Pager - [phone]  After 7pm go to [email or web address] - password [email or web address]  [facility] -  Office  [phone]

## 2016-05-18 NOTE — Consult Note (Signed)
[facility] Face-to-Face Psychiatry Consult   Reason for Consult:  Alcohol withdrawal and confusion Referring Physician:  Dr. Vega Patient Identification: Troy Torres MRN:  [id number] Principal Diagnosis: Alcohol withdrawal (HCC) Diagnosis:   Patient Active Problem List   Diagnosis Date Noted  . Hypomagnesemia [E83.42]   . Alcohol withdrawal (HCC) [F10.239] 05/12/2016  . UTI (lower urinary tract infection) [N39.0] 05/12/2016  . Acute encephalopathy [G93.40] 05/12/2016  . Abdominal pain [R10.9] 05/12/2016  . GERD (gastroesophageal reflux disease) [K21.9] 05/12/2016  . 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 Alcohol withdrawal delirium, acute, hyperactive (HCC) [F10.231] 05/12/2016  . Upper GI bleed [K92.2] 03/11/2015  . Acute GI bleeding [K92.2] 03/11/2015  . Acute blood loss anemia [D62] 03/11/2015  . Essential hypertension [I10] 03/11/2015  . Hypertension [I10] 01/29/2013  . Chronic diastolic heart failure (HCC) [I50.32]   . Pre-syncope [R55] 01/07/2013  . Iron deficiency anemia due to chronic blood loss [D50.0]   . Gastritis without bleeding [K29.70] 12/19/2011  . Gastric ulcer [K25.9] 12/19/2011  . Tobacco abuse [Z72.0] 10/26/2011  . History of seizure disorder [Z86.69] 09/26/2011    Total Time spent with patient: 30 minutes  Subjective:   Troy Torres is a 64 y.o. male patient admitted with abdominal pain alcohol withdrawal, confusion and restlessness.  HPI:  Troy Torres is a 64 y.o. male , seen face-to-face for psychiatric consultation and evaluation of alcoholic withdrawals syndrome including confusion, restlessness and required restraint with his waistband. Reportedly patient was admitted about 5-6 days ago with the limited response with his current medication treatment. Patient appeared to be in delirium tremens secondary to alcohol withdrawal symptoms. He is also receiving IV fluids, supportive therapy and Ativan detox treatment. Patient was not able to participate in  psychiatric evaluation secondary to altered mental status, confusion, restlessness and alcohol withdrawal symptoms. Patient has no known withdrawal seizure during this episode. Patient has no family members at bedside to provide collateral information. Information for this evaluation obtained from available medical records and also case discussed with the staff RN and LCSW.  Medical history: Patient with PMH significant for alcohol abuse (with hx of DT's and seizures in the past), GERD and tobacco abuse; who presented to ED complaining of abd pain. Patient reports pain was localized in his LUQ, crampy in nature, 8/10 in intensity and radiates to mid abdomen; no associated symptoms, nothing makes it better or worse. Patient reports last drink was 2 days or so ago due to ongoing pain in his abdomen. He denies fever, chills, nausea, vomiting, CP, SOB, hematochezia and melena. After physical exam initiated, pain was more present on his suprapubic region and mid abdomen; patient at this moment endorses some increase urine frequency, but denies any burning and hematuria. Patient was tremulous on exam and oriented X 2 only, judgement and insight appears to be impaired.   Past Psychiatric History: Alcohol abuse versus dependence and also history of delirium tremens and withdrawal seizures  Risk to Self: Is patient at risk for suicide?: No Risk to Others:   Prior Inpatient Therapy:   Prior Outpatient Therapy:    Past Medical History:  Past Medical History:  Diagnosis Date  . Alcohol abuse    with history of DTs // states last drink was 10/2012  . Angiodysplasia of cecum 10/2011   3 medium sized angiodysplastic lesions per colonoscopy (Dr. Hayes)  . Antral ulcer 10/2011   Small ulcer noted per Endoscopy (Dr. Hayes)  . Chronic diastolic heart failure (HCC)    grade 2 per 2D   echo (12/2012)  . Esophagitis 10/2011   per EGD (10/2011)  . History of blood product transfusion ~ 2014   LGIB  . Intracerebral  bleed due to trauma (HCC) 12/2006   History of fall in 2008 with resultant left frontal anteriocerebral hemorrhage and bifrontal subdural hematomas and SAH // Started on seizure prophylaxis at this time  . Iron deficiency anemia due to chronic blood loss    BL Hgb 12-13  . Lower GI bleeding 2014  . Seizure (HCC)    generalized major motor seizures // Started after Head injury 2008 // Followed at [facility] by Dr. Love  . Tobacco abuse     Past Surgical History:  Procedure Laterality Date  . CATARACT EXTRACTION W/ INTRAOCULAR LENS  IMPLANT, BILATERAL Bilateral ~ 2014  . ESOPHAGOGASTRODUODENOSCOPY N/A 03/11/2015   Procedure: ESOPHAGOGASTRODUODENOSCOPY (EGD);  Surgeon: Marc Magod, MD;  Location: [facility];  Service: Endoscopy;  Laterality: N/A;   Family History:  Family History  Problem Relation Age of Onset  . Diabetes Mother 60    deceased  . Diabetes Sister   . Heart attack Father 61    deceased  . Heart disease Sister 36   Family Psychiatric  History: Unable to obtain due to altered mental status. Social History:  History  Alcohol Use  . 6.0 oz/week  . 10 Shots of liquor per week    Comment: 03/11/2015 pt states he quit 10/2012; son states he's "drinking 1/2 gallon of alcohol, vodka, per 3 weeks"     History  Drug Use No    Social History   Social History  . Marital status: Divorced    Spouse name: N/A  . Number of children: 2  . Years of education: 9th grade   Occupational History  . Disability     Since 2010    Social History Main Topics  . Smoking status: Current Every Day Smoker    Packs/day: 1.00    Years: 46.00    Types: Cigarettes  . Smokeless tobacco: Never Used  . Alcohol use 6.0 oz/week    10 Shots of liquor per week     Comment: 03/11/2015 pt states he quit 10/2012; son states he's "drinking 1/2 gallon of alcohol, vodka, per 3 weeks"  . Drug use: No  . Sexual activity: Not Asked   Other Topics Concern  . None   Social History Narrative    Collects social security/disability.  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   Lives alone - he is not allowed to drive because of his prior head injury. His sister-in-law helps provide transportation.   Additional Social History:    Allergies:  No Known Allergies  Labs:  Results for orders placed or performed during the hospital encounter of 05/12/16 (from the past 48 hour(s))  Glucose, capillary     Status: Abnormal   Collection Time: 05/16/16 10:10 PM  Result Value Ref Range   Glucose-Capillary 126 (H) 65 - 99 mg/dL   Comment 1 Notify RN    Comment 2 Document in Chart   Lipase, blood     Status: None   Collection Time: 05/17/16  3:46 AM  Result Value Ref Range   Lipase 51 11 - 51 U/L  Basic metabolic panel     Status: Abnormal   Collection Time: 05/17/16  3:46 AM  Result Value Ref Range   Sodium 134 (L) 135 - 145 mmol/L   Potassium 3.9 3.5 -   5.1 mmol/L   Chloride 105 101 - 111 mmol/L   CO2 22 22 - 32 mmol/L   Glucose, Bld 161 (H) 65 - 99 mg/dL   BUN 7 6 - 20 mg/dL   Creatinine, Ser 0.93 0.61 - 1.24 mg/dL   Calcium 8.8 (L) 8.9 - 10.3 mg/dL   GFR calc non Af Amer >60 >60 mL/min   GFR calc Af Amer >60 >60 mL/min    Comment: (NOTE) The eGFR has been calculated using the CKD EPI equation. This calculation has not been validated in all clinical situations. eGFR's persistently <60 mL/min signify possible Chronic Kidney Disease.    Anion gap 7 5 - 15  CBC     Status: Abnormal   Collection Time: 05/17/16  3:46 AM  Result Value Ref Range   WBC 6.3 4.0 - 10.5 K/uL   RBC 3.75 (L) 4.22 - 5.81 MIL/uL   Hemoglobin 10.7 (L) 13.0 - 17.0 g/dL   HCT 31.9 (L) 39.0 - 52.0 %   MCV 85.1 78.0 - 100.0 fL   MCH 28.5 26.0 - 34.0 pg   MCHC 33.5 30.0 - 36.0 g/dL   RDW 14.7 11.5 - 15.5 %   Platelets 131 (L) 150 - 400 K/uL    Current Facility-Administered Medications  Medication Dose Route Frequency Provider Last Rate Last Dose  . acetaminophen (TYLENOL)  tablet 650 mg  650 mg Oral Q6H PRN Barton Dubois, MD       Or  . acetaminophen (TYLENOL) suppository 650 mg  650 mg Rectal Q6H PRN Barton Dubois, MD      . cefTRIAXone (ROCEPHIN) 1 g in dextrose 5 % 50 mL IVPB  1 g Intravenous Q24H Christine E Shade, RPH      . chlordiazePOXIDE (LIBRIUM) capsule 25 mg  25 mg Oral Q6H PRN Ambrose Finland, MD      . chlordiazePOXIDE (LIBRIUM) capsule 25 mg  25 mg Oral QID Ambrose Finland, MD       Followed by  . [START ON 05/20/2016] chlordiazePOXIDE (LIBRIUM) capsule 25 mg  25 mg Oral TID Ambrose Finland, MD       Followed by  . [START ON 05/21/2016] chlordiazePOXIDE (LIBRIUM) capsule 25 mg  25 mg Oral BH-qamhs Ambrose Finland, MD       Followed by  . [START ON 05/22/2016] chlordiazePOXIDE (LIBRIUM) capsule 25 mg  25 mg Oral Daily Ambrose Finland, MD      . dextrose 5 %-0.9 % sodium chloride infusion   Intravenous Continuous Donita Brooks, NP 50 mL/hr at 05/18/16 0730    . famotidine (PEPCID) IVPB 20 mg premix  20 mg Intravenous Q12H Nishant Dhungel, MD   20 mg at 05/18/16 1054  . folic acid injection 1 mg  1 mg Intravenous Daily Donita Brooks, NP   1 mg at 05/18/16 1054  . heparin injection 5,000 Units  5,000 Units Subcutaneous Q8H Barton Dubois, MD   5,000 Units at 05/18/16 1500  . hydrALAZINE (APRESOLINE) injection 10 mg  10 mg Intravenous Q8H PRN Barton Dubois, MD   10 mg at 05/18/16 0338  . hydrOXYzine (ATARAX/VISTARIL) tablet 25 mg  25 mg Oral Q6H PRN Ambrose Finland, MD      . ipratropium-albuterol (DUONEB) 0.5-2.5 (3) MG/3ML nebulizer solution 3 mL  3 mL Nebulization Q4H PRN Nishant Dhungel, MD      . loperamide (IMODIUM) capsule 2-4 mg  2-4 mg Oral PRN Ambrose Finland, MD      . LORazepam (ATIVAN) injection 0.5 mg  0.5 mg Intravenous BID Brandi L Ollis, NP   0.5 mg at 05/18/16 1054  . LORazepam (ATIVAN) injection 1-4 mg  1-4 mg Intravenous Q1H PRN Brandi L Ollis, NP   4 mg at 05/18/16 1141  .  multivitamin with minerals tablet 1 tablet  1 tablet Oral Daily  , MD      . nicotine (NICODERM CQ - dosed in mg/24 hours) patch 14 mg  14 mg Transdermal Daily Carlos Madera, MD   14 mg at 05/18/16 1055  . ondansetron (ZOFRAN-ODT) disintegrating tablet 4 mg  4 mg Oral Q6H PRN  , MD      . QUEtiapine (SEROQUEL) tablet 50 mg  50 mg Oral BID  , MD      . thiamine (B-1) injection 100 mg  100 mg Intramuscular Once  , MD      . [START ON 05/19/2016] thiamine (VITAMIN B-1) tablet 100 mg  100 mg Oral Daily  , MD      . vitamin B-12 (CYANOCOBALAMIN) tablet 1,000 mcg  1,000 mcg Oral Daily Nishant Dhungel, MD   1,000 mcg at 05/18/16 1054    Musculoskeletal: Strength & Muscle Tone: decreased Gait & Station: unable to stand Patient leans: N/A  Psychiatric Specialty Exam: Physical Exam as per history and physical   Review of Systems  Unable to perform ROS: Mental status change    Blood pressure (!) 146/85, pulse (!) 109, temperature 99.4 F (37.4 C), temperature source Oral, resp. rate (!) 24, height 5' 7" (1.702 m), weight 64.1 kg (141 lb 4.8 oz), SpO2 97 %.Body mass index is 22.13 kg/m.  General Appearance: Bizarre and Disheveled  Eye Contact:  Minimal  Speech:  Blocked, Garbled, Slow and Slurred  Volume:  Decreased  Mood:  Depressed  Affect:  Depressed and Restricted  Thought Process:  Coherent and Goal Directed  Orientation:  NA  Thought Content:  NA  Suicidal Thoughts:  No  Homicidal Thoughts:  No  Memory:  NA  Judgement:  NA  Insight:  NA  Psychomotor Activity:  Decreased  Concentration:  Concentration: NA and Attention Span: NA  Recall:  Poor  Fund of Knowledge:  NA  Language:  Fair  Akathisia:  Negative  Handed:  Right  AIMS (if indicated):     Assets:  Others:  To be determined  ADL's:  Others:  To be determined  Cognition:  Impaired,  Severe  Sleep:         Treatment Plan Summary: Patient has been suffering with alcohol withdrawal syndrome including delirium tremens and history of alcohol abuse and withdrawal seizures. Patient seems to be highly confused, hallucinations and unable to provide history at this time.  Safety concerns: Patient has high fall risk but unknown self-injurious behaviors Delirium tremens: Patient will receive supportive therapy and we start Librium protocol with tapering dose of Librium for alcohol withdrawal symptoms and also Seroquel 50 mg twice daily for alcohol-induced hallucinations Appreciate psychiatric consultation and follow up as clinically required Please contact [phone] or [phone] if needs further assistance   Disposition: Patient benefit from substance abuse rehabilitation therapy when medically stable No evidence of imminent risk to self or others at present.   Supportive therapy provided about ongoing stressors.   , MD 05/18/2016 4:49 PM 

## 2016-05-18 NOTE — Progress Notes (Signed)
LCSWA, Psychiatrist met with patient at bedside.Paitent is not alert and oriented and unable to complete assesment. Per RN, patient has been hallucinating for several days. LCSWA will attempt to gather collateral information from listed contacts.

## 2016-05-18 NOTE — Progress Notes (Signed)
Patient's right foot is edematous and warm to the touch. Was told in report patient at one point was complaining of pain.  Paged MD regarding findings.  Will continue to monitor.

## 2016-05-18 NOTE — Progress Notes (Signed)
Pharmacy Antibiotic Note  Troy Torres is a 65 y.o. male admitted on 05/12/2016 with alcohol withdrawal.  He was treated with ceftriaxone/cefazolin for pansensitive CNS UTI.  Now, he is noted today to have cellulitis of his right foot and Pharmacy has been consulted for ceftriaxone dosing.  Plan: Ceftriaxone 1g IV q24h Follow up clinical course, duration, narrowing.  Height: 5\' 7"  (170.2 cm) Weight: 141 lb 4.8 oz (64.1 kg) IBW/kg (Calculated) : 66.1  Temp (24hrs), Avg:98.5 F (36.9 C), Min:97.4 F (36.3 C), Max:99.4 F (37.4 C)   Recent Labs Lab 05/11/16 2226 05/12/16 1230 05/13/16 0528 05/14/16 0815 05/15/16 0337 05/16/16 0316 05/17/16 0346  WBC 5.9 9.3  --   --   --  6.7 6.3  CREATININE 1.07 1.24 1.04 0.77 0.73 0.85 0.93    Estimated Creatinine Clearance: 72.8 mL/min (by C-G formula based on SCr of 0.93 mg/dL).    No Known Allergies  Antimicrobials this admission: 8/11 Ceftriaxone >> 8/14 8/15 Cefazolin >> 8/15 8/17 Ceftriaxone >>   Dose adjustments this admission:   Microbiology results: 8/11 UCx: >100,000 pan-sens Coagulase Neg Staph  8/13 MRSA PCR: negative  Thank you for allowing pharmacy to be a part of this patient's care.  Lynann Beaverhristine Demontrae Gilbert PharmD, BCPS Pager [phone] 05/18/2016 4:46 PM

## 2016-05-19 ENCOUNTER — Inpatient Hospital Stay (HOSPITAL_COMMUNITY): Payer: Medicare Other

## 2016-05-19 LAB — CBC WITH DIFFERENTIAL/PLATELET
Basophils Absolute: 0 10*3/uL (ref 0.0–0.1)
Basophils Relative: 0 %
Eosinophils Absolute: 0 10*3/uL (ref 0.0–0.7)
Eosinophils Relative: 0 %
HEMATOCRIT: 32.9 % — AB (ref 39.0–52.0)
Hemoglobin: 11.4 g/dL — ABNORMAL LOW (ref 13.0–17.0)
LYMPHS ABS: 0.9 10*3/uL (ref 0.7–4.0)
LYMPHS PCT: 12 %
MCH: 29.7 pg (ref 26.0–34.0)
MCHC: 34.7 g/dL (ref 30.0–36.0)
MCV: 85.7 fL (ref 78.0–100.0)
MONO ABS: 2 10*3/uL — AB (ref 0.1–1.0)
MONOS PCT: 26 %
NEUTROS ABS: 4.7 10*3/uL (ref 1.7–7.7)
Neutrophils Relative %: 62 %
Platelets: 212 10*3/uL (ref 150–400)
RBC: 3.84 MIL/uL — ABNORMAL LOW (ref 4.22–5.81)
RDW: 14.6 % (ref 11.5–15.5)
WBC: 7.5 10*3/uL (ref 4.0–10.5)

## 2016-05-19 LAB — COMPREHENSIVE METABOLIC PANEL
ALT: 11 U/L — ABNORMAL LOW (ref 17–63)
ANION GAP: 9 (ref 5–15)
AST: 15 U/L (ref 15–41)
Albumin: 3.2 g/dL — ABNORMAL LOW (ref 3.5–5.0)
Alkaline Phosphatase: 46 U/L (ref 38–126)
BILIRUBIN TOTAL: 0.9 mg/dL (ref 0.3–1.2)
BUN: 11 mg/dL (ref 6–20)
CO2: 25 mmol/L (ref 22–32)
Calcium: 9.3 mg/dL (ref 8.9–10.3)
Chloride: 102 mmol/L (ref 101–111)
Creatinine, Ser: 0.92 mg/dL (ref 0.61–1.24)
GFR calc Af Amer: >60 mL/min (ref >60)
Glucose, Bld: 118 mg/dL — ABNORMAL HIGH (ref 65–99)
POTASSIUM: 3.9 mmol/L (ref 3.5–5.1)
Sodium: 136 mmol/L (ref 135–145)
TOTAL PROTEIN: 7 g/dL (ref 6.5–8.1)

## 2016-05-19 LAB — APTT: aPTT: 35 seconds (ref 24–36)

## 2016-05-19 LAB — LACTIC ACID, PLASMA
LACTIC ACID, VENOUS: 0.8 mmol/L (ref 0.5–1.9)
Lactic Acid, Venous: 0.8 mmol/L (ref 0.5–1.9)

## 2016-05-19 LAB — PROTIME-INR
INR: 1.05
PROTHROMBIN TIME: 13.7 s (ref 11.4–15.2)

## 2016-05-19 LAB — PROCALCITONIN: Procalcitonin: <0.1 ng/mL

## 2016-05-19 MED ORDER — VANCOMYCIN HCL IN DEXTROSE 750-5 MG/150ML-% IV SOLN
750.0000 mg | Freq: Two times a day (BID) | INTRAVENOUS | Status: DC
  Administered 2016-05-19 – 2016-05-21 (×5): 750 mg via INTRAVENOUS
  Filled 2016-05-19 (×6): qty 150

## 2016-05-19 NOTE — Progress Notes (Signed)
RRT notified of SIRS criteria at 0817. CXR being performed at this time. Will continue to monitor.

## 2016-05-19 NOTE — Progress Notes (Signed)
Pharmacy Antibiotic Note  Troy PascalJames Torres is a 65 y.o. male admitted on 05/12/2016 with alcohol withdrawal. He was treated with ceftriaxone/cefazolin for pansensitive CNS UTI.  On 8/17, he was noted today to have cellulitis of his right foot and Pharmacy was been consulted for ceftriaxone dosing. Pharmacy has now been consulted for vancomycin for developing sepsis.  Plan: Vancomycin 750 mg IV every 12 hours.  Goal trough 15-20 mcg/mL.  Check trough at steady state Follow up renal function & cultures, de-escalate as appropriate Continue ceftriaxone as ordered  Height: 5\' 7"  (170.2 cm) Weight: 141 lb 4.8 oz (64.1 kg) IBW/kg (Calculated) : 66.1  Temp (24hrs), Avg:100.1 F (37.8 C), Min:97.4 F (36.3 C), Max:102.3 F (39.1 C)   Recent Labs Lab 05/12/16 1230 05/13/16 0528 05/14/16 0815 05/15/16 0337 05/16/16 0316 05/17/16 0346  WBC 9.3  --   --   --  6.7 6.3  CREATININE 1.24 1.04 0.77 0.73 0.85 0.93    Estimated Creatinine Clearance: 72.8 mL/min (by C-G formula based on SCr of 0.93 mg/dL).    No Known Allergies  Antimicrobials this admission: 8/11 Ceftriaxone >> 8/14 8/15 Cefazolin >> 8/15 8/17 Ceftriaxone >>  8/18 Vancomycin >>  Dose adjustments this admission: ---  Microbiology results: 8/11 UCx: >100,000 pan-sens Coagulase Neg Staph  8/13 MRSA PCR: negative 8/18 BCx: sent   Thank you for allowing pharmacy to be a part of this patient's care.  Loralee PacasErin Emerson Schreifels, PharmD, BCPS Pager: [phone] 05/19/2016 8:36 AM

## 2016-05-19 NOTE — Progress Notes (Signed)
MD made aware that patient has not voided this shift. Bladder scan shows 333 ml. Condom cath in place and intact, draining only a small amount of clear, yellow urine. No leak. Order to increase IVF with no I/o cath at this time. Will continue to monitor.

## 2016-05-19 NOTE — Progress Notes (Signed)
Pt temperature 101.7 axillary. Tylenol suppository given. Heart rate increased. Dr. Cena BentonVega paged and made aware. Will continue to monitor patient.

## 2016-05-19 NOTE — Clinical Social Work Psych Assess (Addendum)
Clinical Social Work Nature conservation officer  Clinical Social Worker:  Lia Hopping, LCSW Date/Time:  05/19/2016, 10:20 AM Referred By:  Clinical Social Work, Care Management Date Referred:  05/18/16 Reason for Referral:  Substance Abuse   Presenting Symptoms/Problems  Presenting Symptoms/Problems(in person's/family's own words):   Significant for alcohol abuse (with hx of DT's and seizures in the past), GERD and tobacco abuse; who presented to ED complaining of abd pain. Patient reports pain was localized in his LUQ, crampy in nature, 8/10 in intensity and radiates to mid abdomen; no associated symptoms, nothing makes it better or worse.    Abuse/Neglect/Trauma History  Abuse/Neglect/Trauma History:  Denies History Abuse/Neglect/Trauma History Comments (indicate dates): Patient suffered a head injury in 2007 after slipping in shower.    Psychiatric History  Psychiatric History:  Denies History Psychiatric Medication: Patient and sister in law deny patient has taken psychiatric medications.    Current Mental Health Hospitalizations/Previous Mental Health History:  Patient denies history. No mental health hospitalizations documented in chart.    Current Provider:  N/A Place and Date:  N/A  Current Medications:   Legend:                    Inactive   Active   Linked          Medications 05/19/16 05/20/16 05/21/16 05/22/16 05/23/16 05/24/16 05/25/16  cefTRIAXone (ROCEPHIN) 1 g in dextrose 5 % 50 mL IVPB Dose: 1 g Freq: Every 24 hours Route: IV Start: 05/18/16 1700   Admin Instructions:  Note:Not compatible with LR containing fluids   1700    1700    1700    1700    1700    1700    1700     chlordiazePOXIDE (LIBRIUM) capsule 25 mg Dose: 25 mg Freq: 4 times daily Route: PO Start: 05/18/16 1800 End: 05/20/16 0959   (1049)[C]   1411   1800   2200    0959-D/C'd         Followed by chlordiazePOXIDE (LIBRIUM) capsule 25 mg Dose:  25 mg Freq: 3 times daily Route: PO Start: 05/20/16 1000 End: 05/21/16 0959    1000   1600   2200    0959-D/C'd        Followed by chlordiazePOXIDE (LIBRIUM) capsule 25 mg Dose: 25 mg Freq: BH-q am and hs Route: PO Start: 05/21/16 1000 End: 05/22/16 0759     1000   2200    0759-D/C'd       Followed by chlordiazePOXIDE (LIBRIUM) capsule 25 mg Dose: 25 mg Freq: Daily Route: PO Start: 05/22/16 1000 End: 05/23/16 0959      1000    0959-D/C'd      famotidine (PEPCID) IVPB 20 mg premix Dose: 20 mg Freq: Every 12 hours Route: IV Start: 05/15/16 1130   1045   2200    1000   2200    1000   2200    1000   2200    1000   2200    1000   2200    5885   0277     folic acid injection 1 mg Dose: 1 mg Freq: Daily Route: IV Start: 05/16/16 1000   1044    1000    1000    1000    1000    1000    1000     heparin injection 5,000 Units Dose: 5,000 Units Freq: Every 8 hours Route: [place] Start: 05/12/16 2200  0605   1411   2200    0600   1400   2200    0600   1400   2200    0600   1400   2200    0600   1400   2200    0600   1400   2200    0600   1400   2200     LORazepam (ATIVAN) injection 0.5 mg Dose: 0.5 mg Freq: 2 times daily Route: IV Start: 05/16/16 1000   Admin Instructions:  For IV use - Dilute to 47m/ml with NS and push 115mmin.   (1050)[C]   2200    1000   2200    1000   2200    1000   2200    1000   2200    1000   2200    1000   2200     multivitamin with minerals tablet 1 tablet Dose: 1 tablet Freq: Daily Route: PO Start: 05/18/16 1800   (1050)[C]    1000    1000    1000    1000    1000    1000     nicotine (NICODERM CQ - dosed in mg/24 hours) patch 14 mg Dose: 14 mg Freq: Daily Route: TD Start: 05/12/16 2000   Admin Instructions:  Remove old patch before applying new patch   1044   1050    0959   1000    1000    1000    1000    1000     1000     QUEtiapine (SEROQUEL) tablet 50 mg Dose: 50 mg Freq: 2 times daily Route: PO Start: 05/18/16 2200   (1051)[C]   2200    1000   2200    1000   2200    1000   2200    1000   2200    1000   2200    1000   2200     thiamine (VITAMIN B-1) tablet 100 mg Dose: 100 mg Freq: Daily Route: PO Start: 05/19/16 1000   (1051)[C]    1000    1000    1000    1000    1000    1000     vancomycin (VANCOCIN) IVPB 750 mg/150 ml premix Dose: 750 mg Freq: Every 12 hours Route: IV Start: 05/19/16 1000   Admin Instructions:  Compatible with Zosyn   1045   2200    1000   2200    1000   2200    1000   2200    1000   2200    1000   2200    1000   2200     vitamin B-12 (CYANOCOBALAMIN) tablet 1,000 mcg Dose: 1,000 mcg Freq: Daily Route: PO Start: 05/13/16 1200   (1051)[C]    1000    1000    1000    1000    1000    1000     Medications 05/19/16 05/20/16 05/21/16 05/22/16 05/23/16 05/24/16 05/25/16      Previous Inpatient Admission/Date/Reason:    Emotional Health/Current Symptoms  Suicide/Self Harm: None Reported Suicide Attempt in Past (date/description): Patient denies suicide attempt.  Other Harmful Behavior (ex. homicidal ideation) (describe): Patient denies homicidal ideations   Psychotic/Dissociative Symptoms  Psychotic/Dissociative Symptoms: None Reported Other Psychotic/Dissociative Symptoms:  8/18. Patient was having hallucinations from etoh withdrawal.  Attention/Behavioral Symptoms  Attention/Behavioral Symptoms: Withdrawn, Unable to Accurately Assess Other Attention/Behavioral Symptoms:  Patient sister reports patient does not engagein any activities. "He just sits home and drinks."   Cognitive Impairment  Cognitive Impairment:  Orientation - Self, Place, Situation Other Cognitive Impairment: Hx trauma to head.    Mood and Adjustment  Mood and Adjustment:  Confused,  Unstable/Inconsistent   Stress, Anxiety, Trauma, Any Recent Loss/Stressor  Stress, Anxiety, Trauma, Any Recent Loss/Stressor:   Anxiety (frequency): Patient reports having anxiety from not being able to engage with his family and children, "they won't have anything to do with me."  Phobia (specify):    Compulsive Behavior (specify):   Obsessive Behavior (specify):    Other Stress, Anxiety, Trauma, Any Recent Loss/Stressor:  Patient reports it is a stressor to have tremors, and not work. 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    Substance Abuse/Use  Substance Abuse/Use: Current Substance Use, Delirium Tremens (D.T.'s) SBIRT Completed (please refer for detailed history): N/A Self-reported Substance Use (last use and frequency):  Patient drinks several beers a day. He has been heavily drinking for the past 8 years.   Urinary Drug Screen Completed: Yes Alcohol Level: 394   Environment/Housing/Living Arrangement  Environmental/Housing/Living Arrangement: Stable Housing Who is in the Home: Self  Emergency Contact:  Sister in Sports coach. Ygnacio Fecteau [phone] Brother:  Gayland Nicol [phone]   Financial  Financial: Medicare   Patient's Strengths and Goals  Patient's Strengths and Goals (patient's own words):    Clinical Social Worker's Interpretive Summary  Clinical Social Workers Interpretive Summary:  LCSWA met with patient at bedside, patient alert able to verify his location.  Patient agreeable to assessment. Patient expressed  "I messed up" as the reason for this hospital admission.  Patient reports, I have been drinking and my family does not want anything to do with me." Patient report he has been drinking for eight years since his head injury. Patient reports no formal outpatient or inpatient substance abuse rehab treatment. Patient report he wants to go to inpatient rehab for substance abuse once medically stable.  LCSWA gathered information  from patient sister in law. She was unaware that patient was in the hospital. She report the patient has suffered from alcohol abuse for eight years, it started after his head injury and went on disability. She reports patient lives alone in his apartment and manages to pay his bills. She reports the first of the month he gets like this because he goes out and buys alcohol. She reports "he shakes when he does not had anything to drink." She reports patient seems to be depressed and down a lot. He has two daughter that live in [place] and does not have a relationship with them.  She reports he does not take medication for his depression.  She is hopeful the patient will go to rehab and get help. "He really needs help, I just hope he says yes to go."   Disposition  Disposition: Outpatient Referral Made/Needed

## 2016-05-19 NOTE — Plan of Care (Signed)
Problem: Nutrition: Goal: Adequate nutrition will be maintained Outcome: Not Progressing Pt not drinking/eating. Very sleepy. Will encourage fluids as patient becomes more awake.

## 2016-05-19 NOTE — Care Management Note (Signed)
Case Management Note  Patient Details  Name: Troy Torres MRN: [id number] Date of Birth: Feb 20, 1951  Subjective/Objective:    Transfer from SDU. Etoh, RLE cellulitis.iv abx,CIWA protocal. Psych-meds adjusted-otpt detox resources.               Action/Plan:d/c plan home.   Expected Discharge Date:                  Expected Discharge Plan:  Home/Self Care  In-House Referral:     Discharge planning Services     Post Acute Care Choice:    Choice offered to:     DME Arranged:    DME Agency:     HH Arranged:    HH Agency:     Status of Service:  In process, will continue to follow  If discussed at Long Length of Stay Meetings, dates discussed:    Additional Comments:  Lanier ClamMahabir, Konstantina Nachreiner, RN 05/19/2016, 3:55 PM

## 2016-05-19 NOTE — Progress Notes (Signed)
MD notified due to acute change in patient status. Vital signs and labs are listed below.  Time of 2nd Page: [id number] Responding MD:  Cena BentonVega Time MD responded: [email or web address] MD response: MD stated aware. Made aware of MEWS score 6 and VS. T- Rectal 101.7, BP 133/50, RR 28, HR 122. Patient appears to be resting comfortably, no signs of distress at this time. Will not respond verbally to questions. No new orders at this time.  Vital Signs Vitals:   05/19/16 0601 05/19/16 0656 05/19/16 0740 05/19/16 0748  BP: (!) 151/71  133/70   Pulse: (!) 112  (!) 122   Resp: 20  (!) 28   Temp: 100.2 F (37.9 C) (!) 101.7 F (38.7 C) (!) 102.3 F (39.1 C) (!) 101.7 F (38.7 C)  TempSrc: Oral Axillary Axillary Rectal  SpO2: 98%  100%   Weight:      Height:         Lab Results WBC  Date/Time Value Ref Range Status  05/17/2016 03:46 AM 6.3 4.0 - 10.5 K/uL Final  05/16/2016 03:16 AM 6.7 4.0 - 10.5 K/uL Final  05/12/2016 12:30 PM 9.3 4.0 - 10.5 K/uL Final   Neutrophils Relative %  Date/Time Value Ref Range Status  01/17/2016 09:26 AM 89 % Final  03/12/2015 02:56 AM 62 43 - 77 % Final  01/07/2013 10:27 AM 66 43 - 77 % Final   No results found for: PCO2ART Lactic Acid, Venous  Date/Time Value Ref Range Status  03/11/2015 03:28 AM 1.1 0.5 - 2.0 mmol/L Final  03/20/2012 09:30 PM 0.8 0.5 - 2.2 mmol/L Final   No results found for: PCO2VEN   Titus DubinKatelyn Walters Dayshia Ballinas, RN 05/19/2016, 8:00 AM

## 2016-05-19 NOTE — Progress Notes (Signed)
PROGRESS NOTE                                                                                                                                                                                                             Patient Demographics:    Troy PascalJames Krisko, is a 65 y.o. male, DOB - [birth year], ZOX:[id number]  Admit date - 05/12/2016   Admitting Physician Eddie NorthNishant Dhungel, MD  Outpatient Primary MD for the patient is No PCP Per Patient  LOS - 6  Outpatient Specialists:None  Chief Complaint  Patient presents with  . 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 His last drink was 2 days ago (drinks 4 ounces of water daily). Denied fever, chills, nausea, vomiting, chest, shortness of breath bile or urinary symptoms. He did complain of suprapubic pain and tenderness on exam. Patient found to be mildly encephalopathic with impaired judgment secondary to alcohol withdrawal (CIWA 11). Also had UTI. Labs showed mildly elevated lipase, low magnesium and low vitamin B12 level. Admitted to hospitalist service for further management. Patient went into active DTs and was to hospital ICU on Precedex drip.    Subjective:   Weaned off Precedex. Patient somnolent but when awaken provides no new complaints. Discussed with nursing and if patient more alert and cooperative may be transferred to floor.   Assessment  & Plan :    Principal Problem:   Alcohol withdrawal With  DTs (HCC) Continue monitoring in stepdown unit. Off precedex.  Continue patient on scheduled Ativan and CIWA monitoring.  - Psychiatry consulted and assisting. Pt on librium taper and seroquel  Sepsis - Could be  secondary to UTI vs cellulitis or both. Cellulitis at RLE at right foot.  Active Problems:   UTI (lower urinary tract infection) Staph species on culture, pansensitive. Empiric Rocephin.  Mild alcoholic pancreatitis No abdominal symptoms. Currently nothing by mouth.    GERD (gastroesophageal reflux disease)  PPI  Hypomagnesemia/ hypokalemia Replenished  Vitamin B12 deficiency Level 175.  started supplement.  Tobacco abuse  nicotine patch.  Code Status : Full code  Family Communication  :  None at bedside  Disposition Plan  : Home once acute symptoms resolved.  Barriers For Discharge : Active symptoms  Consults  :   PC CM  Procedures  :None  DVT Prophylaxis  :  Lovenox -  Lab Results  Component Value Date   PLT 212 05/19/2016    Antibiotics  :   Anti-infectives    Start     Dose/Rate Route Frequency Ordered Stop   05/19/16 1000  vancomycin (VANCOCIN) IVPB 750 mg/150 ml premix     750 mg 150 mL/hr over 60 Minutes Intravenous Every 12 hours 05/19/16 0834     05/18/16 1700  cefTRIAXone (ROCEPHIN) 1 g in dextrose 5 % 50 mL IVPB     1 g 100 mL/hr over 30 Minutes Intravenous Every 24 hours 05/18/16 1647     05/16/16 0930  ceFAZolin (ANCEF) IVPB 1 g/50 mL premix     1 g 100 mL/hr over 30 Minutes Intravenous Every 8 hours 05/16/16 0847 05/16/16 2328   05/13/16 1400  cefTRIAXone (ROCEPHIN) 1 g in dextrose 5 % 50 mL IVPB  Status:  Discontinued     1 g 100 mL/hr over 30 Minutes Intravenous Every 24 hours 05/12/16 1846 05/15/16 1041   05/12/16 1515  cefTRIAXone (ROCEPHIN) 1 g in dextrose 5 % 50 mL IVPB     1 g 100 mL/hr over 30 Minutes Intravenous  Once 05/12/16 1504 05/12/16 1549        Objective:   Vitals:   05/19/16 1045 05/19/16 1200 05/19/16 1405 05/19/16 1416  BP:   (!) 136/112 131/82  Pulse: (!) 104  (!) 104 98  Resp: (!) 22  20   Temp:  99.8 F (37.7 C) 98.4 F (36.9 C)   TempSrc:  Axillary Oral   SpO2:   97%   Weight:      Height:        Wt  Readings from Last 3 Encounters:  05/12/16 64.1 kg (141 lb 4.8 oz)  03/12/15 66.6 kg (146 lb 12.8 oz)  03/06/13 65.7 kg (144 lb 14.4 oz)     Intake/Output Summary (Last 24 hours) at 05/19/16 1432 Last data filed at 05/19/16 0700  Gross per 24 hour  Intake             1275 ml  Output              725 ml  Net              550 ml     Physical Exam  Gen: Still Somnolent but arousable, responsive to simple commands HEENT: MMM, supple neck Chest: clear b/l, no added sounds CVS: N S1&S2, no murmurs, rubs or gallop GI: soft, NT, ND, BS+ Musculoskeletal: warm, no edema CNS: no facial asymmetry, moves all extremities    Data Review:    CBC  Recent Labs Lab 05/16/16 0316 05/17/16 0346 05/19/16 0901  WBC 6.7 6.3 7.5  HGB 11.4* 10.7* 11.4*  HCT 34.4* 31.9* 32.9*  PLT 137* 131* 212  MCV 86.9 85.1 85.7  MCH 28.8 28.5 29.7  MCHC 33.1 33.5 34.7  RDW 14.6 14.7 14.6  LYMPHSABS  --   --  0.9  MONOABS  --   --  2.0*  EOSABS  --   --  0.0  BASOSABS  --   --  0.0    Chemistries   Recent Labs Lab 05/12/16 2004  05/14/16 0815 05/15/16 0337 05/16/16 0316 05/17/16 0346 05/19/16 0901  NA  --   < >  133* 135 134* 134* 136  K  --   < > 3.5 3.9 3.7 3.9 3.9  CL  --   < > 101 105 105 105 102  CO2  --   < > 24 23 18* 22 25  GLUCOSE  --   < > 108* 98 70 161* 118*  BUN  --   < > 16 11 14 7 11   CREATININE  --   < > 0.77 0.73 0.85 0.93 0.92  CALCIUM  --   < > 8.9 8.2* 8.3* 8.8* 9.3  MG 1.2*  --  1.6* 1.4* 1.6*  --   --   AST 34  --   --  30  --   --  15  ALT 16*  --   --  16*  --   --  11*  ALKPHOS 67  --   --  57  --   --  46  BILITOT 1.0  --   --  0.7  --   --  0.9  < > = values in this interval not displayed. ------------------------------------------------------------------------------------------------------------------ No results for input(s): CHOL, HDL, LDLCALC, TRIG, CHOLHDL, LDLDIRECT in the last 72 hours.  Lab Results  Component Value Date   HGBA1C 5.4 09/26/2011    ------------------------------------------------------------------------------------------------------------------ No results for input(s): TSH, T4TOTAL, T3FREE, THYROIDAB in the last 72 hours.  Invalid input(s): FREET3 ------------------------------------------------------------------------------------------------------------------ No results for input(s): VITAMINB12, FOLATE, FERRITIN, TIBC, IRON, RETICCTPCT in the last 72 hours.  Coagulation profile  Recent Labs Lab 05/19/16 0901  INR 1.05    No results for input(s): DDIMER in the last 72 hours.  Cardiac Enzymes No results for input(s): CKMB, TROPONINI, MYOGLOBIN in the last 168 hours.  Invalid input(s): CK ------------------------------------------------------------------------------------------------------------------ No results found for: BNP  Inpatient Medications  Scheduled Meds: . cefTRIAXone (ROCEPHIN)  IV  1 g Intravenous Q24H  . chlordiazePOXIDE  25 mg Oral QID   Followed by  . [START ON 05/20/2016] chlordiazePOXIDE  25 mg Oral TID   Followed by  . [START ON 05/21/2016] chlordiazePOXIDE  25 mg Oral BH-qamhs   Followed by  . [START ON 05/22/2016] chlordiazePOXIDE  25 mg Oral Daily  . famotidine (PEPCID) IV  20 mg Intravenous Q12H  . folic acid  1 mg Intravenous Daily  . heparin  5,000 Units Subcutaneous Q8H  . LORazepam  0.5 mg Intravenous BID  . multivitamin with minerals  1 tablet Oral Daily  . nicotine  14 mg Transdermal Daily  . QUEtiapine  50 mg Oral BID  . thiamine  100 mg Oral Daily  . vancomycin  750 mg Intravenous Q12H  . vitamin B-12  1,000 mcg Oral Daily   Continuous Infusions: . dextrose 5 % and 0.9% NaCl 50 mL/hr at 05/19/16 0226   PRN Meds:.acetaminophen **OR** acetaminophen, chlordiazePOXIDE, hydrALAZINE, hydrOXYzine, ipratropium-albuterol, loperamide, LORazepam, ondansetron  Micro Results Recent Results (from the past 240 hour(s))  Urine culture     Status: Abnormal   Collection Time:  05/12/16 12:51 PM  Result Value Ref Range Status   Specimen Description URINE, RANDOM  Final   Special Requests NONE  Final   Culture (A)  Final    >=100,000 COLONIES/mL STAPHYLOCOCCUS SPECIES (COAGULASE NEGATIVE)   Report Status 05/14/2016 FINAL  Final   Organism ID, Bacteria STAPHYLOCOCCUS SPECIES (COAGULASE NEGATIVE) (A)  Final      Susceptibility   Staphylococcus species (coagulase negative) - MIC*    CIPROFLOXACIN <=0.5 SENSITIVE Sensitive     GENTAMICIN <=0.5 SENSITIVE Sensitive  NITROFURANTOIN <=16 SENSITIVE Sensitive     OXACILLIN <=0.25 SENSITIVE Sensitive     TETRACYCLINE 2 SENSITIVE Sensitive     VANCOMYCIN 1 SENSITIVE Sensitive     TRIMETH/SULFA <=10 SENSITIVE Sensitive     CLINDAMYCIN <=0.25 SENSITIVE Sensitive     RIFAMPIN <=0.5 SENSITIVE Sensitive     Inducible Clindamycin NEGATIVE Sensitive     * >=100,000 COLONIES/mL STAPHYLOCOCCUS SPECIES (COAGULASE NEGATIVE)  MRSA PCR Screening     Status: None   Collection Time: 05/14/16  5:45 PM  Result Value Ref Range Status   MRSA by PCR NEGATIVE NEGATIVE Final    Comment:        The GeneXpert MRSA Assay (FDA approved for NASAL specimens only), is one component of a comprehensive MRSA colonization surveillance program. It is not intended to diagnose MRSA infection nor to guide or monitor treatment for MRSA infections.     Radiology Reports Dg Chest 2 View  Result Date: 05/10/2016 CLINICAL DATA:  Chest pain. EXAM: CHEST  2 VIEW COMPARISON:  04/17/2016 FINDINGS: The heart size and mediastinal contours are within normal limits. There is no evidence of pulmonary edema, consolidation, pneumothorax, nodule or pleural fluid. The visualized skeletal structures are unremarkable. IMPRESSION: No active cardiopulmonary disease. Electronically Signed   By: Irish Lack M.D.   On: 05/10/2016 11:38   Dg Chest Port 1 View  Result Date: 05/19/2016 CLINICAL DATA:  Sepsis.  Productive cough and fever. EXAM: PORTABLE CHEST 1 VIEW  COMPARISON:  05/15/2016 FINDINGS: Both lungs are clear. Negative for airspace disease or pulmonary edema. Heart and mediastinum are within normal limits. No large pleural effusions. Negative for a pneumothorax. No acute bone abnormality IMPRESSION: No acute chest findings. Electronically Signed   By: Richarda Overlie M.D.   On: 05/19/2016 08:34   Dg Chest Port 1 View  Result Date: 05/15/2016 CLINICAL DATA:  Shortness of breath.  Altered mental status. EXAM: PORTABLE CHEST 1 VIEW COMPARISON:  05/10/2016. FINDINGS: Normal sized heart.  Clear lungs.  Unremarkable bones. IMPRESSION: No acute abnormality. Electronically Signed   By: Beckie Salts M.D.   On: 05/15/2016 12:48    Time Spent in minutes 35   Penny Pia M.D on 05/19/2016 at 2:32 PM  Between 7am to 7pm - Pager - [phone]  After 7pm go to [email or web address] - password [facility]  [facility] -  Office  [phone]

## 2016-05-19 NOTE — Progress Notes (Signed)
Patient still has not urinated this shift. MD Cena BentonVega paged and made aware. Bladder scan continues to show 333 ml and condom cath is intact. Patient unable to urinate at this time. No signs of discomfort. Per MD, continue IVF and do not I/O cath at this time.

## 2016-05-20 LAB — CBC
HEMATOCRIT: 31.8 % — AB (ref 39.0–52.0)
Hemoglobin: 10.9 g/dL — ABNORMAL LOW (ref 13.0–17.0)
MCH: 29.4 pg (ref 26.0–34.0)
MCHC: 34.3 g/dL (ref 30.0–36.0)
MCV: 85.7 fL (ref 78.0–100.0)
PLATELETS: 219 10*3/uL (ref 150–400)
RBC: 3.71 MIL/uL — ABNORMAL LOW (ref 4.22–5.81)
RDW: 14.5 % (ref 11.5–15.5)
WBC: 5.5 10*3/uL (ref 4.0–10.5)

## 2016-05-20 NOTE — Progress Notes (Signed)
Assumed care of patient, agree with previous RN assessment.  Garcia Dalzell M. Gerrica Cygan, RN  

## 2016-05-20 NOTE — Progress Notes (Signed)
PROGRESS NOTE                                                                                                                                                                                                             Patient Demographics:    Troy Torres, is a 65 y.o. male, DOB - [birth year], ZOX:[id number]  Admit date - 05/12/2016   Admitting Physician Eddie North, MD  Outpatient Primary MD for the patient is No PCP Per Patient  LOS - 7  Outpatient Specialists:None  Chief Complaint  Patient presents with  . 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 His last drink was 2 days ago (drinks 4 ounces of water daily). Denied fever, chills, nausea, vomiting, chest, shortness of breath bile or urinary symptoms. He did complain of suprapubic pain and tenderness on exam. Patient found to be mildly encephalopathic with impaired judgment secondary to alcohol withdrawal (CIWA 11). Also had UTI. Labs showed mildly elevated lipase, low magnesium and low vitamin B12 level. Admitted to hospitalist service for further management. Patient went into active DTs and was to hospital ICU on Precedex drip.    Subjective:   Pt still somnolent. No acute issues reported to me by patient.   Assessment  & Plan :    Principal Problem:   Alcohol withdrawal With  DTs (HCC) Continue monitoring in stepdown unit. Off precedex.  Continue patient on scheduled Ativan and CIWA monitoring.  - Psychiatry consulted and assisting. Pt on librium taper and seroquel will continue  Sepsis - Could be secondary to UTI vs cellulitis or both. Cellulitis at RLE at right foot.  Active Problems:   UTI (lower  urinary tract infection) Staph species on culture, pansensitive. Empiric Rocephin.  Mild alcoholic pancreatitis No abdominal symptoms. Currently nothing by mouth.    GERD (gastroesophageal reflux disease)  PPI  Hypomagnesemia/ hypokalemia Replenished  Vitamin B12 deficiency Level 175.  started supplement.  Tobacco abuse  nicotine patch.  Code Status : Full code  Family Communication  : None at bedside  Disposition Plan  : Home once acute symptoms resolved.  Barriers For Discharge : Active symptoms  Consults  :   PC CM  Procedures  :None  DVT Prophylaxis  :  Lovenox -  Lab Results  Component Value Date   PLT 219 05/20/2016    Antibiotics  :   Anti-infectives    Start     Dose/Rate Route Frequency Ordered Stop   05/19/16 1000  vancomycin (VANCOCIN) IVPB 750 mg/150 ml premix     750 mg 150 mL/hr over 60 Minutes Intravenous Every 12 hours 05/19/16 0834     05/18/16 1700  cefTRIAXone (ROCEPHIN) 1 g in dextrose 5 % 50 mL IVPB     1 g 100 mL/hr over 30 Minutes Intravenous Every 24 hours 05/18/16 1647     05/16/16 0930  ceFAZolin (ANCEF) IVPB 1 g/50 mL premix     1 g 100 mL/hr over 30 Minutes Intravenous Every 8 hours 05/16/16 0847 05/16/16 2328   05/13/16 1400  cefTRIAXone (ROCEPHIN) 1 g in dextrose 5 % 50 mL IVPB  Status:  Discontinued     1 g 100 mL/hr over 30 Minutes Intravenous Every 24 hours 05/12/16 1846 05/15/16 1041   05/12/16 1515  cefTRIAXone (ROCEPHIN) 1 g in dextrose 5 % 50 mL IVPB     1 g 100 mL/hr over 30 Minutes Intravenous  Once 05/12/16 1504 05/12/16 1549        Objective:   Vitals:   05/19/16 1405 05/19/16 1416 05/19/16 2212 05/20/16 0533  BP: (!) 136/112 131/82 (!) 154/88 (!) 147/97  Pulse: (!) 104 98 96 96  Resp: 20  (!) 25   Temp: 98.4 F (36.9 C)  98.7 F (37.1 C) 99 F (37.2 C)  TempSrc: Oral  Oral Oral  SpO2: 97%  98% 97%  Weight:      Height:        Wt Readings from Last 3 Encounters:  05/12/16 64.1 kg (141 lb 4.8 oz)    03/12/15 66.6 kg (146 lb 12.8 oz)  03/06/13 65.7 kg (144 lb 14.4 oz)     Intake/Output Summary (Last 24 hours) at 05/20/16 1019 Last data filed at 05/20/16 0010  Gross per 24 hour  Intake             1870 ml  Output              210 ml  Net             1660 ml     Physical Exam  Gen:  Somnolent but arousable, responsive to simple commands HEENT: MMM, supple neck Chest: clear b/l, no added sounds CVS: N S1&S2, no murmurs, rubs or gallop GI: soft, NT, ND, BS+ Musculoskeletal: warm, no edema CNS: no facial asymmetry, moves all extremities    Data Review:    CBC  Recent Labs Lab 05/16/16 0316 05/17/16 0346 05/19/16 0901 05/20/16 0605  WBC 6.7 6.3 7.5 5.5  HGB 11.4* 10.7* 11.4* 10.9*  HCT 34.4* 31.9* 32.9* 31.8*  PLT 137* 131* 212 219  MCV 86.9 85.1 85.7 85.7  MCH 28.8 28.5 29.7 29.4  MCHC 33.1 33.5 34.7 34.3  RDW 14.6 14.7 14.6 14.5  LYMPHSABS  --   --  0.9  --   MONOABS  --   --  2.0*  --   EOSABS  --   --  0.0  --   BASOSABS  --   --  0.0  --     Chemistries   Recent Labs Lab 05/14/16 0815 05/15/16 [id number]  05/16/16 0316 05/17/16 0346 05/19/16 0901  NA 133* 135 134* 134* 136  K 3.5 3.9 3.7 3.9 3.9  CL 101 105 105 105 102  CO2 24 23 18* 22 25  GLUCOSE 108* 98 70 161* 118*  BUN 16 11 14 7 11   CREATININE 0.77 0.73 0.85 0.93 0.92  CALCIUM 8.9 8.2* 8.3* 8.8* 9.3  MG 1.6* 1.4* 1.6*  --   --   AST  --  30  --   --  15  ALT  --  16*  --   --  11*  ALKPHOS  --  57  --   --  46  BILITOT  --  0.7  --   --  0.9   ------------------------------------------------------------------------------------------------------------------ No results for input(s): CHOL, HDL, LDLCALC, TRIG, CHOLHDL, LDLDIRECT in the last 72 hours.  Lab Results  Component Value Date   HGBA1C 5.4 09/26/2011   ------------------------------------------------------------------------------------------------------------------ No results for input(s): TSH, T4TOTAL, T3FREE, THYROIDAB in the  last 72 hours.  Invalid input(s): FREET3 ------------------------------------------------------------------------------------------------------------------ No results for input(s): VITAMINB12, FOLATE, FERRITIN, TIBC, IRON, RETICCTPCT in the last 72 hours.  Coagulation profile  Recent Labs Lab 05/19/16 0901  INR 1.05    No results for input(s): DDIMER in the last 72 hours.  Cardiac Enzymes No results for input(s): CKMB, TROPONINI, MYOGLOBIN in the last 168 hours.  Invalid input(s): CK ------------------------------------------------------------------------------------------------------------------ No results found for: BNP  Inpatient Medications  Scheduled Meds: . cefTRIAXone (ROCEPHIN)  IV  1 g Intravenous Q24H  . chlordiazePOXIDE  25 mg Oral TID   Followed by  . [START ON 05/21/2016] chlordiazePOXIDE  25 mg Oral BH-qamhs   Followed by  . [START ON 05/22/2016] chlordiazePOXIDE  25 mg Oral Daily  . famotidine (PEPCID) IV  20 mg Intravenous Q12H  . folic acid  1 mg Intravenous Daily  . heparin  5,000 Units Subcutaneous Q8H  . LORazepam  0.5 mg Intravenous BID  . multivitamin with minerals  1 tablet Oral Daily  . nicotine  14 mg Transdermal Daily  . QUEtiapine  50 mg Oral BID  . thiamine  100 mg Oral Daily  . vancomycin  750 mg Intravenous Q12H  . vitamin B-12  1,000 mcg Oral Daily   Continuous Infusions: . dextrose 5 % and 0.9% NaCl 100 mL/hr at 05/20/16 0508   PRN Meds:.acetaminophen **OR** acetaminophen, chlordiazePOXIDE, hydrALAZINE, hydrOXYzine, ipratropium-albuterol, loperamide, LORazepam, ondansetron  Micro Results Recent Results (from the past 240 hour(s))  Urine culture     Status: Abnormal   Collection Time: 05/12/16 12:51 PM  Result Value Ref Range Status   Specimen Description URINE, RANDOM  Final   Special Requests NONE  Final   Culture (A)  Final    >=100,000 COLONIES/mL STAPHYLOCOCCUS SPECIES (COAGULASE NEGATIVE)   Report Status 05/14/2016 FINAL   Final   Organism ID, Bacteria STAPHYLOCOCCUS SPECIES (COAGULASE NEGATIVE) (A)  Final      Susceptibility   Staphylococcus species (coagulase negative) - MIC*    CIPROFLOXACIN <=0.5 SENSITIVE Sensitive     GENTAMICIN <=0.5 SENSITIVE Sensitive     NITROFURANTOIN <=16 SENSITIVE Sensitive     OXACILLIN <=0.25 SENSITIVE Sensitive     TETRACYCLINE 2 SENSITIVE Sensitive     VANCOMYCIN 1 SENSITIVE Sensitive     TRIMETH/SULFA <=10 SENSITIVE Sensitive     CLINDAMYCIN <=0.25 SENSITIVE Sensitive     RIFAMPIN <=0.5 SENSITIVE Sensitive     Inducible Clindamycin NEGATIVE Sensitive     * >=100,000 COLONIES/mL STAPHYLOCOCCUS SPECIES (COAGULASE NEGATIVE)  MRSA  PCR Screening     Status: None   Collection Time: 05/14/16  5:45 PM  Result Value Ref Range Status   MRSA by PCR NEGATIVE NEGATIVE Final    Comment:        The GeneXpert MRSA Assay (FDA approved for NASAL specimens only), is one component of a comprehensive MRSA colonization surveillance program. It is not intended to diagnose MRSA infection nor to guide or monitor treatment for MRSA infections.     Radiology Reports Dg Chest 2 View  Result Date: 05/10/2016 CLINICAL DATA:  Chest pain. EXAM: CHEST  2 VIEW COMPARISON:  04/17/2016 FINDINGS: The heart size and mediastinal contours are within normal limits. There is no evidence of pulmonary edema, consolidation, pneumothorax, nodule or pleural fluid. The visualized skeletal structures are unremarkable. IMPRESSION: No active cardiopulmonary disease. Electronically Signed   By: Irish Lack M.D.   On: 05/10/2016 11:38   Dg Chest Port 1 View  Result Date: 05/19/2016 CLINICAL DATA:  Sepsis.  Productive cough and fever. EXAM: PORTABLE CHEST 1 VIEW COMPARISON:  05/15/2016 FINDINGS: Both lungs are clear. Negative for airspace disease or pulmonary edema. Heart and mediastinum are within normal limits. No large pleural effusions. Negative for a pneumothorax. No acute bone abnormality IMPRESSION: No  acute chest findings. Electronically Signed   By: Richarda Overlie M.D.   On: 05/19/2016 08:34   Dg Chest Port 1 View  Result Date: 05/15/2016 CLINICAL DATA:  Shortness of breath.  Altered mental status. EXAM: PORTABLE CHEST 1 VIEW COMPARISON:  05/10/2016. FINDINGS: Normal sized heart.  Clear lungs.  Unremarkable bones. IMPRESSION: No acute abnormality. Electronically Signed   By: Beckie Salts M.D.   On: 05/15/2016 12:48    Time Spent in minutes 35   Penny Pia M.D on 05/20/2016 at 10:19 AM  Between 7am to 7pm - Pager - [phone]  After 7pm go to [email or web address] - password [facility]  [facility] -  Office  [phone]

## 2016-05-21 MED ORDER — ENSURE ENLIVE PO LIQD
237.0000 mL | Freq: Two times a day (BID) | ORAL | Status: DC
  Administered 2016-05-22 – 2016-05-24 (×5): 237 mL via ORAL

## 2016-05-21 NOTE — Progress Notes (Signed)
Initial Nutrition Assessment  DOCUMENTATION CODES:   Severe malnutrition in context of chronic illness  INTERVENTION:   Provide Ensure Enlive po BID, each supplement provides 350 kcal and 20 grams of protein Encourage PO intake RD to continue to monitor  NUTRITION DIAGNOSIS:   Malnutrition related to chronic illness as evidenced by energy intake < or equal to 75% for > or equal to 1 month, severe depletion of muscle mass.  GOAL:   Patient will meet greater than or equal to 90% of their needs  MONITOR:   PO intake, Supplement acceptance, Labs, Weight trends, I & O's  REASON FOR ASSESSMENT:   Malnutrition Screening Tool    ASSESSMENT:   65 year old male with alcohol abuse with history of DTs and seizures in the past, GERD, ongoing tobacco use presented with left upper quadrant crampy abdominal pain radiating to mid abdomen without aggravating or relieving factors and no associated symptoms. His last drink was 2 days ago (drinks 4 ounces of water daily). Denied fever, chills, nausea, vomiting, chest, shortness of breath bile or urinary symptoms. He did complain of suprapubic pain and tenderness on exam.  Patient in room with mitts on hands. Pt with some confusion. Pt asked RD "What are you doing working on Christmas?". RD informed pt that it was not Christmas, he stated "It's christmas Eve". Lunch tray is at bedside, untouched. Pt states he did not eat anything today. PO intake documentation shows 25% meal completion on 8/18. Pt unable to give further information on intake.  RD to order Ensure supplements. Pt concerned about cost of supplement. Explained that protein supplements would be beneficial given his poor PO intake.  Per weight history, weight has remained stable.  Nutrition-Focused physical exam completed. Findings are no fat depletion, severe muscle depletion, and no edema.   Labs reviewed. Medications: IV Folic acid daily, Multivitamin with minerals daily, Thiamine  tablet daily, Vitamin B12 tablet daily, D5 -.9% NaCl infusion at 100 ml/hr-provides 408 kcal  Diet Order:  Diet Heart Room service appropriate? Yes; Fluid consistency: Thin  Skin:  Reviewed, no issues  Last BM:  8/18  Height:   Ht Readings from Last 1 Encounters:  05/12/16 5\' 7"  (1.702 m)    Weight:   Wt Readings from Last 1 Encounters:  05/12/16 141 lb 4.8 oz (64.1 kg)    Ideal Body Weight:  67.3 kg  BMI:  Body mass index is 22.13 kg/m.  Estimated Nutritional Needs:   Kcal:  1950-2150  Protein:  95-105g  Fluid:  2.1L/day  EDUCATION NEEDS:   No education needs identified at this time  Tilda FrancoLindsey Lasaundra Riche, MS, RD, LDN Pager: [phone] After Hours Pager: [phone]

## 2016-05-21 NOTE — Progress Notes (Signed)
PROGRESS NOTE                                                                                                                                                                                                             Patient Demographics:    Troy Torres, is a 65 y.o. male, DOB - 1950/12/08, ZOX:[id number]  Admit date - 05/12/2016   Admitting Physician Eddie North, MD  Outpatient Primary MD for the patient is No PCP Per Patient  LOS - 8  Outpatient Specialists:None  Chief Complaint  Patient presents with  . 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 His last drink was 2 days ago (drinks 4 ounces of water daily). Denied fever, chills, nausea, vomiting, chest, shortness of breath bile or urinary symptoms. He did complain of suprapubic pain and tenderness on exam. Patient found to be mildly encephalopathic with impaired judgment secondary to alcohol withdrawal (CIWA 11). Also had UTI. Labs showed mildly elevated lipase, low magnesium and low vitamin B12 level. Admitted to hospitalist service for further management. Patient went into active DTs and was to hospital ICU on Precedex drip.    Subjective:   Pt is more alert today.   Assessment  & Plan :    Principal Problem:   Alcohol withdrawal With  DTs (HCC) Continue monitoring in stepdown unit. Off precedex.  Continue patient on scheduled Ativan and CIWA monitoring.  - Psychiatry consulted and assisting. Pt on librium taper and seroquel will continue  Sepsis - Could be secondary to UTI vs cellulitis or both. Cellulitis at RLE at right foot.  Active Problems:   UTI (lower urinary tract infection) Staph species  on culture, pansensitive. Empiric Rocephin.  Mild alcoholic pancreatitis No abdominal symptoms. Currently nothing by mouth.    GERD (gastroesophageal reflux disease)  PPI  Hypomagnesemia/ hypokalemia Replenished  Vitamin B12 deficiency Level 175.  started supplement.  Tobacco abuse  nicotine patch.  Code Status : Full code  Family Communication  : None at bedside  Disposition Plan  : Home once acute symptoms resolved.  Barriers For Discharge : Active symptoms  Consults  :   PC CM  Procedures  :None  DVT Prophylaxis  :  Lovenox -  Lab Results  Component Value Date   PLT 219 05/20/2016    Antibiotics  :   Anti-infectives    Start     Dose/Rate Route Frequency Ordered Stop   05/19/16 1000  vancomycin (VANCOCIN) IVPB 750 mg/150 ml premix     750 mg 150 mL/hr over 60 Minutes Intravenous Every 12 hours 05/19/16 0834     05/18/16 1700  cefTRIAXone (ROCEPHIN) 1 g in dextrose 5 % 50 mL IVPB     1 g 100 mL/hr over 30 Minutes Intravenous Every 24 hours 05/18/16 1647     05/16/16 0930  ceFAZolin (ANCEF) IVPB 1 g/50 mL premix     1 g 100 mL/hr over 30 Minutes Intravenous Every 8 hours 05/16/16 0847 05/16/16 2328   05/13/16 1400  cefTRIAXone (ROCEPHIN) 1 g in dextrose 5 % 50 mL IVPB  Status:  Discontinued     1 g 100 mL/hr over 30 Minutes Intravenous Every 24 hours 05/12/16 1846 05/15/16 1041   05/12/16 1515  cefTRIAXone (ROCEPHIN) 1 g in dextrose 5 % 50 mL IVPB     1 g 100 mL/hr over 30 Minutes Intravenous  Once 05/12/16 1504 05/12/16 1549        Objective:   Vitals:   05/20/16 0533 05/20/16 1500 05/20/16 2026 05/21/16 0422  BP: (!) 147/97 (!) 162/104 (!) 156/86 (!) 169/103  Pulse: 96 94 90 88  Resp:  (!) 22  20  Temp: 99 F (37.2 C) 97.6 F (36.4 C) 99 F (37.2 C) 98.5 F (36.9 C)  TempSrc: Oral Oral Oral Oral  SpO2: 97% 98%  100%  Weight:      Height:        Wt Readings from Last 3 Encounters:  05/12/16 64.1 kg (141 lb 4.8 oz)  03/12/15 66.6 kg (146  lb 12.8 oz)  03/06/13 65.7 kg (144 lb 14.4 oz)     Intake/Output Summary (Last 24 hours) at 05/21/16 1337 Last data filed at 05/21/16 1101  Gross per 24 hour  Intake          2233.33 ml  Output             1350 ml  Net           883.33 ml     Physical Exam  Gen:  Somnolent but arousable, responsive to simple commands, in nad. HEENT: MMM, supple neck Chest: clear b/l, no added sounds CVS: N S1&S2, no murmurs, rubs or gallop GI: soft, NT, ND, BS+ Musculoskeletal: warm, no edema CNS: no facial asymmetry, moves all extremities    Data Review:    CBC  Recent Labs Lab 05/16/16 0316 05/17/16 0346 05/19/16 0901 05/20/16 0605  WBC 6.7 6.3 7.5 5.5  HGB 11.4* 10.7* 11.4* 10.9*  HCT 34.4* 31.9* 32.9* 31.8*  PLT 137* 131* 212 219  MCV 86.9 85.1 85.7 85.7  MCH 28.8 28.5 29.7 29.4  MCHC 33.1 33.5 34.7 34.3  RDW 14.6 14.7 14.6 14.5  LYMPHSABS  --   --  0.9  --   MONOABS  --   --  2.0*  --   EOSABS  --   --  0.0  --   BASOSABS  --   --  0.0  --     Chemistries   Recent Labs Lab 05/15/16 0337 05/16/16 0316 05/17/16 0346 05/19/16 0901  NA 135 134* 134*  136  K 3.9 3.7 3.9 3.9  CL 105 105 105 102  CO2 23 18* 22 25  GLUCOSE 98 70 161* 118*  BUN 11 14 7 11   CREATININE 0.73 0.85 0.93 0.92  CALCIUM 8.2* 8.3* 8.8* 9.3  MG 1.4* 1.6*  --   --   AST 30  --   --  15  ALT 16*  --   --  11*  ALKPHOS 57  --   --  46  BILITOT 0.7  --   --  0.9   ------------------------------------------------------------------------------------------------------------------ No results for input(s): CHOL, HDL, LDLCALC, TRIG, CHOLHDL, LDLDIRECT in the last 72 hours.  Lab Results  Component Value Date   HGBA1C 5.4 09/26/2011   ------------------------------------------------------------------------------------------------------------------ No results for input(s): TSH, T4TOTAL, T3FREE, THYROIDAB in the last 72 hours.  Invalid input(s):  FREET3 ------------------------------------------------------------------------------------------------------------------ No results for input(s): VITAMINB12, FOLATE, FERRITIN, TIBC, IRON, RETICCTPCT in the last 72 hours.  Coagulation profile  Recent Labs Lab 05/19/16 0901  INR 1.05    No results for input(s): DDIMER in the last 72 hours.  Cardiac Enzymes No results for input(s): CKMB, TROPONINI, MYOGLOBIN in the last 168 hours.  Invalid input(s): CK ------------------------------------------------------------------------------------------------------------------ No results found for: BNP  Inpatient Medications  Scheduled Meds: . cefTRIAXone (ROCEPHIN)  IV  1 g Intravenous Q24H  . famotidine (PEPCID) IV  20 mg Intravenous Q12H  . folic acid  1 mg Intravenous Daily  . heparin  5,000 Units Subcutaneous Q8H  . LORazepam  0.5 mg Intravenous BID  . multivitamin with minerals  1 tablet Oral Daily  . nicotine  14 mg Transdermal Daily  . QUEtiapine  50 mg Oral BID  . thiamine  100 mg Oral Daily  . vancomycin  750 mg Intravenous Q12H  . vitamin B-12  1,000 mcg Oral Daily   Continuous Infusions: . dextrose 5 % and 0.9% NaCl 100 mL/hr at 05/21/16 0416   PRN Meds:.acetaminophen **OR** acetaminophen, chlordiazePOXIDE, hydrALAZINE, hydrOXYzine, ipratropium-albuterol, loperamide, LORazepam, ondansetron  Micro Results Recent Results (from the past 240 hour(s))  Urine culture     Status: Abnormal   Collection Time: 05/12/16 12:51 PM  Result Value Ref Range Status   Specimen Description URINE, RANDOM  Final   Special Requests NONE  Final   Culture (A)  Final    >=100,000 COLONIES/mL STAPHYLOCOCCUS SPECIES (COAGULASE NEGATIVE)   Report Status 05/14/2016 FINAL  Final   Organism ID, Bacteria STAPHYLOCOCCUS SPECIES (COAGULASE NEGATIVE) (A)  Final      Susceptibility   Staphylococcus species (coagulase negative) - MIC*    CIPROFLOXACIN <=0.5 SENSITIVE Sensitive     GENTAMICIN <=0.5  SENSITIVE Sensitive     NITROFURANTOIN <=16 SENSITIVE Sensitive     OXACILLIN <=0.25 SENSITIVE Sensitive     TETRACYCLINE 2 SENSITIVE Sensitive     VANCOMYCIN 1 SENSITIVE Sensitive     TRIMETH/SULFA <=10 SENSITIVE Sensitive     CLINDAMYCIN <=0.25 SENSITIVE Sensitive     RIFAMPIN <=0.5 SENSITIVE Sensitive     Inducible Clindamycin NEGATIVE Sensitive     * >=100,000 COLONIES/mL STAPHYLOCOCCUS SPECIES (COAGULASE NEGATIVE)  MRSA PCR Screening     Status: None   Collection Time: 05/14/16  5:45 PM  Result Value Ref Range Status   MRSA by PCR NEGATIVE NEGATIVE Final    Comment:        The GeneXpert MRSA Assay (FDA approved for NASAL specimens only), is one component of a comprehensive MRSA colonization surveillance program. It is not intended to diagnose MRSA infection nor  to guide or monitor treatment for MRSA infections.   Culture, blood (x 2)     Status: None (Preliminary result)   Collection Time: 05/19/16  9:01 AM  Result Value Ref Range Status   Specimen Description BLOOD RIGHT HAND  Final   Special Requests BOTTLES DRAWN AEROBIC AND ANAEROBIC 10CC  Final   Culture   Final    NO GROWTH 1 DAY Performed at [facility]    Report Status PENDING  Incomplete  Culture, blood (x 2)     Status: None (Preliminary result)   Collection Time: 05/19/16  9:01 AM  Result Value Ref Range Status   Specimen Description BLOOD RIGHT ANTECUBITAL  Final   Special Requests BOTTLES DRAWN AEROBIC AND ANAEROBIC 5CC  Final   Culture   Final    NO GROWTH 1 DAY Performed at [facility]    Report Status PENDING  Incomplete    Radiology Reports Dg Chest 2 View  Result Date: 05/10/2016 CLINICAL DATA:  Chest pain. EXAM: CHEST  2 VIEW COMPARISON:  04/17/2016 FINDINGS: The heart size and mediastinal contours are within normal limits. There is no evidence of pulmonary edema, consolidation, pneumothorax, nodule or pleural fluid. The visualized skeletal structures are unremarkable.  IMPRESSION: No active cardiopulmonary disease. Electronically Signed   By: Irish Lack M.D.   On: 05/10/2016 11:38   Dg Chest Port 1 View  Result Date: 05/19/2016 CLINICAL DATA:  Sepsis.  Productive cough and fever. EXAM: PORTABLE CHEST 1 VIEW COMPARISON:  05/15/2016 FINDINGS: Both lungs are clear. Negative for airspace disease or pulmonary edema. Heart and mediastinum are within normal limits. No large pleural effusions. Negative for a pneumothorax. No acute bone abnormality IMPRESSION: No acute chest findings. Electronically Signed   By: Richarda Overlie M.D.   On: 05/19/2016 08:34   Dg Chest Port 1 View  Result Date: 05/15/2016 CLINICAL DATA:  Shortness of breath.  Altered mental status. EXAM: PORTABLE CHEST 1 VIEW COMPARISON:  05/10/2016. FINDINGS: Normal sized heart.  Clear lungs.  Unremarkable bones. IMPRESSION: No acute abnormality. Electronically Signed   By: Beckie Salts M.D.   On: 05/15/2016 12:48    Time Spent in minutes 35   Penny Pia M.D on 05/21/2016 at 1:37 PM  Between 7am to 7pm - Pager - [phone]  After 7pm go to [email or web address] - password [facility]  [facility] -  Office  [phone]

## 2016-05-21 NOTE — Progress Notes (Signed)
Pharmacy Antibiotic Note  Troy Torres is a 65 y.o. male admitted on 05/12/2016 with alcohol withdrawal. He was treated with ceftriaxone/cefazolin for pansensitive CNS UTI.  On 8/17, he was noted today to have cellulitis of his right foot and Pharmacy was been consulted for ceftriaxone dosing. Pharmacy was consulted 8/18 for vancomycin for developing sepsis.   Plan:  Spoke with Dr. Cena BentonVega, ok to stop vancomycin today as patient is clinically improving and coag neg staph in urine should have been adequately treated.  Continue ceftriaxone 1g IV q24h for LE cellulitis  When appropriate for PO, consider transition to keflex or doxycycline  No further dosing adjustments needed so pharmacy will sign-off  Height: 5\' 7"  (170.2 cm) Weight: 141 lb 4.8 oz (64.1 kg) IBW/kg (Calculated) : 66.1  Temp (24hrs), Avg:98.3 F (36.8 C), Min:97.6 F (36.4 C), Max:99 F (37.2 C)   Recent Labs Lab 05/15/16 0337 05/16/16 0316 05/17/16 0346 05/19/16 0901 05/19/16 1144 05/20/16 0605  WBC  --  6.7 6.3 7.5  --  5.5  CREATININE 0.73 0.85 0.93 0.92  --   --   LATICACIDVEN  --   --   --  0.8 0.8  --     Estimated Creatinine Clearance: 73.5 mL/min (by C-G formula based on SCr of 0.92 mg/dL).    No Known Allergies  Antimicrobials this admission: 8/11 Ceftriaxone>>8/14 8/15 Cefazolin>>8/15 8/17 Ceftriaxone >> 8/18 Vancomycin >> 8/20  Dose adjustments this admission:   Microbiology results: 8/11 UCx: >100,000 pan-sens Coagulase Neg Staph  8/13 MRSA PCR: negative 8/18 BCx: ngtd  Thank you for allowing pharmacy to be a part of this patient's care.  Loralee PacasErin Jaiyanna Safran, PharmD, BCPS Pager: [phone] 05/21/2016 2:23 PM

## 2016-05-22 LAB — TROPONIN I

## 2016-05-22 MED ORDER — FOLIC ACID 1 MG PO TABS
1.0000 mg | ORAL_TABLET | Freq: Every day | ORAL | Status: DC
  Administered 2016-05-23 – 2016-05-24 (×2): 1 mg via ORAL
  Filled 2016-05-22 (×2): qty 1

## 2016-05-22 MED ORDER — CHLORDIAZEPOXIDE HCL 25 MG PO CAPS
25.0000 mg | ORAL_CAPSULE | Freq: Once | ORAL | Status: AC
  Administered 2016-05-22: 25 mg via ORAL
  Filled 2016-05-22: qty 1

## 2016-05-22 MED ORDER — FAMOTIDINE 20 MG PO TABS
20.0000 mg | ORAL_TABLET | Freq: Two times a day (BID) | ORAL | Status: DC
  Administered 2016-05-22 – 2016-05-24 (×4): 20 mg via ORAL
  Filled 2016-05-22 (×4): qty 1

## 2016-05-22 MED ORDER — MORPHINE SULFATE (PF) 2 MG/ML IV SOLN
2.0000 mg | Freq: Once | INTRAVENOUS | Status: AC
  Administered 2016-05-22: 2 mg via INTRAVENOUS
  Filled 2016-05-22: qty 1

## 2016-05-22 NOTE — Progress Notes (Signed)
Key Points: Use following P&T approved IV to PO non-antibiotic change policy.  Description contains the criteria that are approved Note: Policy Excludes:  Esophagectomy patientsPHARMACIST - PHYSICIAN COMMUNICATION DR:   Cena BentonVega CONCERNING: IV to Oral Route Change Policy  RECOMMENDATION: This patient is receiving Pepcid, folic acid by the intravenous route.  Based on criteria approved by the Pharmacy and Therapeutics Committee, the intravenous medication(s) is/are being converted to the equivalent oral dose form(s).   DESCRIPTION: These criteria include:  The patient is eating (either orally or via tube) and/or has been taking other orally administered medications for a least 24 hours  The patient has no evidence of active gastrointestinal bleeding or impaired GI absorption (gastrectomy, short bowel, patient on TNA or NPO).  If you have questions about this conversion, please contact the Pharmacy Department  []   [phone]( [phone] )  [facility] []   [phone]( [phone] )  [facility]  []   [phone]( [phone] )  [facility] [x]   [phone]( [phone] )  [facility]  Earl ManyLegge, [place] [facility], [facility]PH 05/22/2016 1:24 PM

## 2016-05-22 NOTE — Progress Notes (Signed)
PROGRESS NOTE                                                                                                                                                                                                             Patient Demographics:    Troy Torres, is a 65 y.o. male, DOB - 03-30-1951, ZOX:[id number]  Admit date - 05/12/2016   Admitting Physician Eddie NorthNishant Dhungel, MD  Outpatient Primary MD for the patient is No PCP Per Patient  LOS - 9  Outpatient Specialists:None  Chief Complaint  Patient presents with  . 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 His last drink was 2 days ago (drinks 4 ounces of water daily). Denied fever, chills, nausea, vomiting, chest, shortness of breath bile or urinary symptoms. He did complain of suprapubic pain and tenderness on exam. Patient found to be mildly encephalopathic with impaired judgment secondary to alcohol withdrawal (CIWA 11). Also had UTI. Labs showed mildly elevated lipase, low magnesium and low vitamin B12 level. Admitted to hospitalist service for further management. Patient went into active DTs and was to hospital ICU on Precedex drip.    Subjective:   Spoke with sister and answered her questions. Pt still somnolent   Assessment  & Plan :    Principal Problem:   Alcohol withdrawal With  DTs (HCC) Continue monitoring in stepdown unit. Off precedex.  Continue patient on scheduled Ativan and CIWA monitoring.  - Psychiatry consulted and assisting. Pt on librium taper and seroquel will continue  Sepsis - Could be secondary to UTI vs cellulitis or both. Cellulitis at RLE at right foot.  Active Problems:   UTI (lower  urinary tract infection) Staph species on culture, pansensitive. Empiric Rocephin.  Mild alcoholic pancreatitis No abdominal symptoms. Currently nothing by mouth.    GERD (gastroesophageal reflux disease)  PPI  Hypomagnesemia/ hypokalemia Replenished  Vitamin B12 deficiency Level 175.  started supplement.  Tobacco abuse  nicotine patch.  Code Status : Full code  Family Communication  : None at bedside  Disposition Plan  : Start discharge planning to SNF.  Consults  :   PC CM  Procedures  :None  DVT Prophylaxis  :  Lovenox -  Lab Results  Component Value Date   PLT 219 05/20/2016    Antibiotics  :   Anti-infectives    Start     Dose/Rate Route Frequency Ordered Stop   05/19/16 1000  vancomycin (VANCOCIN) IVPB 750 mg/150 ml premix  Status:  Discontinued     750 mg 150 mL/hr over 60 Minutes Intravenous Every 12 hours 05/19/16 0834 05/21/16 1418   05/18/16 1700  cefTRIAXone (ROCEPHIN) 1 g in dextrose 5 % 50 mL IVPB     1 g 100 mL/hr over 30 Minutes Intravenous Every 24 hours 05/18/16 1647     05/16/16 0930  ceFAZolin (ANCEF) IVPB 1 g/50 mL premix     1 g 100 mL/hr over 30 Minutes Intravenous Every 8 hours 05/16/16 0847 05/16/16 2328   05/13/16 1400  cefTRIAXone (ROCEPHIN) 1 g in dextrose 5 % 50 mL IVPB  Status:  Discontinued     1 g 100 mL/hr over 30 Minutes Intravenous Every 24 hours 05/12/16 1846 05/15/16 1041   05/12/16 1515  cefTRIAXone (ROCEPHIN) 1 g in dextrose 5 % 50 mL IVPB     1 g 100 mL/hr over 30 Minutes Intravenous  Once 05/12/16 1504 05/12/16 1549        Objective:   Vitals:   05/21/16 1422 05/21/16 2000 05/22/16 0345 05/22/16 0428  BP: (!) 163/101 117/68 (!) 155/85 115/77  Pulse: 82 88 86 76  Resp: 20 18  20   Temp: 98 F (36.7 C) 97.6 F (36.4 C)  98.4 F (36.9 C)  TempSrc: Oral Oral  Oral  SpO2: 100% 99% 98% 100%  Weight:      Height:        Wt Readings from Last 3 Encounters:  05/12/16 64.1 kg (141 lb 4.8 oz)  03/12/15 66.6 kg  (146 lb 12.8 oz)  03/06/13 65.7 kg (144 lb 14.4 oz)     Intake/Output Summary (Last 24 hours) at 05/22/16 1259 Last data filed at 05/21/16 2300  Gross per 24 hour  Intake             1550 ml  Output             2000 ml  Net             -450 ml     Physical Exam  Gen:  Somnolent but arousable, responsive to simple commands, in nad. HEENT: MMM, supple neck Chest: clear b/l, no added sounds CVS: N S1&S2, no murmurs, rubs or gallop GI: soft, NT, ND, BS+ Musculoskeletal: warm, no edema CNS: no facial asymmetry, moves all extremities    Data Review:    CBC  Recent Labs Lab 05/16/16 0316 05/17/16 0346 05/19/16 0901 05/20/16 0605  WBC 6.7 6.3 7.5 5.5  HGB 11.4* 10.7* 11.4* 10.9*  HCT 34.4* 31.9* 32.9* 31.8*  PLT 137* 131* 212 219  MCV 86.9 85.1 85.7 85.7  MCH 28.8 28.5 29.7 29.4  MCHC 33.1 33.5 34.7 34.3  RDW 14.6 14.7 14.6 14.5  LYMPHSABS  --   --  0.9  --   MONOABS  --   --  2.0*  --   EOSABS  --   --  0.0  --   BASOSABS  --   --  0.0  --     Chemistries   Recent Labs Lab 05/16/16 0316 05/17/16 0346 05/19/16 0901  NA 134* 134*  136  K 3.7 3.9 3.9  CL 105 105 102  CO2 18* 22 25  GLUCOSE 70 161* 118*  BUN 14 7 11   CREATININE 0.85 0.93 0.92  CALCIUM 8.3* 8.8* 9.3  MG 1.6*  --   --   AST  --   --  15  ALT  --   --  11*  ALKPHOS  --   --  46  BILITOT  --   --  0.9   ------------------------------------------------------------------------------------------------------------------ No results for input(s): CHOL, HDL, LDLCALC, TRIG, CHOLHDL, LDLDIRECT in the last 72 hours.  Lab Results  Component Value Date   HGBA1C 5.4 09/26/2011   ------------------------------------------------------------------------------------------------------------------ No results for input(s): TSH, T4TOTAL, T3FREE, THYROIDAB in the last 72 hours.  Invalid input(s):  FREET3 ------------------------------------------------------------------------------------------------------------------ No results for input(s): VITAMINB12, FOLATE, FERRITIN, TIBC, IRON, RETICCTPCT in the last 72 hours.  Coagulation profile  Recent Labs Lab 05/19/16 0901  INR 1.05    No results for input(s): DDIMER in the last 72 hours.  Cardiac Enzymes  Recent Labs Lab 05/22/16 0548  TROPONINI <0.03   ------------------------------------------------------------------------------------------------------------------ No results found for: BNP  Inpatient Medications  Scheduled Meds: . cefTRIAXone (ROCEPHIN)  IV  1 g Intravenous Q24H  . famotidine (PEPCID) IV  20 mg Intravenous Q12H  . feeding supplement (ENSURE ENLIVE)  237 mL Oral BID BM  . folic acid  1 mg Intravenous Daily  . heparin  5,000 Units Subcutaneous Q8H  . LORazepam  0.5 mg Intravenous BID  . multivitamin with minerals  1 tablet Oral Daily  . nicotine  14 mg Transdermal Daily  . QUEtiapine  50 mg Oral BID  . thiamine  100 mg Oral Daily  . vitamin B-12  1,000 mcg Oral Daily   Continuous Infusions: . dextrose 5 % and 0.9% NaCl 100 mL/hr at 05/21/16 1628   PRN Meds:.acetaminophen **OR** acetaminophen, hydrALAZINE, ipratropium-albuterol, LORazepam  Micro Results Recent Results (from the past 240 hour(s))  MRSA PCR Screening     Status: None   Collection Time: 05/14/16  5:45 PM  Result Value Ref Range Status   MRSA by PCR NEGATIVE NEGATIVE Final    Comment:        The GeneXpert MRSA Assay (FDA approved for NASAL specimens only), is one component of a comprehensive MRSA colonization surveillance program. It is not intended to diagnose MRSA infection nor to guide or monitor treatment for MRSA infections.   Culture, blood (x 2)     Status: None (Preliminary result)   Collection Time: 05/19/16  9:01 AM  Result Value Ref Range Status   Specimen Description BLOOD RIGHT HAND  Final   Special Requests  BOTTLES DRAWN AEROBIC AND ANAEROBIC 10CC  Final   Culture   Final    NO GROWTH 2 DAYS Performed at [facility]    Report Status PENDING  Incomplete  Culture, blood (x 2)     Status: None (Preliminary result)   Collection Time: 05/19/16  9:01 AM  Result Value Ref Range Status   Specimen Description BLOOD RIGHT ANTECUBITAL  Final   Special Requests BOTTLES DRAWN AEROBIC AND ANAEROBIC 5CC  Final   Culture   Final    NO GROWTH 2 DAYS Performed at [facility]    Report Status PENDING  Incomplete    Radiology Reports Dg Chest 2 View  Result Date: 05/10/2016 CLINICAL DATA:  Chest pain. EXAM: CHEST  2 VIEW COMPARISON:  04/17/2016 FINDINGS: The heart size and mediastinal contours are within normal  limits. There is no evidence of pulmonary edema, consolidation, pneumothorax, nodule or pleural fluid. The visualized skeletal structures are unremarkable. IMPRESSION: No active cardiopulmonary disease. Electronically Signed   By: Irish Lack M.D.   On: 05/10/2016 11:38   Dg Chest Port 1 View  Result Date: 05/19/2016 CLINICAL DATA:  Sepsis.  Productive cough and fever. EXAM: PORTABLE CHEST 1 VIEW COMPARISON:  05/15/2016 FINDINGS: Both lungs are clear. Negative for airspace disease or pulmonary edema. Heart and mediastinum are within normal limits. No large pleural effusions. Negative for a pneumothorax. No acute bone abnormality IMPRESSION: No acute chest findings. Electronically Signed   By: Richarda Overlie M.D.   On: 05/19/2016 08:34   Dg Chest Port 1 View  Result Date: 05/15/2016 CLINICAL DATA:  Shortness of breath.  Altered mental status. EXAM: PORTABLE CHEST 1 VIEW COMPARISON:  05/10/2016. FINDINGS: Normal sized heart.  Clear lungs.  Unremarkable bones. IMPRESSION: No acute abnormality. Electronically Signed   By: Beckie Salts M.D.   On: 05/15/2016 12:48    Time Spent in minutes 35   Penny Pia M.D on 05/22/2016 at 12:59 PM  Between 7am to 7pm - Pager - [phone]  After  7pm go to [email or web address] - password [facility]  [facility] -  Office  [phone]

## 2016-05-22 NOTE — Progress Notes (Signed)
Pt was on Librium taper protocol per Psychiatry MD. Today's dose was scheduled for 10am but was showing that the order was completed. Spoke with Dr. Sharyne PeachJanardhana about this and informed him that RN had already spoken with Pharm MD and could not determine how the order was complete. Dr. Sharyne PeachJanardhana stated that the patient was on a taper and that we needed to find out how the order was stopped or who stopped it. RN informed Dr,. Sharyne PeachJanardhana that currently there were [facility]   Spoke with Blinda Leatherwoodom, Pharm MD again and he was able to determine that the taper dose was to end today per the original orders. It appears that yesterday was the BID taper dose and that it appears that the patient only received one dose and somehow the person charting that medication maybe charted on the wrong dose, thus completing the order. Per the original orders, there was a scheduled Daily dose for today and then the taper would be complete. Blinda Leatherwoodom, Pharm MD verified this information and a dose of Librium was given to the patient at about 4pm today. Per the original orders, this was to be the last dose of Librium per the taper. Dr. Morey HummingbirdJanardhana's note from today states that the Librium is discontinued.

## 2016-05-22 NOTE — Progress Notes (Signed)
Pt's sister Genia DelBonnie Strickland visited today and is concerned about pt's mentation. States pt had Traumatic Brain Injury about 10 years ago and was followed by Dr. Sandria ManlyLove. She states that he has been more forgetful lately (baseline is that he knows the month but not the date or day of the week or the year). States it has been about 10 years since his last MRI. Pt lives alone and when he get his check at the first of the month, he pays his bills and then buys alcohol and drinks until his money is gone. She states he doesn't eat properly during this time. Pt has no vehicle for transportation. Family is concerned about pt's wellbeing at home. States he cannot remember to take his medications everyday at home.

## 2016-05-22 NOTE — Evaluation (Signed)
Physical Therapy Evaluation Patient Details Name: Troy Torres MRN: [id number] DOB: [birth year] Today's Date: 05/22/2016   History of Present Illness  65 yo male admitted with ETOH withdrawal, sepsis. Hx of ETOH abuse, delirium tremens, Sz  Clinical Impression  On eval, pt required Mod assist for mobility. Multimodal cueing and increased time required for all tasks. He was able to stand x 2 at EOB with RW for ~15-20 seconds each time. Unable to safely attempt any transfers or ambulation on today. Recommend ST rehab at SNF. Will continue to follow and progress activity as safely able.     Follow Up Recommendations SNF    Equipment Recommendations  Rolling walker with 5" wheels    Recommendations for Other Services       Precautions / Restrictions Precautions Precautions: Fall Restrictions Weight Bearing Restrictions: No      Mobility  Bed Mobility Overal bed mobility: Needs Assistance Bed Mobility: Supine to Sit;Sit to Supine     Supine to sit: Mod assist;HOB elevated Sit to supine: Mod assist   General bed mobility comments: Assist for trunk and bil LEs. Utilized bedpad to aid with scooting to EOB. Increased time. Multimodal cueing required.  Transfers Overall transfer level: Needs assistance Equipment used: Rolling walker (2 wheeled) Transfers: Sit to/from Stand Sit to Stand: Mod assist;From elevated surface         General transfer comment: x2. Assist to weightshift, rise, stabilize, control descent. Pt only able to stand for ~15-20 seconds each attempt. Pt maintains flexed trunk and knee posturing despite cueing.   Ambulation/Gait             General Gait Details: Unable to safely attempt and steps on today due to high fall risk and +2 assist unavailable  Stairs            Wheelchair Mobility    Modified Rankin (Stroke Patients Only)       Balance Overall balance assessment: Needs assistance Sitting-balance support: Feet supported Sitting  balance-Leahy Scale: Fair Sitting balance - Comments: Mild posterior lean once seated at EOB Postural control: Posterior lean Standing balance support: Bilateral upper extremity supported Standing balance-Leahy Scale: Poor                               Pertinent Vitals/Pain Pain Assessment: Faces Pain Location: face-itching and discomfort Pain Descriptors / Indicators: Discomfort Pain Intervention(s): Monitored during session    Home Living Family/patient expects to be discharged to:: Unsure Living Arrangements: Alone             Home Equipment: None      Prior Function Level of Independence: Independent               Hand Dominance        Extremity/Trunk Assessment   Upper Extremity Assessment: Generalized weakness           Lower Extremity Assessment: Generalized weakness      Cervical / Trunk Assessment: Normal  Communication   Communication: No difficulties  Cognition Arousal/Alertness: Awake/alert Behavior During Therapy: WFL for tasks assessed/performed Overall Cognitive Status: Impaired/Different from baseline Area of Impairment: Orientation;Problem solving;Safety/judgement Orientation Level: Situation;Time;Place;Disoriented to       Safety/Judgement: Decreased awareness of safety   Problem Solving: Requires tactile cues;Requires verbal cues;Difficulty sequencing;Slow processing      General Comments      Exercises        Assessment/Plan    PT Assessment Patient  needs continued PT services  PT Diagnosis Difficulty walking;Generalized weakness;Abnormality of gait;Altered mental status   PT Problem List Decreased strength;Decreased activity tolerance;Decreased balance;Decreased mobility;Decreased coordination;Decreased cognition;Decreased knowledge of use of DME;Decreased safety awareness  PT Treatment Interventions DME instruction;Gait training;Functional mobility training;Balance training;Therapeutic  exercise;Therapeutic activities;Patient/family education   PT Goals (Current goals can be found in the Care Plan section) Acute Rehab PT Goals Patient Stated Goal: to get better PT Goal Formulation: With patient Time For Goal Achievement: 06/05/16 Potential to Achieve Goals: Good    Frequency Min 3X/week   Barriers to discharge        Co-evaluation               End of Session Equipment Utilized During Treatment: Gait belt Activity Tolerance: Patient tolerated treatment well Patient left: in bed;with call bell/phone within reach;with bed alarm set           Time: [id number] PT Time Calculation (min) (ACUTE ONLY): 21 min   Charges:   PT Evaluation $PT Eval Low Complexity: 1 Procedure     PT G Codes:        Troy Torres, MPT Pager: [phone]

## 2016-05-22 NOTE — Care Management Important Message (Signed)
Important Message  Patient Details  Name: Trinda PascalJames Summerhill MRN: [id number] Date of Birth: 10-15-1950   Medicare Important Message Given:  Yes    Haskell FlirtJamison, Shawne Eskelson 05/22/2016, 9:59 AMImportant Message  Patient Details  Name: Trinda PascalJames Rossa MRN: [id number] Date of Birth: 10-15-1950   Medicare Important Message Given:  Yes    Haskell FlirtJamison, Laprecious Austill 05/22/2016, 9:59 AM

## 2016-05-22 NOTE — Consult Note (Signed)
[facility] Face-to-Face Psychiatry Consult   Reason for Consult:  Alcohol withdrawal and confusion Referring Physician:  Dr. Cena Benton Patient Identification: Troy Torres MRN:  [id number] Principal Diagnosis: Alcohol withdrawal [facility]) Diagnosis:   Patient Active Problem List   Diagnosis Date Noted  . Hypomagnesemia [E83.42]   . Alcohol withdrawal (HCC) [F10.239] 05/12/2016  . UTI (lower urinary tract infection) [N39.0] 05/12/2016  . Acute encephalopathy [G93.40] 05/12/2016  . Abdominal pain [R10.9] 05/12/2016  . GERD (gastroesophageal reflux disease) [K21.9] 05/12/2016  . 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 Alcohol withdrawal delirium, acute, hyperactive (HCC) [F10.231] 05/12/2016  . Upper GI bleed [K92.2] 03/11/2015  . Acute GI bleeding [K92.2] 03/11/2015  . Acute blood loss anemia [D62] 03/11/2015  . Essential hypertension [I10] 03/11/2015  . Hypertension [I10] 01/29/2013  . Chronic diastolic heart failure (HCC) [I50.32]   . Pre-syncope [R55] 01/07/2013  . Iron deficiency anemia due to chronic blood loss [D50.0]   . Gastritis without bleeding [K29.70] 12/19/2011  . Gastric ulcer [K25.9] 12/19/2011  . Tobacco abuse [Z72.0] 10/26/2011  . History of seizure disorder [Z86.69] 09/26/2011    Total Time spent with patient: 30 minutes  Subjective:   Troy Torres is a 65 y.o. male patient admitted with abdominal pain alcohol withdrawal, confusion and restlessness.  HPI:  Troy Torres is a 65 y.o. male , seen face-to-face for psychiatric consultation and evaluation of alcoholic withdrawals syndrome including confusion, restlessness and required restraint with his waistband. Reportedly patient was admitted about 5-6 days ago with the limited response with his current medication treatment. Patient appeared to be in delirium tremens secondary to alcohol withdrawal symptoms. He is also receiving IV fluids, supportive therapy and Ativan detox treatment. Patient was not able to participate in  psychiatric evaluation secondary to altered mental status, confusion, restlessness and alcohol withdrawal symptoms. Patient has no known withdrawal seizure during this episode. Patient has no family members at bedside to provide collateral information. Information for this evaluation obtained from available medical records and also case discussed with the staff RN and LCSW.  Medical history: Patient with PMH significant for alcohol abuse (with hx of DT's and seizures in the past), GERD and tobacco abuse; who presented to ED complaining of abd pain. Patient reports pain was localized in his LUQ, crampy in nature, 8/10 in intensity and radiates to mid abdomen; no associated symptoms, nothing makes it better or worse. Patient reports last drink was 2 days or so ago due to ongoing pain in his abdomen. He denies fever, chills, nausea, vomiting, CP, SOB, hematochezia and melena. After physical exam initiated, pain was more present on his suprapubic region and mid abdomen; patient at this moment endorses some increase urine frequency, but denies any burning and hematuria. Patient was tremulous on exam and oriented X 2 only, judgement and insight appears to be impaired.   Past Psychiatric History: Alcohol abuse versus dependence and also history of delirium tremens and withdrawal seizures.  05/22/2016 Interval History: Patient seen with the psychiatric social service today and case discussed with the staff RN. Patient appeared more awake, alert, oriented to time place and person. 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 Patient has some memory difficulties. Patient has no current alcohol withdrawal symptoms or delirium tremens. Patient is requesting to shave his beard and also physical therapy which seems to be appropriate request. Patient denied current symptoms for depression, anxiety, hallucinations and denied suicidal/homicidal ideation, intention or  plans.  Risk to Self: Is  patient at risk for suicide?: No Risk to Others:   Prior Inpatient Therapy:   Prior Outpatient Therapy:    Past Medical History:  Past Medical History:  Diagnosis Date  . Alcohol abuse    with history of DTs // states last drink was 10/2012  . Angiodysplasia of cecum 10/2011   3 medium sized angiodysplastic lesions per colonoscopy (Dr. Madilyn FiremanHayes)  . Antral ulcer 10/2011   Small ulcer noted per Endoscopy (Dr. Madilyn FiremanHayes)  . Chronic diastolic heart failure (HCC)    grade 2 per 2D echo (12/2012)  . Esophagitis 10/2011   per EGD (10/2011)  . History of blood product transfusion ~ 2014   LGIB  . 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 Iron deficiency anemia due to chronic blood loss    BL Hgb 12-13  . Lower GI bleeding 2014  . 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 Sandria ManlyLove  . Tobacco abuse     Past Surgical History:  Procedure Laterality Date  . CATARACT EXTRACTION W/ INTRAOCULAR LENS  IMPLANT, BILATERAL Bilateral ~ 2014  . ESOPHAGOGASTRODUODENOSCOPY N/A 03/11/2015   Procedure: ESOPHAGOGASTRODUODENOSCOPY (EGD);  Surgeon: Vida RiggerMarc Magod, MD;  [facility];  Service: Endoscopy;  Laterality: N/A;   Family History:  Family History  Problem Relation Age of Onset  . Diabetes Mother 5960    deceased  . Diabetes Sister   . Heart attack Father 861    deceased  . Heart disease Sister 8936   Family Psychiatric  History: He has family history of alcohol especially his brother who also suffered with HIV and died.  Social History:  History  Alcohol Use  . 6.0 oz/week  . 10 Shots of liquor per week    Comment: 03/11/2015 pt states he quit 10/2012; son states he's "drinking 1/2 gallon of alcohol, vodka, per 3  weeks"     History  Drug Use No    Social History   Social History  . Marital status: Divorced    Spouse name: N/A  . Number of children: 2  . Years of education: 9th grade   Occupational History  . Disability     Since 2010    Social History Main Topics  . Smoking status: Current Every Day Smoker    Packs/day: 1.00    Years: 46.00    Types: Cigarettes  . Smokeless tobacco: Never Used  . Alcohol use 6.0 oz/week    10 Shots of liquor per week     Comment: 03/11/2015 pt states he quit 10/2012; son states he's "drinking 1/2 gallon of alcohol, vodka, per 3 weeks"  . Drug use: No  . Sexual activity: Not Asked   Other Topics Concern  . None   Social History Narrative   Collects social security/disability.  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   Lives alone - he is not allowed to drive because of his prior head injury. His sister-in-law helps provide transportation.   Additional Social History:    Allergies:  No Known Allergies  Labs:  Results for orders placed or performed during the hospital encounter of 05/12/16 (from the past 48 hour(s))  Troponin I     Status: None  Collection Time: 05/22/16  5:48 AM  Result Value Ref Range   Troponin I <0.03 <0.03 ng/mL    Current Facility-Administered Medications  Medication Dose Route Frequency Provider Last Rate Last Dose  . acetaminophen (TYLENOL) tablet 650 mg  650 mg Oral Q6H PRN Vassie Loll, MD   650 mg at 05/22/16 0521   Or  . acetaminophen (TYLENOL) suppository 650 mg  650 mg Rectal Q6H PRN Vassie Loll, MD   650 mg at 05/19/16 1610  . cefTRIAXone (ROCEPHIN) 1 g in dextrose 5 % 50 mL IVPB  1 g Intravenous Q24H Christine E Shade, RPH   1 g at 05/21/16 1627  . dextrose 5 %-0.9 % sodium chloride infusion   Intravenous Continuous Penny Pia, MD 100 mL/hr at 05/21/16 1628    . famotidine (PEPCID) IVPB 20 mg premix  20 mg Intravenous Q12H Nishant Dhungel, MD   20 mg at 05/22/16 1047  .  feeding supplement (ENSURE ENLIVE) (ENSURE ENLIVE) liquid 237 mL  237 mL Oral BID BM Penny Pia, MD   237 mL at 05/22/16 1047  . folic acid injection 1 mg  1 mg Intravenous Daily Jeanella Craze, NP   1 mg at 05/22/16 1047  . heparin injection 5,000 Units  5,000 Units Subcutaneous Q8H Vassie Loll, MD   5,000 Units at 05/22/16 0521  . hydrALAZINE (APRESOLINE) injection 10 mg  10 mg Intravenous Q8H PRN Vassie Loll, MD   10 mg at 05/18/16 0338  . ipratropium-albuterol (DUONEB) 0.5-2.5 (3) MG/3ML nebulizer solution 3 mL  3 mL Nebulization Q4H PRN Nishant Dhungel, MD      . LORazepam (ATIVAN) injection 0.5 mg  0.5 mg Intravenous BID Jeanella Craze, NP   0.5 mg at 05/22/16 1048  . LORazepam (ATIVAN) injection 1-4 mg  1-4 mg Intravenous Q1H PRN Jeanella Craze, NP   2 mg at 05/21/16 0849  . multivitamin with minerals tablet 1 tablet  1 tablet Oral Daily Leata Mouse, MD   1 tablet at 05/22/16 1048  . nicotine (NICODERM CQ - dosed in mg/24 hours) patch 14 mg  14 mg Transdermal Daily Vassie Loll, MD   14 mg at 05/22/16 1047  . QUEtiapine (SEROQUEL) tablet 50 mg  50 mg Oral BID Leata Mouse, MD   50 mg at 05/22/16 1048  . thiamine (VITAMIN B-1) tablet 100 mg  100 mg Oral Daily Leata Mouse, MD   100 mg at 05/22/16 1048  . vitamin B-12 (CYANOCOBALAMIN) tablet 1,000 mcg  1,000 mcg Oral Daily Nishant Dhungel, MD   1,000 mcg at 05/22/16 1048    Musculoskeletal: Strength & Muscle Tone: decreased Gait & Station: unable to stand Patient leans: N/A  Psychiatric Specialty Exam: Physical Exam as per history and physical   Review of Systems  Unable to perform ROS: Mental status change    Blood pressure 115/77, pulse 76, temperature 98.4 F (36.9 C), temperature source Oral, resp. rate 20, height 5\' 7"  (1.702 m), weight 64.1 kg (141 lb 4.8 oz), SpO2 100 %.Body mass index is 22.13 kg/m.  General Appearance: Casual and Disheveled  Eye Contact:  Good  Speech:  Clear and  Coherent  Volume:  Decreased  Mood:  Depressed  Affect:  Constricted  Thought Process:  Coherent and Goal Directed  Orientation:  Full (Time, Place, and Person)  Thought Content:  WDL and Logical  Suicidal Thoughts:  No  Homicidal Thoughts:  No  Memory:  Immediate;   Good Recent;   Fair  Judgement:  Fair  Insight:  Fair  Psychomotor Activity:  Decreased  Concentration:  Concentration: NA and Attention Span: NA  Recall:  Poor  Fund of Knowledge:  Fair  Language:  Fair  Akathisia:  Negative  Handed:  Right  AIMS (if indicated):     Assets:  Others:  To be determined  ADL's:  Others:  To be determined  Cognition:  Impaired,  Mild  Sleep:        Treatment Plan Summary: Patient has been suffering with alcohol withdrawal syndrome including delirium tremens and history of alcohol abuse and withdrawal seizures. Patient Daily has been resolved with the current medication management and his Librium has been stopped. Patient requesting physical therapy and skilled nursing facility. Placement which is appropriate.   Safety concerns: Patient has high fall risk but unknown self-injurious behaviors Delirium tremens: Resolved Discontinued Librium treatment  Continue Seroquel 50 mg twice daily for hallucinations Appreciate psychiatric consultation and we sign off as of today Please contact [phone] or [phone] if needs further assistance   Disposition: Patient benefit from physical rehabilitation therapy and may benefit from skilled nurse facility when medically stable No evidence of imminent risk to self or others at present.   Supportive therapy provided about ongoing stressors.  Leata MouseJANARDHANA Corey Caulfield, MD 05/22/2016 12:11 PM

## 2016-05-23 NOTE — Progress Notes (Signed)
Assessment unchanged. Coughing noted b/t swallowing at dinner time. Pt continue to cough at intervals.

## 2016-05-23 NOTE — Clinical Social Work Placement (Signed)
CSW spoke with patient's sister-in-law, Troy Torres & sister, Troy Torres via phone re: discharge planning - they are both agreeable with plan for patient to go to SNF at discharge. CSW sent information out to [facility] and will follow-up with SNF bed offers when available. CSW left voicemail for patient's brother, Troy Torres (ph#: [phone]).    Troy MaxinKelly Maguire Sime, LCSW [facility] Clinical Social Worker cell #: [phone]    CLINICAL SOCIAL WORK PLACEMENT  NOTE  Date:  05/23/2016  Patient Details  Name: Troy Torres MRN: [id number] Date of Birth: 1951/04/08  Clinical Social Work is seeking post-discharge placement for this patient at the Skilled  Nursing Facility level of care (*CSW will initial, date and re-position this form in  chart as items are completed):  Yes   Patient/family provided with [place] Clinical Social Work Department's list of facilities offering this level of care within the geographic area requested by the patient (or if unable, by the patient's family).  Yes   Patient/family informed of their freedom to choose among providers that offer the needed level of care, that participate in Medicare, Medicaid or managed care program needed by the patient, have an available bed and are willing to accept the patient.  Yes   Patient/family informed of [place]'s ownership interest in [facility] and [facility], as well as of the fact that they are under no obligation to receive care at these facilities.  PASRR submitted to EDS on 05/23/16     PASRR number received on 05/23/16     Existing PASRR number confirmed on       FL2 transmitted to all facilities in geographic area requested by pt/family on 05/23/16     FL2 transmitted to all facilities within larger geographic area on       Patient informed that his/her managed care company has contracts with or will negotiate with certain facilities, including the following:         Patient/family informed of bed offers received.  Patient chooses bed at       Physician recommends and patient chooses bed at      Patient to be transferred to   on  .  Patient to be transferred to facility by       Patient family notified on   of transfer.  Name of family member notified:        PHYSICIAN       Additional Comment:    _______________________________________________ Troy Torres, Seon Gaertner F, LCSW 05/23/2016, 12:10 PM

## 2016-05-23 NOTE — Clinical Social Work Placement (Signed)
Patient has a bed [facility]. CSW has completed FL2 & will continue to follow and assist with discharge when ready.    Troy MaxinKelly Jalila Goodnough, LCSW [facility] Clinical Social Worker cell #: [phone]     CLINICAL SOCIAL WORK PLACEMENT  NOTE  Date:  05/23/2016  Patient Details  Name: Troy Torres MRN: [id number] Date of Birth: 02/15/1951  Clinical Social Work is seeking post-discharge placement for this patient at the Skilled  Nursing Facility level of care (*CSW will initial, date and re-position this form in  chart as items are completed):  Yes   Patient/family provided with [place] Clinical Social Work Department's list of facilities offering this level of care within the geographic area requested by the patient (or if unable, by the patient's family).  Yes   Patient/family informed of their freedom to choose among providers that offer the needed level of care, that participate in Medicare, Medicaid or managed care program needed by the patient, have an available bed and are willing to accept the patient.  Yes   Patient/family informed of [facility]'s ownership interest in [facility] and [facility], as well as of the fact that they are under no obligation to receive care at these facilities.  PASRR submitted to EDS on 05/23/16     PASRR number received on 05/23/16     Existing PASRR number confirmed on       FL2 transmitted to all facilities in geographic area requested by pt/family on 05/23/16     FL2 transmitted to all facilities within larger geographic area on       Patient informed that his/her managed care company has contracts with or will negotiate with certain facilities, including the following:        Yes   Patient/family informed of bed offers received.  [facility]     Physician recommends and patient chooses bed at      Patient to be transferred to [facility] on   .  Patient to be transferred to facility by       Patient family notified on   of transfer.  Name of family member notified:        PHYSICIAN       Additional Comment:    _______________________________________________ Troy Torres, Troy Romano F, LCSW 05/23/2016, 3:38 PM

## 2016-05-23 NOTE — NC FL2 (Signed)
[place] MEDICAID FL2 LEVEL OF CARE SCREENING TOOL     IDENTIFICATION  Patient Name: Troy Torres Birthdate: [birth year] Sex: male Admission Date (Current Location): 05/12/2016  [facility] [place]   Facility and Address:  [facility],  [address], [place] [id number]      Provider Number: [id number]  Attending Physician Name and Address:  Penny Piarlando Vega, MD  Relative Name and Phone Number:       Current Level of Care: Hospital Recommended Level of Care: Skilled Nursing Facility Prior Approval Number:    Date Approved/Denied:   PASRR Number: [id number]  Discharge Plan: SNF    Current Diagnoses: Patient Active Problem List   Diagnosis Date Noted  . Hypomagnesemia   . Alcohol withdrawal (HCC) 05/12/2016  . UTI (lower urinary tract infection) 05/12/2016  . Acute encephalopathy 05/12/2016  . Abdominal pain 05/12/2016  . GERD (gastroesophageal reflux disease) 05/12/2016  . 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 Alcohol withdrawal delirium, acute, hyperactive (HCC) 05/12/2016  . Upper GI bleed 03/11/2015  . Acute GI bleeding 03/11/2015  . Acute blood loss anemia 03/11/2015  . Essential hypertension 03/11/2015  . Hypertension 01/29/2013  . Chronic diastolic heart failure (HCC)   . Pre-syncope 01/07/2013  . Iron deficiency anemia due to chronic blood loss   . Gastritis without bleeding 12/19/2011  . Gastric ulcer 12/19/2011  . Tobacco abuse 10/26/2011  . History of seizure disorder 09/26/2011    Orientation RESPIRATION BLADDER Height & Weight     Self  Normal Incontinent Weight: 141 lb 4.8 oz (64.1 kg) Height:  5\' 7"  (170.2 cm)  BEHAVIORAL SYMPTOMS/MOOD NEUROLOGICAL BOWEL NUTRITION STATUS      Continent Diet (Heart)  AMBULATORY STATUS COMMUNICATION OF NEEDS Skin   Extensive Assist Verbally Normal                       Personal Care Assistance Level of Assistance  Bathing, Dressing Bathing Assistance: Limited assistance   Dressing Assistance: Limited assistance     Functional Limitations Info             SPECIAL CARE FACTORS FREQUENCY  PT (By licensed PT), OT (By licensed OT)     PT Frequency: 5 OT Frequency: 5            Contractures      Additional Factors Info  Code Status, Allergies Code Status Info: Fullcode Allergies Info: NKDA           Current Medications (05/23/2016):  This is the current hospital active medication list Current Facility-Administered Medications  Medication Dose Route Frequency Provider Last Rate Last Dose  . acetaminophen (TYLENOL) tablet 650 mg  650 mg Oral Q6H PRN Vassie Lollarlos Madera, MD   650 mg at 05/22/16 0521   Or  . acetaminophen (TYLENOL) suppository 650 mg  650 mg Rectal Q6H PRN Vassie Lollarlos Madera, MD   650 mg at 05/19/16 [id number]  . cefTRIAXone (ROCEPHIN) 1 g in dextrose 5 % 50 mL IVPB  1 g Intravenous Q24H Christine E Shade, RPH   1 g at 05/22/16 1647  . dextrose 5 %-0.9 % sodium chloride infusion   Intravenous Continuous Penny Piarlando Vega, MD 100 mL/hr at 05/23/16 0937    . famotidine (PEPCID) tablet 20 mg  20 mg Oral BID Hessie KnowsJustin M Legge, RPH   20 mg at 05/23/16 0931  . feeding supplement (ENSURE ENLIVE) (ENSURE ENLIVE) liquid 237 mL  237 mL Oral BID BM Penny Piarlando Vega, MD  237 mL at 05/23/16 0933  . folic acid (FOLVITE) tablet 1 mg  1 mg Oral Daily Hessie KnowsJustin M Legge, RPH   1 mg at 05/23/16 0931  . heparin injection 5,000 Units  5,000 Units Subcutaneous Q8H Vassie Lollarlos Madera, MD   5,000 Units at 05/23/16 [phone]  . hydrALAZINE (APRESOLINE) injection 10 mg  10 mg Intravenous Q8H PRN Vassie Lollarlos Madera, MD   10 mg at 05/18/16 0338  . ipratropium-albuterol (DUONEB) 0.5-2.5 (3) MG/3ML nebulizer solution 3 mL  3 mL Nebulization Q4H PRN Nishant Dhungel, MD      . LORazepam (ATIVAN) injection 0.5 mg  0.5 mg Intravenous BID Jeanella CrazeBrandi L Ollis, NP   0.5 mg at 05/23/16 0930  . LORazepam (ATIVAN) injection 1-4 mg  1-4 mg Intravenous Q1H PRN Jeanella CrazeBrandi L Ollis, NP   2 mg at 05/22/16 2025  . multivitamin  with minerals tablet 1 tablet  1 tablet Oral Daily Leata MouseJanardhana Jonnalagadda, MD   1 tablet at 05/23/16 0931  . nicotine (NICODERM CQ - dosed in mg/24 hours) patch 14 mg  14 mg Transdermal Daily Vassie Lollarlos Madera, MD   14 mg at 05/23/16 0931  . QUEtiapine (SEROQUEL) tablet 50 mg  50 mg Oral BID Leata MouseJanardhana Jonnalagadda, MD   50 mg at 05/23/16 0931  . thiamine (VITAMIN B-1) tablet 100 mg  100 mg Oral Daily Leata MouseJanardhana Jonnalagadda, MD   100 mg at 05/23/16 0931  . vitamin B-12 (CYANOCOBALAMIN) tablet 1,000 mcg  1,000 mcg Oral Daily Nishant Dhungel, MD   1,000 mcg at 05/23/16 [id number]     Discharge Medications: Please see discharge summary for a list of discharge medications.  Relevant Imaging Results:  Relevant Lab Results:   Additional Information SSN: [id number]  Arlyss RepressHarrison, Hayle Parisi F, LCSW

## 2016-05-23 NOTE — Progress Notes (Addendum)
LCSWA and Psychiatrist MD met with patient at bedside 8/21. Patient alert and oriented x3. Patient reports he is feeling better and able to eat. Patient explained his sister in law- Horris Latino came to visit him over weekend. Patient reports he is having pain and swelling in his right knee, stated PT was recommending SNF at this time.   CSW-Kelly to follow up for placement.  Kathrin Greathouse, Latanya Presser, MSW Clinical Social Worker [place] and Psychiatric Service Line [phone]

## 2016-05-23 NOTE — Progress Notes (Signed)
PROGRESS NOTE                                                                                                                                                                                                             Patient Demographics:    Troy Torres, is a 65 y.o. male, DOB - Apr 27, 1951, ZOX:[id number]  Admit date - 05/12/2016   Admitting Physician Eddie NorthNishant Dhungel, MD  Outpatient Primary MD for the patient is No PCP Per Patient  LOS - 10  Outpatient Specialists:None  Chief Complaint  Patient presents with  . 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 His last drink was 2 days ago (drinks 4 ounces of water daily). Denied fever, chills, nausea, vomiting, chest, shortness of breath bile or urinary symptoms. He did complain of suprapubic pain and tenderness on exam. Patient found to be mildly encephalopathic with impaired judgment secondary to alcohol withdrawal (CIWA 11). Also had UTI. Labs showed mildly elevated lipase, low magnesium and low vitamin B12 level. Admitted to hospitalist service for further management. Patient went into active DTs and was to hospital ICU on Precedex drip.    Subjective:   Pt more alert today. Reporting no new complaints.    Assessment  & Plan :    Principal Problem:   Alcohol withdrawal With  DTs (HCC) Continue monitoring in stepdown unit. Off precedex.  Continue patient on scheduled Ativan and CIWA monitoring.  - Psychiatry consulted and assisting. Pt on librium taper and seroquel will continue  Sepsis - Could be secondary to UTI vs cellulitis or both. Cellulitis at RLE at right foot.  Active Problems:   UTI (lower urinary  tract infection) Staph species on culture, pansensitive. Empiric Rocephin.  Mild alcoholic pancreatitis No abdominal symptoms. Currently nothing by mouth.    GERD (gastroesophageal reflux disease)  PPI  Hypomagnesemia/ hypokalemia Replenished  Vitamin B12 deficiency Level 175.  started supplement.  Tobacco abuse  nicotine patch.  Code Status : Full code  Family Communication  : None at bedside  Disposition Plan  : Start discharge planning to SNF once bed available  may consider d/c.  Consults  :   PC CM  Procedures  :None  DVT Prophylaxis  :  Lovenox -  Lab Results  Component Value Date   PLT 219 05/20/2016    Antibiotics  :   Anti-infectives    Start     Dose/Rate Route Frequency Ordered Stop   05/19/16 1000  vancomycin (VANCOCIN) IVPB 750 mg/150 ml premix  Status:  Discontinued     750 mg 150 mL/hr over 60 Minutes Intravenous Every 12 hours 05/19/16 0834 05/21/16 1418   05/18/16 1700  cefTRIAXone (ROCEPHIN) 1 g in dextrose 5 % 50 mL IVPB     1 g 100 mL/hr over 30 Minutes Intravenous Every 24 hours 05/18/16 1647     05/16/16 0930  ceFAZolin (ANCEF) IVPB 1 g/50 mL premix     1 g 100 mL/hr over 30 Minutes Intravenous Every 8 hours 05/16/16 0847 05/16/16 2328   05/13/16 1400  cefTRIAXone (ROCEPHIN) 1 g in dextrose 5 % 50 mL IVPB  Status:  Discontinued     1 g 100 mL/hr over 30 Minutes Intravenous Every 24 hours 05/12/16 1846 05/15/16 1041   05/12/16 1515  cefTRIAXone (ROCEPHIN) 1 g in dextrose 5 % 50 mL IVPB     1 g 100 mL/hr over 30 Minutes Intravenous  Once 05/12/16 1504 05/12/16 1549        Objective:   Vitals:   05/22/16 2000 05/22/16 2100 05/23/16 0000 05/23/16 0400  BP: (!) 159/77 (!) 153/83 (!) 157/86 (!) 154/71  Pulse: 100 85 93 83  Resp: 18 16 18 16   Temp: 98.6 F (37 C) 98.9 F (37.2 C) 98.7 F (37.1 C) 99.3 F (37.4 C)  TempSrc: Oral Oral Oral Oral  SpO2: 98% 99% 99% 100%  Weight:      Height:        Wt Readings from Last 3  Encounters:  05/12/16 64.1 kg (141 lb 4.8 oz)  03/12/15 66.6 kg (146 lb 12.8 oz)  03/06/13 65.7 kg (144 lb 14.4 oz)     Intake/Output Summary (Last 24 hours) at 05/23/16 1340 Last data filed at 05/23/16 0900  Gross per 24 hour  Intake          2628.33 ml  Output              750 ml  Net          1878.33 ml     Physical Exam  Gen:  Awake and alert, in nad. HEENT: MMM, supple neck Chest: clear b/l, no added sounds CVS: N S1&S2, no murmurs, rubs or gallop GI: soft, NT, ND, BS+ Musculoskeletal: warm, no edema CNS: no facial asymmetry, moves all extremities    Data Review:    CBC  Recent Labs Lab 05/17/16 0346 05/19/16 0901 05/20/16 0605  WBC 6.3 7.5 5.5  HGB 10.7* 11.4* 10.9*  HCT 31.9* 32.9* 31.8*  PLT 131* 212 219  MCV 85.1 85.7 85.7  MCH 28.5 29.7 29.4  MCHC 33.5 34.7 34.3  RDW 14.7 14.6 14.5  LYMPHSABS  --  0.9  --   MONOABS  --  2.0*  --   EOSABS  --  0.0  --   BASOSABS  --  0.0  --     Chemistries   Recent Labs Lab 05/17/16 0346 05/19/16 0901  NA 134* 136  K 3.9 3.9  CL 105 102  CO2 22 25  GLUCOSE 161* 118*  BUN 7 11  CREATININE 0.93 0.92  CALCIUM 8.8* 9.3  AST  --  15  ALT  --  11*  ALKPHOS  --  46  BILITOT  --  0.9   ------------------------------------------------------------------------------------------------------------------ No results for input(s): CHOL, HDL, LDLCALC, TRIG, CHOLHDL, LDLDIRECT in the last 72 hours.  Lab Results  Component Value Date   HGBA1C 5.4 09/26/2011   ------------------------------------------------------------------------------------------------------------------ No results for input(s): TSH, T4TOTAL, T3FREE, THYROIDAB in the last 72 hours.  Invalid input(s): FREET3 ------------------------------------------------------------------------------------------------------------------ No results for input(s): VITAMINB12, FOLATE, FERRITIN, TIBC, IRON, RETICCTPCT in the last 72 hours.  Coagulation  profile  Recent Labs Lab 05/19/16 0901  INR 1.05    No results for input(s): DDIMER in the last 72 hours.  Cardiac Enzymes  Recent Labs Lab 05/22/16 0548  TROPONINI <0.03   ------------------------------------------------------------------------------------------------------------------ No results found for: BNP  Inpatient Medications  Scheduled Meds: . cefTRIAXone (ROCEPHIN)  IV  1 g Intravenous Q24H  . famotidine  20 mg Oral BID  . feeding supplement (ENSURE ENLIVE)  237 mL Oral BID BM  . folic acid  1 mg Oral Daily  . heparin  5,000 Units Subcutaneous Q8H  . LORazepam  0.5 mg Intravenous BID  . multivitamin with minerals  1 tablet Oral Daily  . nicotine  14 mg Transdermal Daily  . QUEtiapine  50 mg Oral BID  . thiamine  100 mg Oral Daily  . vitamin B-12  1,000 mcg Oral Daily   Continuous Infusions: . dextrose 5 % and 0.9% NaCl 100 mL/hr at 05/23/16 0937   PRN Meds:.acetaminophen **OR** acetaminophen, hydrALAZINE, ipratropium-albuterol, LORazepam  Micro Results Recent Results (from the past 240 hour(s))  MRSA PCR Screening     Status: None   Collection Time: 05/14/16  5:45 PM  Result Value Ref Range Status   MRSA by PCR NEGATIVE NEGATIVE Final    Comment:        The GeneXpert MRSA Assay (FDA approved for NASAL specimens only), is one component of a comprehensive MRSA colonization surveillance program. It is not intended to diagnose MRSA infection nor to guide or monitor treatment for MRSA infections.   Culture, blood (x 2)     Status: None (Preliminary result)   Collection Time: 05/19/16  9:01 AM  Result Value Ref Range Status   Specimen Description BLOOD RIGHT HAND  Final   Special Requests BOTTLES DRAWN AEROBIC AND ANAEROBIC 10CC  Final   Culture   Final    NO GROWTH 3 DAYS Performed at [facility]    Report Status PENDING  Incomplete  Culture, blood (x 2)     Status: None (Preliminary result)   Collection Time: 05/19/16  9:01 AM   Result Value Ref Range Status   Specimen Description BLOOD RIGHT ANTECUBITAL  Final   Special Requests BOTTLES DRAWN AEROBIC AND ANAEROBIC 5CC  Final   Culture   Final    NO GROWTH 3 DAYS Performed at [facility]    Report Status PENDING  Incomplete    Radiology Reports Dg Chest 2 View  Result Date: 05/10/2016 CLINICAL DATA:  Chest pain. EXAM: CHEST  2 VIEW COMPARISON:  04/17/2016 FINDINGS: The heart size and mediastinal contours are within normal limits. There is no evidence of pulmonary edema, consolidation, pneumothorax, nodule or pleural fluid. The visualized skeletal structures are unremarkable. IMPRESSION: No active cardiopulmonary disease. Electronically Signed   By: Irish Lack M.D.   On: 05/10/2016 11:38   Dg Chest Port 1 View  Result Date: 05/19/2016 CLINICAL DATA:  Sepsis.  Productive  cough and fever. EXAM: PORTABLE CHEST 1 VIEW COMPARISON:  05/15/2016 FINDINGS: Both lungs are clear. Negative for airspace disease or pulmonary edema. Heart and mediastinum are within normal limits. No large pleural effusions. Negative for a pneumothorax. No acute bone abnormality IMPRESSION: No acute chest findings. Electronically Signed   By: Richarda Overlie M.D.   On: 05/19/2016 08:34   Dg Chest Port 1 View  Result Date: 05/15/2016 CLINICAL DATA:  Shortness of breath.  Altered mental status. EXAM: PORTABLE CHEST 1 VIEW COMPARISON:  05/10/2016. FINDINGS: Normal sized heart.  Clear lungs.  Unremarkable bones. IMPRESSION: No acute abnormality. Electronically Signed   By: Beckie Salts M.D.   On: 05/15/2016 12:48    Time Spent in minutes 35   Penny Pia M.D on 05/23/2016 at 1:40 PM  Between 7am to 7pm - Pager - [phone]  After 7pm go to [email or web address] - password [facility]  [facility] -  Office  [phone]

## 2016-05-24 DIAGNOSIS — E538 Deficiency of other specified B group vitamins: Secondary | ICD-10-CM | POA: Diagnosis present

## 2016-05-24 DIAGNOSIS — I10 Essential (primary) hypertension: Secondary | ICD-10-CM

## 2016-05-24 LAB — CULTURE, BLOOD (ROUTINE X 2)
Culture: NO GROWTH
Culture: NO GROWTH

## 2016-05-24 MED ORDER — NICOTINE 14 MG/24HR TD PT24: 14.0000 mg | MEDICATED_PATCH | Freq: Every day | TRANSDERMAL | 0 refills | Status: AC

## 2016-05-24 MED ORDER — LORAZEPAM 1 MG PO TABS: 1.0000 mg | ORAL_TABLET | Freq: Three times a day (TID) | ORAL | 0 refills | Status: AC | PRN

## 2016-05-24 MED ORDER — AMLODIPINE BESYLATE 5 MG PO TABS
5.0000 mg | ORAL_TABLET | Freq: Every day | ORAL | Status: DC
  Administered 2016-05-24: 5 mg via ORAL
  Filled 2016-05-24: qty 1

## 2016-05-24 MED ORDER — THIAMINE HCL 100 MG PO TABS: 100.0000 mg | ORAL_TABLET | Freq: Every day | ORAL | 0 refills | Status: AC

## 2016-05-24 MED ORDER — QUETIAPINE FUMARATE 50 MG PO TABS: 50.0000 mg | ORAL_TABLET | Freq: Every day | ORAL | 0 refills | Status: AC

## 2016-05-24 MED ORDER — AMLODIPINE BESYLATE 5 MG PO TABS: 5.0000 mg | ORAL_TABLET | Freq: Every day | ORAL | 0 refills | Status: AC

## 2016-05-24 MED ORDER — FOLIC ACID 1 MG PO TABS: 1.0000 mg | ORAL_TABLET | Freq: Every day | ORAL | 0 refills | Status: AC

## 2016-05-24 MED ORDER — CYANOCOBALAMIN 1000 MCG PO TABS: 1000.0000 ug | ORAL_TABLET | Freq: Every day | ORAL | 0 refills | Status: AC

## 2016-05-24 MED ORDER — ENSURE ENLIVE PO LIQD: 237.0000 mL | Freq: Two times a day (BID) | ORAL | 12 refills | Status: AC

## 2016-05-24 MED ORDER — OMEPRAZOLE 20 MG PO CPDR: 20.0000 mg | DELAYED_RELEASE_CAPSULE | Freq: Every day | ORAL | 1 refills | Status: AC

## 2016-05-24 MED ORDER — ADULT MULTIVITAMIN W/MINERALS CH: 1.0000 | ORAL_TABLET | Freq: Every day | ORAL | 0 refills | Status: AC

## 2016-05-24 NOTE — Progress Notes (Signed)
Pt resting, going to [facility] for rehab. Report is called. PT VSS. CIWA is a low 2-3 score. NSR on monitor. Monitor and IV removed. Safe for transport.

## 2016-05-24 NOTE — Care Management Note (Signed)
Case Management Note  Patient Details  Name: Troy Torres MRN: [id number] Date of Birth: May 14, 1951  Subjective/Objective:                    Action/Plan:d/c SNF   Expected Discharge Date:                  Expected Discharge Plan:  Skilled Nursing Facility  In-House Referral:     Discharge planning Services     Post Acute Care Choice:    Choice offered to:     DME Arranged:    DME Agency:     HH Arranged:    HH Agency:     Status of Service:  Completed, signed off  If discussed at [employer] Length of Stay Meetings, dates discussed:    Additional Comments:  Lanier ClamMahabir, Mayela Bullard, RN 05/24/2016, 1:55 PM

## 2016-05-24 NOTE — Clinical Social Work Placement (Signed)
[facility]. Patient & brother, [name] aware. Discharge packet given to RN, [name]. [facility] called for transport.     Lincoln MaxinKelly Ellie Bryand, LCSW [facility] Clinical Social Worker cell #: [phone]    CLINICAL SOCIAL WORK PLACEMENT  NOTE  Date:  05/24/2016  Patient Details  Name: Troy Torres Kassem MRN: [id number] Date of Birth: 09-09-1951  Clinical Social Work is seeking post-discharge placement for this patient at the Skilled  Nursing Facility level of care (*CSW will initial, date and re-position this form in  chart as items are completed):  Yes   Patient/family provided with [facility] Clinical Social Work Department's list of facilities offering this level of care within the geographic area requested by the patient (or if unable, by the patient's family).  Yes   Patient/family informed of their freedom to choose among providers that offer the needed level of care, that participate in Medicare, Medicaid or managed care program needed by the patient, have an available bed and are willing to accept the patient.  Yes   Patient/family informed of [facility]'s ownership interest in [facility] and [facility], as well as of the fact that they are under no obligation to receive care at these facilities.  PASRR submitted to EDS on 05/23/16     PASRR number received on 05/23/16     Existing PASRR number confirmed on       FL2 transmitted to all facilities in geographic area requested by pt/family on 05/23/16     FL2 transmitted to all facilities within larger geographic area on       Patient informed that his/her managed care company has contracts with or will negotiate with certain facilities, including the following:        Yes   Patient/family informed of bed offers received.  [facility]     Physician recommends and patient chooses bed at      [facility].  Patient to be transferred to facility by [facility]     Patient family notified on 05/24/16 of transfer.  Name of family member notified:  patient's brother, [name] via phone     PHYSICIAN       Additional Comment:    _______________________________________________ Arlyss RepressHarrison, Gwenn Teodoro F, LCSW 05/24/2016, 2:46 PM

## 2016-05-24 NOTE — Discharge Summary (Signed)
Physician Discharge Summary  Troy Torres ZOX:[id number] DOB: 1950-12-16 DOA: 05/12/2016  PCP: No PCP Per Patient  Admit date: 05/12/2016 Discharge date: 05/24/2016  Admitted From: Home Disposition:  SNF ([facility])  Recommendations for Outpatient Follow-up:  1. Follow up with M.D. at SNF in 1 week  Home Health:None Equipment/Devices:none  Discharge Condition: Fair CODE STATUS: Full code Diet recommendation: Low sodium   Discharge Diagnoses:  Principal Problem:   Alcohol withdrawal delirium, acute, hyperactive (HCC)   Active Problems:   UTI (lower urinary tract infection)   Acute encephalopathy   Abdominal pain   GERD (gastroesophageal reflux disease)   Alcoholic pancreatitis   Hypomagnesemia   B12 deficiency   Essential hypertension, benign  Brief narrative/history of present illness Please refer to admission H&P for details, in brief, 65 year old male with alcohol abuse with history of DTs and seizures in the past, GERD, ongoing tobacco use presented with left upper quadrant crampy abdominal pain radiating to mid abdomen without aggravating or relieving factors and no associated symptoms. His last drink was 2 days ago (drinks 4 ounces of water daily). Denied fever, chills, nausea, vomiting, chest, shortness of breath bile or urinary symptoms. He did complain of suprapubic pain and tenderness on exam. Patient found to be mildly encephalopathic with impaired judgment secondary to alcohol withdrawal (CIWA 11). Also had UTI. Labs showed mildly elevated lipase, low magnesium and low vitamin B12 level. Admitted to hospitalist service for further management. Patient went into active DTs and was transferred to hospital ICU on Precedex drip.  Principal Problem:   Alcohol withdrawal With  DTs [facility]) Patient required ICU monitoring on Precedex. This was tapered off and was then placed on Librium taper. Now has CIWA of 2-3 on as needed Ativan. Continue thiamine, folate and  multivitamin. Patient counseled on alcohol cessation. -Appreciate psychiatry consultation for hallucination. Placed on Seroquel. Hallucinations have resolved. He does have some confusion gets disoriented to place. Patient seen by physical therapy and recommended SNF. I will discharge him on low-dose Ativan when necessary for withdrawal symptoms.    Sepsis Suspected due to UTI versus cellulitis of the right foot. Sepsis resolved. Has completed antibiotics.  Active Problems:   UTI (lower urinary tract infection) Staph species on culture, pansensitive. Received empiric Rocephin.  Mild alcoholic pancreatitis On admission. No further symptoms. Tolerating advanced diet.    GERD (gastroesophageal reflux disease) Continue PPI  Hypomagnesemia/ hypokalemia Replenished  Vitamin B12 deficiency Level 175.  started supplement.  Tobacco abuse  nicotine patch.  Essential hypertension Blood pressure consistently elevated. Added low dose amlodipine.   Family Communication  : None at bedside  Disposition : SNF as recommended by PT  Consults  :   PC CM  Procedures  :None Discharge Instructions     Medication List    TAKE these medications   amLODipine 5 MG tablet Commonly known as:  NORVASC Take 1 tablet (5 mg total) by mouth daily.   cyanocobalamin 1000 MCG tablet Take 1 tablet (1,000 mcg total) by mouth daily.   feeding supplement (ENSURE ENLIVE) Liqd Take 237 mLs by mouth 2 (two) times daily between meals.   folic acid 1 MG tablet Commonly known as:  FOLVITE Take 1 tablet (1 mg total) by mouth daily.   LORazepam 1 MG tablet Commonly known as:  ATIVAN Take 1 tablet (1 mg total) by mouth every 8 (eight) hours as needed for anxiety (alcohol withdrawal symptoms).   multivitamin with minerals Tabs tablet Take 1 tablet by mouth daily.  nicotine 14 mg/24hr patch Commonly known as:  NICODERM CQ - dosed in mg/24 hours Place 1 patch (14 mg total) onto the skin  daily.   omeprazole 20 MG capsule Commonly known as:  PRILOSEC Take 1 capsule (20 mg total) by mouth daily. What changed:  when to take this   QUEtiapine 50 MG tablet Commonly known as:  SEROQUEL Take 1 tablet (50 mg total) by mouth at bedtime.   thiamine 100 MG tablet Take 1 tablet (100 mg total) by mouth daily.      Follow-up Information    Please use the list of medicare providers given to you by the ED case manager to find a new Doctor for follow up care. Schedule an appointment as soon as possible for a visit on 05/12/2016.   Why:  for re-evaluation       MD at SNF in 1 week .          No Known Allergies       Procedures/Studies: Dg Chest 2 View  Result Date: 05/10/2016 CLINICAL DATA:  Chest pain. EXAM: CHEST  2 VIEW COMPARISON:  04/17/2016 FINDINGS: The heart size and mediastinal contours are within normal limits. There is no evidence of pulmonary edema, consolidation, pneumothorax, nodule or pleural fluid. The visualized skeletal structures are unremarkable. IMPRESSION: No active cardiopulmonary disease. Electronically Signed   By: Irish Lack M.D.   On: 05/10/2016 11:38   Dg Chest Port 1 View  Result Date: 05/19/2016 CLINICAL DATA:  Sepsis.  Productive cough and fever. EXAM: PORTABLE CHEST 1 VIEW COMPARISON:  05/15/2016 FINDINGS: Both lungs are clear. Negative for airspace disease or pulmonary edema. Heart and mediastinum are within normal limits. No large pleural effusions. Negative for a pneumothorax. No acute bone abnormality IMPRESSION: No acute chest findings. Electronically Signed   By: Richarda Overlie M.D.   On: 05/19/2016 08:34   Dg Chest Port 1 View  Result Date: 05/15/2016 CLINICAL DATA:  Shortness of breath.  Altered mental status. EXAM: PORTABLE CHEST 1 VIEW COMPARISON:  05/10/2016. FINDINGS: Normal sized heart.  Clear lungs.  Unremarkable bones. IMPRESSION: No acute abnormality. Electronically Signed   By: Beckie Salts M.D.   On: 05/15/2016 12:48        Subjective: Denies any dizziness or hallucinations. Has some confusion.  Discharge Exam: Vitals:   05/23/16 2030 05/24/16 0500  BP: (!) 163/90 (!) 163/97  Pulse: 94 78  Resp: 20 20  Temp: 98.2 F (36.8 C) 97.9 F (36.6 C)   Vitals:   05/23/16 0400 05/23/16 1342 05/23/16 2030 05/24/16 0500  BP: (!) 154/71 (!) 150/81 (!) 163/90 (!) 163/97  Pulse: 83 82 94 78  Resp: 16 18 20 20   Temp: 99.3 F (37.4 C) 97.6 F (36.4 C) 98.2 F (36.8 C) 97.9 F (36.6 C)  TempSrc: Oral Oral Oral Oral  SpO2: 100% 100% 100% 98%  Weight:      Height:        General: Middle aged male not in distress HEENT: No pallor, moist mucosa, supple neck Chest: Clear bilaterally, no added sounds CVS: Normal S1 and S2, no murmurs rub or gallop GI: Soft, nondistended, nontender, bowel sounds present Muscle skeletal: Warm, no edema CNS: Alert and oriented, no tremors, some confusion to place    The results of significant diagnostics from this hospitalization (including imaging, microbiology, ancillary and laboratory) are listed below for reference.     Microbiology: Recent Results (from the past 240 hour(s))  MRSA PCR Screening  Status: None   Collection Time: 05/14/16  5:45 PM  Result Value Ref Range Status   MRSA by PCR NEGATIVE NEGATIVE Final    Comment:        The GeneXpert MRSA Assay (FDA approved for NASAL specimens only), is one component of a comprehensive MRSA colonization surveillance program. It is not intended to diagnose MRSA infection nor to guide or monitor treatment for MRSA infections.   Culture, blood (x 2)     Status: None (Preliminary result)   Collection Time: 05/19/16  9:01 AM  Result Value Ref Range Status   Specimen Description BLOOD RIGHT HAND  Final   Special Requests BOTTLES DRAWN AEROBIC AND ANAEROBIC 10CC  Final   Culture   Final    NO GROWTH 4 DAYS Performed at [facility]    Report Status PENDING  Incomplete  Culture, blood (x 2)      Status: None (Preliminary result)   Collection Time: 05/19/16  9:01 AM  Result Value Ref Range Status   Specimen Description BLOOD RIGHT ANTECUBITAL  Final   Special Requests BOTTLES DRAWN AEROBIC AND ANAEROBIC 5CC  Final   Culture   Final    NO GROWTH 4 DAYS Performed at [facility]    Report Status PENDING  Incomplete     Labs: BNP (last 3 results) No results for input(s): BNP in the last 8760 hours. Basic Metabolic Panel:  Recent Labs Lab 05/19/16 0901  NA 136  K 3.9  CL 102  CO2 25  GLUCOSE 118*  BUN 11  CREATININE 0.92  CALCIUM 9.3   Liver Function Tests:  Recent Labs Lab 05/19/16 0901  AST 15  ALT 11*  ALKPHOS 46  BILITOT 0.9  PROT 7.0  ALBUMIN 3.2*   No results for input(s): LIPASE, AMYLASE in the last 168 hours. No results for input(s): AMMONIA in the last 168 hours. CBC:  Recent Labs Lab 05/19/16 0901 05/20/16 0605  WBC 7.5 5.5  NEUTROABS 4.7  --   HGB 11.4* 10.9*  HCT 32.9* 31.8*  MCV 85.7 85.7  PLT 212 219   Cardiac Enzymes:  Recent Labs Lab 05/22/16 0548  TROPONINI <0.03   BNP: Invalid input(s): POCBNP CBG: No results for input(s): GLUCAP in the last 168 hours. D-Dimer No results for input(s): DDIMER in the last 72 hours. Hgb A1c No results for input(s): HGBA1C in the last 72 hours. Lipid Profile No results for input(s): CHOL, HDL, LDLCALC, TRIG, CHOLHDL, LDLDIRECT in the last 72 hours. Thyroid function studies No results for input(s): TSH, T4TOTAL, T3FREE, THYROIDAB in the last 72 hours.  Invalid input(s): FREET3 Anemia work up No results for input(s): VITAMINB12, FOLATE, FERRITIN, TIBC, IRON, RETICCTPCT in the last 72 hours. Urinalysis    Component Value Date/Time   COLORURINE YELLOW 05/12/2016 1251   APPEARANCEUR CLOUDY (A) 05/12/2016 1251   LABSPEC 1.013 05/12/2016 1251   PHURINE 6.0 05/12/2016 1251   GLUCOSEU NEGATIVE 05/12/2016 1251   HGBUR TRACE (A) 05/12/2016 1251   BILIRUBINUR NEGATIVE 05/12/2016 1251    KETONESUR NEGATIVE 05/12/2016 1251   PROTEINUR 100 (A) 05/12/2016 1251   UROBILINOGEN 1.0 09/27/2011 0614   NITRITE POSITIVE (A) 05/12/2016 1251   LEUKOCYTESUR MODERATE (A) 05/12/2016 1251   Sepsis Labs Invalid input(s): PROCALCITONIN,  WBC,  LACTICIDVEN Microbiology Recent Results (from the past 240 hour(s))  MRSA PCR Screening     Status: None   Collection Time: 05/14/16  5:45 PM  Result Value Ref Range Status   MRSA  by PCR NEGATIVE NEGATIVE Final    Comment:        The GeneXpert MRSA Assay (FDA approved for NASAL specimens only), is one component of a comprehensive MRSA colonization surveillance program. It is not intended to diagnose MRSA infection nor to guide or monitor treatment for MRSA infections.   Culture, blood (x 2)     Status: None (Preliminary result)   Collection Time: 05/19/16  9:01 AM  Result Value Ref Range Status   Specimen Description BLOOD RIGHT HAND  Final   Special Requests BOTTLES DRAWN AEROBIC AND ANAEROBIC 10CC  Final   Culture   Final    NO GROWTH 4 DAYS Performed at [facility]    Report Status PENDING  Incomplete  Culture, blood (x 2)     Status: None (Preliminary result)   Collection Time: 05/19/16  9:01 AM  Result Value Ref Range Status   Specimen Description BLOOD RIGHT ANTECUBITAL  Final   Special Requests BOTTLES DRAWN AEROBIC AND ANAEROBIC 5CC  Final   Culture   Final    NO GROWTH 4 DAYS Performed at [facility]    Report Status PENDING  Incomplete     Time coordinating discharge: Over 30 minutes  SIGNED:   Eddie NorthHUNGEL, Ebba Goll, MD  [facility] 05/24/2016, 12:03 PM Pager   If 7PM-7AM, please contact night-coverage [email or web address] Password [email or web address]

## 2016-05-24 NOTE — Discharge Instructions (Signed)

## 2016-06-02 DIAGNOSIS — R109 Unspecified abdominal pain: Secondary | ICD-10-CM | POA: Diagnosis not present

## 2016-06-02 DIAGNOSIS — F29 Unspecified psychosis not due to a substance or known physiological condition: Secondary | ICD-10-CM | POA: Diagnosis not present

## 2016-06-02 DIAGNOSIS — G0439 Other acute necrotizing hemorrhagic encephalopathy: Secondary | ICD-10-CM | POA: Diagnosis not present

## 2016-06-02 DIAGNOSIS — F10239 Alcohol dependence with withdrawal, unspecified: Secondary | ICD-10-CM | POA: Diagnosis not present

## 2016-06-02 DIAGNOSIS — M6281 Muscle weakness (generalized): Secondary | ICD-10-CM | POA: Diagnosis not present

## 2016-06-02 DIAGNOSIS — N390 Urinary tract infection, site not specified: Secondary | ICD-10-CM | POA: Diagnosis not present

## 2016-06-02 DIAGNOSIS — F419 Anxiety disorder, unspecified: Secondary | ICD-10-CM | POA: Diagnosis not present

## 2016-06-02 DIAGNOSIS — R2681 Unsteadiness on feet: Secondary | ICD-10-CM | POA: Diagnosis not present

## 2016-06-02 DIAGNOSIS — I10 Essential (primary) hypertension: Secondary | ICD-10-CM | POA: Diagnosis not present

## 2016-06-02 DIAGNOSIS — G4089 Other seizures: Secondary | ICD-10-CM | POA: Diagnosis not present

## 2016-06-02 DIAGNOSIS — K8521 Alcohol induced acute pancreatitis with uninfected necrosis: Secondary | ICD-10-CM | POA: Diagnosis not present

## 2016-06-16 DIAGNOSIS — F17210 Nicotine dependence, cigarettes, uncomplicated: Secondary | ICD-10-CM | POA: Diagnosis not present

## 2016-06-16 DIAGNOSIS — G4089 Other seizures: Secondary | ICD-10-CM | POA: Diagnosis not present

## 2016-06-16 DIAGNOSIS — D519 Vitamin B12 deficiency anemia, unspecified: Secondary | ICD-10-CM | POA: Diagnosis not present

## 2016-06-16 DIAGNOSIS — S069X0S Unspecified intracranial injury without loss of consciousness, sequela: Secondary | ICD-10-CM | POA: Diagnosis not present

## 2016-06-16 DIAGNOSIS — M21371 Foot drop, right foot: Secondary | ICD-10-CM | POA: Diagnosis not present

## 2016-06-16 DIAGNOSIS — R262 Difficulty in walking, not elsewhere classified: Secondary | ICD-10-CM | POA: Diagnosis not present

## 2016-06-16 DIAGNOSIS — I10 Essential (primary) hypertension: Secondary | ICD-10-CM | POA: Diagnosis not present

## 2016-06-16 DIAGNOSIS — R413 Other amnesia: Secondary | ICD-10-CM | POA: Diagnosis not present

## 2016-06-16 DIAGNOSIS — F1020 Alcohol dependence, uncomplicated: Secondary | ICD-10-CM | POA: Diagnosis not present

## 2016-07-17 DIAGNOSIS — R1011 Right upper quadrant pain: Secondary | ICD-10-CM | POA: Diagnosis not present

## 2016-07-18 ENCOUNTER — Emergency Department (HOSPITAL_COMMUNITY): Payer: Commercial Managed Care - HMO

## 2016-07-18 ENCOUNTER — Emergency Department (HOSPITAL_COMMUNITY)
Admission: EM | Admit: 2016-07-18 | Discharge: 2016-07-18 | Disposition: A | Discharge status: Home / Self Care | Payer: Commercial Managed Care - HMO | Attending: Emergency Medicine | Admitting: Emergency Medicine

## 2016-07-18 ENCOUNTER — Encounter (HOSPITAL_COMMUNITY): Payer: Self-pay | Admitting: *Deleted

## 2016-07-18 DIAGNOSIS — R0600 Dyspnea, unspecified: Secondary | ICD-10-CM

## 2016-07-18 DIAGNOSIS — I5032 Chronic diastolic (congestive) heart failure: Secondary | ICD-10-CM | POA: Diagnosis not present

## 2016-07-18 DIAGNOSIS — F419 Anxiety disorder, unspecified: Secondary | ICD-10-CM | POA: Insufficient documentation

## 2016-07-18 DIAGNOSIS — F410 Panic disorder [episodic paroxysmal anxiety] without agoraphobia: Secondary | ICD-10-CM

## 2016-07-18 DIAGNOSIS — Z79899 Other long term (current) drug therapy: Secondary | ICD-10-CM | POA: Diagnosis not present

## 2016-07-18 DIAGNOSIS — I110 Hypertensive heart disease with heart failure: Secondary | ICD-10-CM | POA: Insufficient documentation

## 2016-07-18 DIAGNOSIS — F17210 Nicotine dependence, cigarettes, uncomplicated: Secondary | ICD-10-CM | POA: Diagnosis not present

## 2016-07-18 DIAGNOSIS — F10930 Alcohol use, unspecified with withdrawal, uncomplicated: Secondary | ICD-10-CM

## 2016-07-18 DIAGNOSIS — R0602 Shortness of breath: Secondary | ICD-10-CM | POA: Insufficient documentation

## 2016-07-18 DIAGNOSIS — F10230 Alcohol dependence with withdrawal, uncomplicated: Secondary | ICD-10-CM | POA: Diagnosis not present

## 2016-07-18 DIAGNOSIS — R079 Chest pain, unspecified: Secondary | ICD-10-CM | POA: Diagnosis not present

## 2016-07-18 DIAGNOSIS — Y902 Blood alcohol level of 40-59 mg/100 ml: Secondary | ICD-10-CM | POA: Diagnosis not present

## 2016-07-18 LAB — CBC WITH DIFFERENTIAL/PLATELET
BASOS PCT: 1 %
Basophils Absolute: 0.1 10*3/uL (ref 0.0–0.1)
EOS ABS: 0.1 10*3/uL (ref 0.0–0.7)
EOS PCT: 1 %
HCT: 32.7 % — ABNORMAL LOW (ref 39.0–52.0)
HEMOGLOBIN: 11 g/dL — AB (ref 13.0–17.0)
Lymphocytes Relative: 27 %
Lymphs Abs: 2.4 10*3/uL (ref 0.7–4.0)
MCH: 27.9 pg (ref 26.0–34.0)
MCHC: 33.6 g/dL (ref 30.0–36.0)
MCV: 83 fL (ref 78.0–100.0)
MONO ABS: 1 10*3/uL (ref 0.1–1.0)
Monocytes Relative: 11 %
NEUTROS ABS: 5.2 10*3/uL (ref 1.7–7.7)
NEUTROS PCT: 60 %
Platelets: 228 10*3/uL (ref 150–400)
RBC: 3.94 MIL/uL — ABNORMAL LOW (ref 4.22–5.81)
RDW: 14.3 % (ref 11.5–15.5)
WBC: 8.8 10*3/uL (ref 4.0–10.5)

## 2016-07-18 LAB — COMPREHENSIVE METABOLIC PANEL
ALBUMIN: 4.3 g/dL (ref 3.5–5.0)
ALK PHOS: 62 U/L (ref 38–126)
ALT: 18 U/L (ref 17–63)
AST: 38 U/L (ref 15–41)
Anion gap: 15 (ref 5–15)
BUN: 24 mg/dL — ABNORMAL HIGH (ref 6–20)
CALCIUM: 9.2 mg/dL (ref 8.9–10.3)
CHLORIDE: 105 mmol/L (ref 101–111)
CO2: 20 mmol/L — AB (ref 22–32)
CREATININE: 1.2 mg/dL (ref 0.61–1.24)
GFR calc Af Amer: >60 mL/min (ref >60)
GFR calc non Af Amer: >60 mL/min (ref >60)
GLUCOSE: 77 mg/dL (ref 65–99)
Potassium: 3.8 mmol/L (ref 3.5–5.1)
SODIUM: 140 mmol/L (ref 135–145)
Total Bilirubin: 0.9 mg/dL (ref 0.3–1.2)
Total Protein: 7.4 g/dL (ref 6.5–8.1)

## 2016-07-18 LAB — ETHANOL: ALCOHOL ETHYL (B): 53 mg/dL — AB (ref <5)

## 2016-07-18 MED ORDER — ALBUTEROL SULFATE (2.5 MG/3ML) 0.083% IN NEBU: 5.0000 mg | INHALATION_SOLUTION | Freq: Once | RESPIRATORY_TRACT | Status: DC

## 2016-07-18 MED ORDER — LORAZEPAM 2 MG/ML IJ SOLN: 1.0000 mg | Freq: Once | INTRAMUSCULAR | Status: DC

## 2016-07-18 MED ORDER — CHLORDIAZEPOXIDE HCL 25 MG PO CAPS: ORAL_CAPSULE | ORAL | 0 refills | Status: AC

## 2016-07-18 MED ORDER — IOPAMIDOL (ISOVUE-370) INJECTION 76%
100.0000 mL | Freq: Once | INTRAVENOUS | Status: AC | PRN
  Administered 2016-07-18: 100 mL via INTRAVENOUS

## 2016-07-18 MED ORDER — LORAZEPAM 2 MG/ML IJ SOLN
2.0000 mg | Freq: Once | INTRAMUSCULAR | Status: AC
  Administered 2016-07-18: 2 mg via INTRAVENOUS
  Filled 2016-07-18: qty 1

## 2016-07-18 NOTE — Discharge Instructions (Signed)
Librium as prescribed.  Follow-up with your primary Dr. in the next week, and return to the ER symptoms significantly worsen or change.

## 2016-07-18 NOTE — ED Provider Notes (Signed)
[facility]-EMERGENCY DEPT Provider Note   CSN: [id number] Arrival date & time: 07/18/16  0029     History   Chief Complaint Chief Complaint  Patient presents with  . Shortness of Breath  . Anxiety    HPI Troy Torres is a 65 y.o. male.  Patient is a 65 year old male with past medical history of chronic alcoholism, seizures, iron deficiency, and esophagitis. All of his medical issues appear to be related to his alcoholism. He presents today with complaints of anxiety and jitteriness. He also feels short of breath and is complaining of pain to the right side of his chest. He was recently admitted with a similar presentation. He was diagnosed with alcohol withdrawal. He also has a history of DVTs.   The history is provided by the patient.  Shortness of Breath  This is a new problem. The average episode lasts 2 days. The problem occurs continuously.The current episode started 2 days ago. The problem has been rapidly worsening. Associated symptoms include chest pain. Pertinent negatives include no fever, no cough and no sputum production. He has tried nothing for the symptoms.  Anxiety  Associated symptoms include chest pain and shortness of breath.    Past Medical History:  Diagnosis Date  . Alcohol abuse    with history of DTs // states last drink was 10/2012  . Angiodysplasia of cecum 10/2011   3 medium sized angiodysplastic lesions per colonoscopy (Dr. Madilyn Fireman)  . Antral ulcer 10/2011   Small ulcer noted per Endoscopy (Dr. Madilyn Fireman)  . Chronic diastolic heart failure (HCC)    grade 2 per 2D echo (12/2012)  . Esophagitis 10/2011   per EGD (10/2011)  . History of blood product transfusion ~ 2014   LGIB  . Intracerebral bleed due to trauma [facility]) 12/2006   History of fall in 2008 with resultant left frontal anteriocerebral hemorrhage and bifrontal subdural hematomas and SAH // Started on seizure prophylaxis at this time  . Iron deficiency anemia due to chronic blood loss    BL Hgb  12-13  . Lower GI bleeding 2014  . Seizure [facility])    generalized major motor seizures // Started after Head injury 2008 // [facility]ed [facility] by Dr. Sandria Manly  . Tobacco abuse     Patient Active Problem List   Diagnosis Date Noted  . B12 deficiency 05/24/2016  . Essential hypertension, benign 05/24/2016  . Hypomagnesemia   . UTI (lower urinary tract infection) 05/12/2016  . Acute encephalopathy 05/12/2016  . Abdominal pain 05/12/2016  . GERD (gastroesophageal reflux disease) 05/12/2016  . 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 Alcohol withdrawal delirium, acute, hyperactive (HCC) 05/12/2016  . Upper GI bleed 03/11/2015  . Acute GI bleeding 03/11/2015  . Acute blood loss anemia 03/11/2015  . Essential hypertension 03/11/2015  . Hypertension 01/29/2013  . Chronic diastolic heart failure (HCC)   . Pre-syncope 01/07/2013  . Iron deficiency anemia due to chronic blood loss   . Gastritis without bleeding 12/19/2011  . Gastric ulcer 12/19/2011  . Tobacco abuse 10/26/2011  . History of seizure disorder 09/26/2011    Past Surgical History:  Procedure Laterality Date  . CATARACT EXTRACTION W/ INTRAOCULAR LENS  IMPLANT, BILATERAL Bilateral ~ 2014  . ESOPHAGOGASTRODUODENOSCOPY N/A 03/11/2015   Procedure: ESOPHAGOGASTRODUODENOSCOPY (EGD);  Surgeon: Vida Rigger, MD;  Location[facility] ENDOSCOPY;  Service: Endoscopy;  Laterality: N/A;       Home Medications    Prior to Admission medications   Medication Sig Start Date End Date Taking? Authorizing Provider  amLODipine (NORVASC) 5 MG tablet Take 1 tablet (5 mg total) by mouth daily. 05/24/16   Nishant Dhungel, MD  feeding supplement, ENSURE ENLIVE, (ENSURE ENLIVE) LIQD Take 237 mLs by mouth 2 (two) times daily between meals. 05/24/16   Nishant Dhungel, MD  folic acid (FOLVITE) 1 MG tablet Take 1 tablet (1 mg total) by mouth daily. 05/24/16   Nishant Dhungel, MD  LORazepam (ATIVAN) 1 MG tablet Take 1 tablet (1 mg total) by mouth every 8  (eight) hours as needed for anxiety (alcohol withdrawal symptoms). 05/24/16   Nishant Dhungel, MD  Multiple Vitamin (MULTIVITAMIN WITH MINERALS) TABS tablet Take 1 tablet by mouth daily. 05/24/16   Nishant Dhungel, MD  nicotine (NICODERM CQ - DOSED IN MG/24 HOURS) 14 mg/24hr patch Place 1 patch (14 mg total) onto the skin daily. 05/24/16   Nishant Dhungel, MD  omeprazole (PRILOSEC) 20 MG capsule Take 1 capsule (20 mg total) by mouth daily. 05/24/16   Nishant Dhungel, MD  QUEtiapine (SEROQUEL) 50 MG tablet Take 1 tablet (50 mg total) by mouth at bedtime. 05/24/16   Nishant Dhungel, MD  thiamine 100 MG tablet Take 1 tablet (100 mg total) by mouth daily. 05/24/16   Nishant Dhungel, MD  vitamin B-12 1000 MCG tablet Take 1 tablet (1,000 mcg total) by mouth daily. 05/24/16   Eddie North, MD    Family History Family History  Problem Relation Age of Onset  . Diabetes Mother 81    deceased  . Diabetes Sister   . Heart attack Father 20    deceased  . Heart disease Sister 65    Social History Social History  Substance Use Topics  . Smoking status: Current Every Day Smoker    Packs/day: 1.00    Years: 46.00    Types: Cigarettes  . Smokeless tobacco: Never Used  . Alcohol use 6.0 oz/week    10 Shots of liquor per week     Comment: 03/11/2015 pt states he quit 10/2012; son states he's "drinking 1/2 gallon of alcohol, vodka, per 3 weeks"     Allergies   Review of patient's allergies indicates no known allergies.   Review of Systems Review of Systems  Constitutional: Negative for fever.  Respiratory: Positive for shortness of breath. Negative for cough and sputum production.   Cardiovascular: Positive for chest pain.  All other systems reviewed and are negative.    Physical Exam Updated Vital Signs BP 137/90 (BP Location: Left Arm)   Pulse 79   Temp 98.8 F (37.1 C) (Oral)   Resp 26   Ht 5\' 6"  (1.676 m)   Wt 140 lb (63.5 kg)   SpO2 95%   BMI 22.60 kg/m   Physical Exam    Constitutional: He is oriented to person, place, and time. He appears well-developed and well-nourished. No distress.  Patient appears extremely anxious and tremulous.  HENT:  Head: Normocephalic and atraumatic.  Mouth/Throat: Oropharynx is clear and moist.  Neck: Normal range of motion. Neck supple.  Cardiovascular: Normal rate and regular rhythm.  Exam reveals no friction rub.   No murmur heard. Pulmonary/Chest: Effort normal and breath sounds normal. No respiratory distress. He has no wheezes. He has no rales.  Abdominal: Soft. Bowel sounds are normal. He exhibits no distension. There is no tenderness.  Musculoskeletal: Normal range of motion. He exhibits no edema.  Neurological: He is alert and oriented to person, place, and time. Coordination normal.  Patient has a tremor noted. He appears extremely anxious and restless.  He does move all extremities. He responds to questions appropriately.  Skin: Skin is warm and dry. He is not diaphoretic.  Nursing note and vitals reviewed.    ED Treatments / Results  Labs (all labs ordered are listed, but only abnormal results are displayed) Labs Reviewed  COMPREHENSIVE METABOLIC PANEL  CBC WITH DIFFERENTIAL/PLATELET  ETHANOL  Rosezena SensorI-STAT TROPOININ, ED    EKG  EKG Interpretation  Date/Time:  Tuesday July 18 2016 00:49:59 EDT Ventricular Rate:  91 PR Interval:    QRS Duration: 83 QT Interval:  358 QTC Calculation: 441 R Axis:   66 Text Interpretation:  Sinus rhythm Anteroseptal infarct, old Confirmed by Ruvim Risko  MD, Shebra Muldrow ([id number]) on 07/18/2016 1:41:55 AM       Radiology Dg Chest 2 View  Result Date: 07/18/2016 CLINICAL DATA:  Dyspnea EXAM: CHEST  2 VIEW COMPARISON:  05/19/2016 FINDINGS: The heart size and mediastinal contours are within normal limits. Both lungs are clear. No pneumothorax, pulmonary consolidation or effusion. The visualized skeletal structures are unremarkable. IMPRESSION: No active cardiopulmonary disease.  Electronically Signed   By: Tollie Ethavid  Kwon M.D.   On: 07/18/2016 01:31    Procedures Procedures (including critical care time)  Medications Ordered in ED Medications  albuterol (PROVENTIL) (2.5 MG/3ML) 0.083% nebulizer solution 5 mg (0 mg Nebulization Hold 07/18/16 0053)  LORazepam (ATIVAN) injection 2 mg (not administered)     Initial Impression / Assessment and Plan / ED Course  I have reviewed the triage vital signs and the nursing notes.  Pertinent labs & imaging results that were available during my care of the patient were reviewed by me and considered in my medical decision making (see chart for details).  Clinical Course    Patient presents here with complaints of jitteriness, shakiness, anxiety, shortness of breath, and right-sided chest pain. This started several hours before arrival. He was recently admitted for what sounds like alcohol withdrawal with similar symptoms. Today's workup reveals no acute abnormality. His EKG does not reveal a cardiac etiology and troponin is negative. CT of the chest reveals no evidence for pulmonary embolism or other pathology. He was given 2 mg of Ativan and his shakiness has resolved. I see no indication for admission and believe he is appropriate for discharge. He will be given Librium to help with alcohol withdrawal and is advised to follow-up with his primary doctor.  Final Clinical Impressions(s) / ED Diagnoses   Final diagnoses:  None    New Prescriptions New Prescriptions   No medications on file     Geoffery Lyonsouglas Margaretta Chittum, MD 07/18/16 [phone]

## 2016-07-18 NOTE — ED Triage Notes (Signed)
Pt BIB [facility] after having RUQ pain similar to his 'cirrohsis' pain. Pt also states that he has anxiety when this happens. ETOH. Alert, oriented, ambulatory.

## 2016-07-18 NOTE — ED Notes (Signed)
Patient transported to X-ray 

## 2016-07-18 NOTE — ED Triage Notes (Signed)
Pt's friend reports SOB and R cp x 2 days.  Pt has hx of anxiety, is actively shaking.  Pt is A&Ox 4.

## 2016-07-21 DIAGNOSIS — I10 Essential (primary) hypertension: Secondary | ICD-10-CM | POA: Diagnosis not present

## 2016-07-21 DIAGNOSIS — S069X0S Unspecified intracranial injury without loss of consciousness, sequela: Secondary | ICD-10-CM | POA: Diagnosis not present

## 2016-07-21 DIAGNOSIS — F17210 Nicotine dependence, cigarettes, uncomplicated: Secondary | ICD-10-CM | POA: Diagnosis not present

## 2016-07-21 DIAGNOSIS — F1020 Alcohol dependence, uncomplicated: Secondary | ICD-10-CM | POA: Diagnosis not present

## 2016-07-21 DIAGNOSIS — R262 Difficulty in walking, not elsewhere classified: Secondary | ICD-10-CM | POA: Diagnosis not present

## 2016-07-21 DIAGNOSIS — R413 Other amnesia: Secondary | ICD-10-CM | POA: Diagnosis not present

## 2016-07-21 DIAGNOSIS — G4089 Other seizures: Secondary | ICD-10-CM | POA: Diagnosis not present

## 2016-07-21 DIAGNOSIS — M21371 Foot drop, right foot: Secondary | ICD-10-CM | POA: Diagnosis not present

## 2016-07-21 DIAGNOSIS — D519 Vitamin B12 deficiency anemia, unspecified: Secondary | ICD-10-CM | POA: Diagnosis not present

## 2016-07-25 DIAGNOSIS — G4089 Other seizures: Secondary | ICD-10-CM | POA: Diagnosis not present

## 2016-07-25 DIAGNOSIS — F17210 Nicotine dependence, cigarettes, uncomplicated: Secondary | ICD-10-CM | POA: Diagnosis not present

## 2016-07-25 DIAGNOSIS — R413 Other amnesia: Secondary | ICD-10-CM | POA: Diagnosis not present

## 2016-07-25 DIAGNOSIS — I10 Essential (primary) hypertension: Secondary | ICD-10-CM | POA: Diagnosis not present

## 2016-07-25 DIAGNOSIS — F1020 Alcohol dependence, uncomplicated: Secondary | ICD-10-CM | POA: Diagnosis not present

## 2016-07-25 DIAGNOSIS — S069X0S Unspecified intracranial injury without loss of consciousness, sequela: Secondary | ICD-10-CM | POA: Diagnosis not present

## 2016-07-25 DIAGNOSIS — D519 Vitamin B12 deficiency anemia, unspecified: Secondary | ICD-10-CM | POA: Diagnosis not present

## 2016-07-25 DIAGNOSIS — M21371 Foot drop, right foot: Secondary | ICD-10-CM | POA: Diagnosis not present

## 2016-07-25 DIAGNOSIS — R262 Difficulty in walking, not elsewhere classified: Secondary | ICD-10-CM | POA: Diagnosis not present

## 2016-07-29 ENCOUNTER — Encounter (HOSPITAL_COMMUNITY): Payer: Self-pay | Admitting: Emergency Medicine

## 2016-07-29 ENCOUNTER — Emergency Department (HOSPITAL_COMMUNITY)
Admission: EM | Admit: 2016-07-29 | Discharge: 2016-07-29 | Disposition: A | Discharge status: Home / Self Care | Payer: Commercial Managed Care - HMO | Attending: Emergency Medicine | Admitting: Emergency Medicine

## 2016-07-29 DIAGNOSIS — I10 Essential (primary) hypertension: Secondary | ICD-10-CM | POA: Insufficient documentation

## 2016-07-29 DIAGNOSIS — R109 Unspecified abdominal pain: Secondary | ICD-10-CM | POA: Diagnosis not present

## 2016-07-29 HISTORY — DX: Anxiety disorder, unspecified: F41.9

## 2016-07-29 HISTORY — DX: Essential (primary) hypertension: I10

## 2016-07-29 NOTE — ED Provider Notes (Signed)
[facility]-EMERGENCY DEPT Provider Note   CSN: [id number] Arrival date & time: 07/29/16  1803     History   Chief Complaint Chief Complaint  Patient presents with  . Abdominal Pain    HPI Troy Torres is a 65 y.o. male.  The history is provided by the patient. No language interpreter was used.  Abdominal Pain     Troy Torres is a 65 y.o. male who presents to the Emergency Department complaining of right side pain.  He reports two minutes of right side pain earlier today.  Pain is completely resolved on ED arrival.  He denies any fever, chest pain, abdominal pain, nausea, vomiting, diarrhea, dysuria.  He is requesting to go home.  He denies any recent injuries.    Past Medical History:  Diagnosis Date  . Anxiety   . Hypertension     There are no active problems to display for this patient.   No past surgical history on file.     Home Medications    Prior to Admission medications   Not on File    Family History No family history on file.  Social History Social History  Substance Use Topics  . Smoking status: Not on file  . Smokeless tobacco: Not on file  . Alcohol use Not on file     Allergies   Review of patient's allergies indicates not on file.   Review of Systems Review of Systems  Gastrointestinal: Positive for abdominal pain.  All other systems reviewed and are negative.    Physical Exam Updated Vital Signs BP 134/84 (BP Location: Right Arm)   Pulse 95   Temp 98.2 F (36.8 C) (Oral)   Resp 16   SpO2 96%   Physical Exam  Constitutional: He is oriented to person, place, and time. He appears well-developed and well-nourished.  HENT:  Head: Normocephalic and atraumatic.  Cardiovascular: Normal rate and regular rhythm.   No murmur heard. Pulmonary/Chest: Effort normal and breath sounds normal. No respiratory distress.  Abdominal: Soft. There is no tenderness. There is no rebound and no guarding.  Musculoskeletal: He exhibits no edema or  tenderness.  Neurological: He is alert and oriented to person, place, and time.  Skin: Skin is warm and dry.  Psychiatric: He has a normal mood and affect. His behavior is normal.  Nursing note and vitals reviewed.    ED Treatments / Results  Labs (all labs ordered are listed, but only abnormal results are displayed) Labs Reviewed - No data to display  EKG  EKG Interpretation None       Radiology No results found.  Procedures Procedures (including critical care time)  Medications Ordered in ED Medications - No data to display   Initial Impression / Assessment and Plan / ED Course  I have reviewed the triage vital signs and the nursing notes.  Pertinent labs & imaging results that were available during my care of the patient were reviewed by me and considered in my medical decision making (see chart for details).  Clinical Course    Pt here for episode of right flank pain, now resolved.  He declines any work up in the Emergency Department.  He is in no distress on exam with no reproducible back, chest wall or abdominal tenderness.  Discussed with patient home care for flank pain as well as close return precautions.    Final Clinical Impressions(s) / ED Diagnoses   Final diagnoses:  Acute right flank pain    New Prescriptions New  Prescriptions   No medications on file     Tilden FossaElizabeth Abigaelle Verley, MD 07/30/16 1348

## 2016-07-29 NOTE — ED Triage Notes (Signed)
Patient from home ambulatory with left lower quadrant abdominal pain which started five hours ago.  He also reports drinking about a cup of Vodka.  Patient also reports some diarrhea but not other symptoms.  History of anxiety and hypertension.

## 2016-07-30 ENCOUNTER — Encounter (HOSPITAL_COMMUNITY): Payer: Self-pay | Admitting: *Deleted

## 2016-07-30 ENCOUNTER — Emergency Department (HOSPITAL_COMMUNITY)
Admission: EM | Admit: 2016-07-30 | Discharge: 2016-07-30 | Discharge status: Against Medical Advice | Payer: Commercial Managed Care - HMO | Attending: Emergency Medicine | Admitting: Emergency Medicine

## 2016-07-30 ENCOUNTER — Encounter (HOSPITAL_COMMUNITY): Payer: Self-pay | Admitting: Emergency Medicine

## 2016-07-30 ENCOUNTER — Emergency Department (HOSPITAL_COMMUNITY): Payer: Commercial Managed Care - HMO

## 2016-07-30 DIAGNOSIS — Z79899 Other long term (current) drug therapy: Secondary | ICD-10-CM | POA: Insufficient documentation

## 2016-07-30 DIAGNOSIS — R1030 Lower abdominal pain, unspecified: Secondary | ICD-10-CM | POA: Diagnosis not present

## 2016-07-30 DIAGNOSIS — I5032 Chronic diastolic (congestive) heart failure: Secondary | ICD-10-CM | POA: Insufficient documentation

## 2016-07-30 DIAGNOSIS — R109 Unspecified abdominal pain: Secondary | ICD-10-CM | POA: Diagnosis not present

## 2016-07-30 DIAGNOSIS — R1031 Right lower quadrant pain: Secondary | ICD-10-CM | POA: Diagnosis not present

## 2016-07-30 DIAGNOSIS — I110 Hypertensive heart disease with heart failure: Secondary | ICD-10-CM | POA: Diagnosis not present

## 2016-07-30 DIAGNOSIS — R10A0 Flank pain, unspecified side: Secondary | ICD-10-CM

## 2016-07-30 LAB — CBC WITH DIFFERENTIAL/PLATELET
Basophils Absolute: 0 10*3/uL (ref 0.0–0.1)
Basophils Relative: 1 %
Eosinophils Absolute: 0 10*3/uL (ref 0.0–0.7)
Eosinophils Relative: 0 %
HCT: 35.7 % — ABNORMAL LOW (ref 39.0–52.0)
Hemoglobin: 11.9 g/dL — ABNORMAL LOW (ref 13.0–17.0)
Lymphocytes Relative: 30 %
Lymphs Abs: 1.1 10*3/uL (ref 0.7–4.0)
MCH: 27.9 pg (ref 26.0–34.0)
MCHC: 33.3 g/dL (ref 30.0–36.0)
MCV: 83.6 fL (ref 78.0–100.0)
Monocytes Absolute: 0.3 10*3/uL (ref 0.1–1.0)
Monocytes Relative: 7 %
Neutro Abs: 2.4 10*3/uL (ref 1.7–7.7)
Neutrophils Relative %: 62 %
Platelets: 201 10*3/uL (ref 150–400)
RBC: 4.27 MIL/uL (ref 4.22–5.81)
RDW: 15 % (ref 11.5–15.5)
WBC: 3.9 10*3/uL — ABNORMAL LOW (ref 4.0–10.5)

## 2016-07-30 LAB — URINALYSIS, ROUTINE W REFLEX MICROSCOPIC
BILIRUBIN URINE: NEGATIVE
Glucose, UA: NEGATIVE mg/dL
KETONES UR: NEGATIVE mg/dL
Leukocytes, UA: NEGATIVE
NITRITE: NEGATIVE
PH: 6 (ref 5.0–8.0)
Protein, ur: 100 mg/dL — AB
SPECIFIC GRAVITY, URINE: 1.016 (ref 1.005–1.030)

## 2016-07-30 LAB — COMPREHENSIVE METABOLIC PANEL
ALK PHOS: 62 U/L (ref 38–126)
ALT: 26 U/L (ref 17–63)
ANION GAP: 15 (ref 5–15)
AST: 53 U/L — ABNORMAL HIGH (ref 15–41)
Albumin: 4.2 g/dL (ref 3.5–5.0)
BILIRUBIN TOTAL: 0.4 mg/dL (ref 0.3–1.2)
BUN: 23 mg/dL — ABNORMAL HIGH (ref 6–20)
CALCIUM: 8.8 mg/dL — AB (ref 8.9–10.3)
CO2: 23 mmol/L (ref 22–32)
Chloride: 103 mmol/L (ref 101–111)
Creatinine, Ser: 1.27 mg/dL — ABNORMAL HIGH (ref 0.61–1.24)
GFR calc non Af Amer: 58 mL/min — ABNORMAL LOW (ref >60)
Glucose, Bld: 86 mg/dL (ref 65–99)
POTASSIUM: 4.1 mmol/L (ref 3.5–5.1)
SODIUM: 141 mmol/L (ref 135–145)
TOTAL PROTEIN: 7.3 g/dL (ref 6.5–8.1)

## 2016-07-30 LAB — LIPASE, BLOOD: Lipase: 89 U/L — ABNORMAL HIGH (ref 11–51)

## 2016-07-30 LAB — URINE MICROSCOPIC-ADD ON

## 2016-07-30 NOTE — ED Provider Notes (Signed)
[facility]-EMER[facility]Y DEPT Provider Note   CSN: [id number] Arrival date & time: 07/30/16  1736     History   Chief Complaint Chief Complaint  Patient presents with  . Flank Pain    HPI Troy Torres is a 65 y.o. male.  The history is provided by the patient. No language interpreter was used.  Flank Pain     Troy Torres is a 65 y.o. male who presents to the Emergency Department complaining of flank pain.  He reports right sided flank pain that began while watching Joni Fears. Pain was immediate in onset and constant. It is improving now but he does have some mild persistent pain. He has associated nausea and dysuria. No vomiting, fevers, cough, shortness of breath, chest pain. He had an episode yesterday that resolved without intervention.    Past Medical History:  Diagnosis Date  . Alcohol abuse    with history of DTs // states last drink was 10/2012  . Angiodysplasia of cecum 10/2011   3 medium sized angiodysplastic lesions per colonoscopy (Dr. Madilyn Fireman)  . Antral ulcer 10/2011   Small ulcer noted per Endoscopy (Dr. Madilyn Fireman)  . Anxiety   . Chronic diastolic heart failure (HCC)    grade 2 per 2D echo (12/2012)  . Esophagitis 10/2011   per EGD (10/2011)  . History of blood product transfusion ~ 2014   LGIB  . Hypertension   . Intracerebral bleed due to trauma [facility]) 12/2006   History of fall in 2008 with resultant left frontal anteriocerebral hemorrhage and bifrontal subdural hematomas and SAH // Started on seizure prophylaxis at this time  . Iron deficiency anemia due to chronic blood loss    BL Hgb 12-13  . Lower GI bleeding 2014  . Seizure [facility])    generalized major motor seizures // Started after Head injury 2008 // Followed at [facility] by Dr. Sandria Manly  . Tobacco abuse     Patient Active Problem List   Diagnosis Date Noted  . B12 deficiency 05/24/2016  . Essential hypertension, benign 05/24/2016  . Hypomagnesemia   . UTI (lower urinary tract infection)  05/12/2016  . Acute encephalopathy 05/12/2016  . Abdominal pain 05/12/2016  . GERD (gastroesophageal reflux disease) 05/12/2016  . 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 Alcohol withdrawal delirium, acute, hyperactive (HCC) 05/12/2016  . Upper GI bleed 03/11/2015  . Acute GI bleeding 03/11/2015  . Acute blood loss anemia 03/11/2015  . Essential hypertension 03/11/2015  . Hypertension 01/29/2013  . Chronic diastolic heart failure (HCC)   . Pre-syncope 01/07/2013  . Iron deficiency anemia due to chronic blood loss   . Gastritis without bleeding 12/19/2011  . Gastric ulcer 12/19/2011  . Tobacco abuse 10/26/2011  . History of seizure disorder 09/26/2011    Past Surgical History:  Procedure Laterality Date  . CATARACT EXTRACTION W/ INTRAOCULAR LENS  IMPLANT, BILATERAL Bilateral ~ 2014  . ESOPHAGOGASTRODUODENOSCOPY N/A 03/11/2015   Procedure: ESOPHAGOGASTRODUODENOSCOPY (EGD);  Surgeon: Vida Rigger, MD;  Location: [facility] ENDOSCOPY;  Service: Endoscopy;  Laterality: N/A;       Home Medications    Prior to Admission medications   Medication Sig Start Date End Date Taking? Authorizing Provider  amLODipine (NORVASC) 5 MG tablet Take 1 tablet (5 mg total) by mouth daily. 05/24/16   Nishant Dhungel, MD  chlordiazePOXIDE (LIBRIUM) 25 MG capsule 50mg  PO TID x 1D, then 25-50mg  PO BID X 1D, then 25-50mg  PO QD X 1D 07/18/16   Geoffery Lyons, MD  feeding supplement, ENSURE ENLIVE, (ENSURE ENLIVE)  LIQD Take 237 mLs by mouth 2 (two) times daily between meals. Patient not taking: Reported on 07/18/2016 05/24/16   Theda BelfastNishant Dhungel, MD  folic acid (FOLVITE) 1 MG tablet Take 1 tablet (1 mg total) by mouth daily. 05/24/16   Nishant Dhungel, MD  LORazepam (ATIVAN) 1 MG tablet Take 1 tablet (1 mg total) by mouth every 8 (eight) hours as needed for anxiety (alcohol withdrawal symptoms). 05/24/16   Nishant Dhungel, MD  Multiple Vitamin (MULTIVITAMIN WITH MINERALS) TABS tablet Take 1 tablet by mouth daily. 05/24/16    Nishant Dhungel, MD  nicotine (NICODERM CQ - DOSED IN MG/24 HOURS) 14 mg/24hr patch Place 1 patch (14 mg total) onto the skin daily. Patient not taking: Reported on 07/18/2016 05/24/16   Nishant Dhungel, MD  omeprazole (PRILOSEC) 20 MG capsule Take 1 capsule (20 mg total) by mouth daily. 05/24/16   Nishant Dhungel, MD  QUEtiapine (SEROQUEL) 50 MG tablet Take 1 tablet (50 mg total) by mouth at bedtime. 05/24/16   Nishant Dhungel, MD  thiamine 100 MG tablet Take 1 tablet (100 mg total) by mouth daily. 05/24/16   Nishant Dhungel, MD  vitamin B-12 1000 MCG tablet Take 1 tablet (1,000 mcg total) by mouth daily. 05/24/16   Eddie NorthNishant Dhungel, MD    Family History Family History  Problem Relation Age of Onset  . Diabetes Mother 6760    deceased  . Diabetes Sister   . Heart attack Father [id number]    deceased  . Heart disease Sister 3636    Social History Social History  Substance Use Topics  . Smoking status: Not on file  . Smokeless tobacco: Not on file  . Alcohol use 6.0 oz/week    10 Shots of liquor per week     Comment: 03/11/2015 pt states he quit 10/2012; son states he's "drinking 1/2 gallon of alcohol, vodka, per 3 weeks"     Allergies   Review of patient's allergies indicates no known allergies.   Review of Systems Review of Systems  Genitourinary: Positive for flank pain.  All other systems reviewed and are negative.    Physical Exam Updated Vital Signs BP 144/94   Pulse 85   Resp 16   SpO2 100%   Physical Exam  Constitutional: He is oriented to person, place, and time. He appears well-developed and well-nourished.  HENT:  Head: Normocephalic and atraumatic.  Cardiovascular: Normal rate and regular rhythm.   No murmur heard. Pulmonary/Chest: Effort normal and breath sounds normal. No respiratory distress. He exhibits no tenderness.  Abdominal: Soft. There is no tenderness. There is no rebound and no guarding.  Musculoskeletal: He exhibits no edema or tenderness.  Neurological:  He is alert and oriented to person, place, and time.  Skin: Skin is warm and dry.  Psychiatric: He has a normal mood and affect. His behavior is normal.  Nursing note and vitals reviewed.    ED Treatments / Results  Labs (all labs ordered are listed, but only abnormal results are displayed) Labs Reviewed  COMPREHENSIVE METABOLIC PANEL - Abnormal; Notable for the following:       Result Value   BUN 23 (*)    Creatinine, Ser 1.27 (*)    Calcium 8.8 (*)    AST 53 (*)    GFR calc non Af Amer 58 (*)    All other components within normal limits  LIPASE, BLOOD - Abnormal; Notable for the following:    Lipase 89 (*)    All other components within normal  limits  CBC WITH DIFFERENTIAL/PLATELET - Abnormal; Notable for the following:    WBC 3.9 (*)    Hemoglobin 11.9 (*)    HCT 35.7 (*)    All other components within normal limits  URINALYSIS, ROUTINE W REFLEX MICROSCOPIC ([facility])    EKG  EKG Interpretation None       Radiology Ct Renal Stone Study  Result Date: 07/30/2016 CLINICAL DATA:  Right lower abdominal pain radiating to the flank. Nausea. EXAM: CT ABDOMEN AND PELVIS WITHOUT CONTRAST TECHNIQUE: Multidetector CT imaging of the abdomen and pelvis was performed following the standard protocol without IV contrast. COMPARISON:  03/20/2012 FINDINGS: Lower chest: No acute abnormality. Hepatobiliary: Hepatic steatosis.  Otherwise normal. Pancreas: Unremarkable. No pancreatic ductal dilatation or surrounding inflammatory changes. Spleen: Normal in size without focal abnormality. Adrenals/Urinary Tract: Slight increased size and density of both adrenal glands consistent with benign adrenal hyperplasia. Chronic soft tissue stranding around the kidneys. No hydronephrosis. No renal or ureteral calculi. Bladder is normal. Stomach/Bowel: Stomach is within normal limits. Appendix appears normal. No evidence of bowel wall thickening, distention, or inflammatory changes. Vascular/Lymphatic:  Aortic atherosclerosis.  No adenopathy. Reproductive: Prostate is unremarkable. Other: No abdominal wall hernia or abnormality. No abdominopelvic ascites. Musculoskeletal: No acute abnormalities. Degenerative changes throughout most of the lumbar spine. Chondrocalcinosis in the hips. IMPRESSION: No acute abnormalities.Hepatic steatosis. Electronically Signed   By: Francene BoyersJames  Maxwell M.D.   On: 07/30/2016 19:00    Procedures Procedures (including critical care time)  Medications Ordered in ED Medications - No data to display   Initial Impression / Assessment and Plan / ED Course  I have reviewed the triage vital signs and the nursing notes.  Pertinent labs & imaging results that were available during my care of the patient were reviewed by me and considered in my medical decision making (see chart for details).  Clinical Course   Patient here for evaluation of right flank pain. His pain is resolving on evaluation in the emergency department. CT with no evidence of stone. Labs near his baseline. Patient eloped from the department prior to being able to reassess or obtain urine sample.  Final Clinical Impressions(s) / ED Diagnoses   Final diagnoses:  Flank pain    New Prescriptions Discharge Medication List as of 07/30/2016  7:50 PM       Tilden FossaElizabeth Bryttani Blew, MD 07/30/16 1956

## 2016-07-30 NOTE — ED Notes (Signed)
Pt not in room when md came to see

## 2016-07-30 NOTE — ED Notes (Signed)
Pt still not in room and will be discharged AMA. Pt did not receive AMA information or sign.

## 2016-07-30 NOTE — ED Notes (Signed)
Bed: WA17 Expected date: 07/30/16 Expected time: 5:42 PM Means of arrival: Ambulance Comments: Flank pain

## 2016-07-30 NOTE — ED Triage Notes (Signed)
Per pt, states right lower abdominal pain radiating to flank-was here for the same yesterday-ETOH

## 2016-07-30 NOTE — ED Notes (Signed)
Off floor for testing 

## 2016-08-15 DIAGNOSIS — D519 Vitamin B12 deficiency anemia, unspecified: Secondary | ICD-10-CM | POA: Diagnosis not present

## 2016-08-15 DIAGNOSIS — G4089 Other seizures: Secondary | ICD-10-CM | POA: Diagnosis not present

## 2016-08-15 DIAGNOSIS — M21371 Foot drop, right foot: Secondary | ICD-10-CM | POA: Diagnosis not present

## 2016-08-15 DIAGNOSIS — I10 Essential (primary) hypertension: Secondary | ICD-10-CM | POA: Diagnosis not present

## 2016-09-22 DIAGNOSIS — Z9181 History of falling: Secondary | ICD-10-CM | POA: Diagnosis not present

## 2016-09-22 DIAGNOSIS — I10 Essential (primary) hypertension: Secondary | ICD-10-CM | POA: Diagnosis not present

## 2016-09-22 DIAGNOSIS — Z87440 Personal history of urinary (tract) infections: Secondary | ICD-10-CM | POA: Diagnosis not present

## 2016-09-22 DIAGNOSIS — M21371 Foot drop, right foot: Secondary | ICD-10-CM | POA: Diagnosis not present

## 2016-09-22 DIAGNOSIS — K219 Gastro-esophageal reflux disease without esophagitis: Secondary | ICD-10-CM | POA: Diagnosis not present

## 2016-09-22 DIAGNOSIS — F17210 Nicotine dependence, cigarettes, uncomplicated: Secondary | ICD-10-CM | POA: Diagnosis not present

## 2016-09-22 DIAGNOSIS — G4089 Other seizures: Secondary | ICD-10-CM | POA: Diagnosis not present

## 2016-09-22 DIAGNOSIS — F1020 Alcohol dependence, uncomplicated: Secondary | ICD-10-CM | POA: Diagnosis not present

## 2016-09-22 DIAGNOSIS — D519 Vitamin B12 deficiency anemia, unspecified: Secondary | ICD-10-CM | POA: Diagnosis not present

## 2016-10-10 ENCOUNTER — Encounter (HOSPITAL_COMMUNITY): Payer: Self-pay | Admitting: Emergency Medicine

## 2016-10-10 ENCOUNTER — Emergency Department (HOSPITAL_COMMUNITY): Payer: Medicare HMO

## 2016-10-10 ENCOUNTER — Emergency Department (HOSPITAL_COMMUNITY)
Admission: EM | Admit: 2016-10-10 | Discharge: 2016-10-10 | Disposition: A | Discharge status: Home / Self Care | Payer: Medicare HMO | Attending: Emergency Medicine | Admitting: Emergency Medicine

## 2016-10-10 DIAGNOSIS — R52 Pain, unspecified: Secondary | ICD-10-CM | POA: Diagnosis not present

## 2016-10-10 DIAGNOSIS — R0781 Pleurodynia: Secondary | ICD-10-CM | POA: Diagnosis not present

## 2016-10-10 DIAGNOSIS — S299XXA Unspecified injury of thorax, initial encounter: Secondary | ICD-10-CM | POA: Insufficient documentation

## 2016-10-10 DIAGNOSIS — I110 Hypertensive heart disease with heart failure: Secondary | ICD-10-CM | POA: Insufficient documentation

## 2016-10-10 DIAGNOSIS — W010XXA Fall on same level from slipping, tripping and stumbling without subsequent striking against object, initial encounter: Secondary | ICD-10-CM | POA: Insufficient documentation

## 2016-10-10 DIAGNOSIS — I5032 Chronic diastolic (congestive) heart failure: Secondary | ICD-10-CM | POA: Insufficient documentation

## 2016-10-10 DIAGNOSIS — M25512 Pain in left shoulder: Secondary | ICD-10-CM | POA: Diagnosis not present

## 2016-10-10 DIAGNOSIS — Y999 Unspecified external cause status: Secondary | ICD-10-CM | POA: Diagnosis not present

## 2016-10-10 DIAGNOSIS — Y929 Unspecified place or not applicable: Secondary | ICD-10-CM | POA: Insufficient documentation

## 2016-10-10 DIAGNOSIS — R0602 Shortness of breath: Secondary | ICD-10-CM | POA: Diagnosis not present

## 2016-10-10 DIAGNOSIS — R079 Chest pain, unspecified: Secondary | ICD-10-CM | POA: Diagnosis not present

## 2016-10-10 DIAGNOSIS — Y939 Activity, unspecified: Secondary | ICD-10-CM | POA: Diagnosis not present

## 2016-10-10 DIAGNOSIS — S29001A Unspecified injury of muscle and tendon of front wall of thorax, initial encounter: Secondary | ICD-10-CM | POA: Diagnosis not present

## 2016-10-10 DIAGNOSIS — R0789 Other chest pain: Secondary | ICD-10-CM

## 2016-10-10 LAB — BASIC METABOLIC PANEL
ANION GAP: 10 (ref 5–15)
BUN: 16 mg/dL (ref 6–20)
CO2: 26 mmol/L (ref 22–32)
Calcium: 8.7 mg/dL — ABNORMAL LOW (ref 8.9–10.3)
Chloride: 110 mmol/L (ref 101–111)
Creatinine, Ser: 1.14 mg/dL (ref 0.61–1.24)
GFR calc Af Amer: >60 mL/min (ref >60)
GFR calc non Af Amer: >60 mL/min (ref >60)
GLUCOSE: 113 mg/dL — AB (ref 65–99)
POTASSIUM: 3.8 mmol/L (ref 3.5–5.1)
Sodium: 146 mmol/L — ABNORMAL HIGH (ref 135–145)

## 2016-10-10 LAB — CBC
HEMATOCRIT: 31.4 % — AB (ref 39.0–52.0)
HEMOGLOBIN: 10.2 g/dL — AB (ref 13.0–17.0)
MCH: 25.8 pg — AB (ref 26.0–34.0)
MCHC: 32.5 g/dL (ref 30.0–36.0)
MCV: 79.5 fL (ref 78.0–100.0)
Platelets: 220 10*3/uL (ref 150–400)
RBC: 3.95 MIL/uL — ABNORMAL LOW (ref 4.22–5.81)
RDW: 16.4 % — ABNORMAL HIGH (ref 11.5–15.5)
WBC: 3.8 10*3/uL — ABNORMAL LOW (ref 4.0–10.5)

## 2016-10-10 LAB — I-STAT TROPONIN, ED: Troponin i, poc: 0.02 ng/mL (ref 0.00–0.08)

## 2016-10-10 LAB — ETHANOL: Alcohol, Ethyl (B): 315 mg/dL (ref <5)

## 2016-10-10 MED ORDER — GI COCKTAIL ~~LOC~~
30.0000 mL | Freq: Once | ORAL | Status: AC
  Administered 2016-10-10: 30 mL via ORAL
  Filled 2016-10-10: qty 30

## 2016-10-10 MED ORDER — IBUPROFEN 800 MG PO TABS: 800.0000 mg | ORAL_TABLET | Freq: Three times a day (TID) | ORAL | 0 refills | Status: AC

## 2016-10-10 MED ORDER — IBUPROFEN 800 MG PO TABS
800.0000 mg | ORAL_TABLET | Freq: Once | ORAL | Status: AC
  Administered 2016-10-10: 800 mg via ORAL
  Filled 2016-10-10: qty 1

## 2016-10-10 NOTE — ED Provider Notes (Signed)
Emergency Department Provider Note   I have reviewed the triage vital signs and the nursing notes.   HISTORY  Chief Complaint Chest Pain   HPI Troy Torres is a 66 y.o. male with PMH of EtOH abuse, anxiety, esophagitis, diastolic CHF, and HTN presents to the emergency department for evaluation of chest pain after fall in the shower this morning. He states that he tripped and fell and shortly afterwards began having left lateral chest wall pain. Describes the pain as severe, nonradiating, and worse with movement. States when he fell he does not believe he struck the left side of his chest on anything. No head trauma. Denies neck pain. Denies loss of consciousness. The patient states that he was drinking alcohol yesterday evening but none this morning. He did not take any medications to assist with pain. He tried to take a nap and when pain did not resolve he called EMS.   Past Medical History:  Diagnosis Date  . Alcohol abuse    with history of DTs // states last drink was 10/2012  . Angiodysplasia of cecum 10/2011   3 medium sized angiodysplastic lesions per colonoscopy (Dr. Madilyn Fireman)  . Antral ulcer 10/2011   Small ulcer noted per Endoscopy (Dr. Madilyn Fireman)  . Anxiety   . Chronic diastolic heart failure (HCC)    grade 2 per 2D echo (12/2012)  . Esophagitis 10/2011   per EGD (10/2011)  . History of blood product transfusion ~ 2014   LGIB  . Hypertension   . 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 Iron deficiency anemia due to chronic blood loss    BL Hgb 12-13  . Lower GI bleeding 2014  . Seizure [facility])    generalized major motor seizures // Started after Head injury 2008 // Followed at [facility] by Dr. Sandria Manly  . Tobacco abuse     Patient Active Problem List   Diagnosis Date Noted  . B12  deficiency 05/24/2016  . Essential hypertension, benign 05/24/2016  . Hypomagnesemia   . UTI (lower urinary tract infection) 05/12/2016  . Acute encephalopathy 05/12/2016  . Abdominal pain 05/12/2016  . GERD (gastroesophageal reflux disease) 05/12/2016  . 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 Alcohol withdrawal delirium, acute, hyperactive (HCC) 05/12/2016  . Upper GI bleed 03/11/2015  . Acute GI bleeding 03/11/2015  . Acute blood loss anemia 03/11/2015  . Essential hypertension 03/11/2015  . Hypertension 01/29/2013  . Chronic diastolic heart failure (HCC)   . Pre-syncope 01/07/2013  . Iron deficiency anemia due to chronic blood loss   . Gastritis without bleeding 12/19/2011  . Gastric ulcer 12/19/2011  . Tobacco abuse 10/26/2011  . History of seizure disorder 09/26/2011    Past Surgical History:  Procedure Laterality Date  . CATARACT EXTRACTION W/ INTRAOCULAR LENS  IMPLANT, BILATERAL Bilateral ~ 2014  . ESOPHAGOGASTRODUODENOSCOPY N/A 03/11/2015   Procedure: ESOPHAGOGASTRODUODENOSCOPY (EGD);  Surgeon: Vida Rigger, MD;  Location: [facility] ENDOSCOPY;  Service: Endoscopy;  Laterality: N/A;    Current Outpatient Rx  . Order #: [id number] Class: Normal  . Order #: [id number] Class: Print  . Order #: [id number] Class: Normal  . Order #: [id number] Class: Normal  . Order #: [id number] Class: Print  . Order #: [id number] Class: Print  . Order #: [id number] Class: Normal  . Order #: [id number] Class: Normal  . Order #: [id number] Class: Print  . Order #:  [id number] Class: Normal  . Order #: [id number] Class: Normal  . Order #: [id number] Class: Normal    Allergies Patient has no known allergies.  Family History  Problem Relation Age of Onset  . Diabetes Mother 65    deceased  . Diabetes Sister   . Heart attack Father 33    deceased  . Heart disease Sister 57    Social History Social History  Substance Use Topics  . Smoking status: Not on file  . Smokeless tobacco: Not on file  . Alcohol use 6.0  oz/week    10 Shots of liquor per week     Comment: 03/11/2015 pt states he quit 10/2012; son states he's "drinking 1/2 gallon of alcohol, vodka, per 3 weeks"    Review of Systems  Constitutional: No fever/chills Eyes: No visual changes. ENT: No sore throat. Cardiovascular: Positive chest pain. Respiratory: Denies shortness of breath. Gastrointestinal: No abdominal pain.  No nausea, no vomiting.  No diarrhea.  No constipation. Genitourinary: Negative for dysuria. Musculoskeletal: Negative for back pain. Skin: Negative for rash. Neurological: Negative for headaches, focal weakness or numbness.  10-point ROS otherwise negative.  ____________________________________________   PHYSICAL EXAM:  VITAL SIGNS: ED Triage Vitals  Enc Vitals Group     BP 10/10/16 1504 128/76     Pulse Rate 10/10/16 1504 90     Resp 10/10/16 1504 16     Temp 10/10/16 1504 98.1 F (36.7 C)     SpO2 10/10/16 1458 95 %     Pain Score 10/10/16 1503 8   Constitutional: Alert and oriented. Well appearing and in no acute distress. Eyes: Conjunctivae are normal.  Head: Atraumatic. Nose: No congestion/rhinnorhea. Mouth/Throat: Mucous membranes are moist.  Oropharynx non-erythematous. Neck: No stridor. Cardiovascular: Normal rate, regular rhythm. Good peripheral circulation. Grossly normal heart sounds.   Respiratory: Normal respiratory effort.  No retractions. Lungs CTAB. Gastrointestinal: Soft and nontender. No distention.  Musculoskeletal: No lower extremity tenderness nor edema. No gross deformities of extremities. Positive point tenderness to palpation of the left chest.  Neurologic:  Normal speech and language. No gross focal neurologic deficits are appreciated.  Skin:  Skin is warm, dry and intact. No rash noted.  ____________________________________________   LABS (all labs ordered are listed, but only abnormal results are displayed)  Labs Reviewed  BASIC METABOLIC PANEL - Abnormal; Notable for  the following:       Result Value   Sodium 146 (*)    Glucose, Bld 113 (*)    Calcium 8.7 (*)    All other components within normal limits  CBC - Abnormal; Notable for the following:    WBC 3.8 (*)    RBC 3.95 (*)    Hemoglobin 10.2 (*)    HCT 31.4 (*)    MCH 25.8 (*)    RDW 16.4 (*)    All other components within normal limits  ETHANOL - Abnormal; Notable for the following:    Alcohol, Ethyl (B) 315 (*)    All other components within normal limits  I-STAT TROPOININ, ED   ____________________________________________  EKG   EKG Interpretation  Date/Time:  Tuesday October 10 2016 15:03:38 EST Ventricular Rate:  85 PR Interval:    QRS Duration: 82 QT Interval:  355 QTC Calculation: 423 R Axis:   70 Text Interpretation:  Sinus rhythm Minimal ST elevation, anterior leads No STEMI. Similar to prior tracings.  Confirmed by LONG MD, JOSHUA [phone]) on 10/10/2016 4:03:45 PM       ____________________________________________  RADIOLOGY  Dg Chest 2 View  Result Date: 10/10/2016 CLINICAL DATA:  Severe left chest pain with shortness of breath began this morning. Current smoker. History of seizure disorder, post traumatic intercerebral bleed, alcohol abuse, tobacco abuse. EXAM: CHEST  2 VIEW COMPARISON:  CT scan of the chest of July 18, 2016 and chest x-ray of the same date. FINDINGS: The lungs are mildly hyperinflated. There is no focal infiltrate. There is no pleural effusion. The heart and pulmonary vascularity are normal. The mediastinum is normal in width. There is calcification in the wall of the aortic arch. The observed bony thorax exhibits no acute abnormality. IMPRESSION: Mild hyperinflation may reflect chronic bronchitis and the patient's smoking history there is no pneumonia nor CHF. Thoracic aortic atherosclerosis. Electronically Signed   By: David  SwazilandJordan M.D.   On: 10/10/2016 16:06   Dg Ribs Unilateral Left  Result Date: 10/10/2016 CLINICAL DATA:  Larey SeatFell in shower 2 days  ago.  LEFT rib pain. EXAM: LEFT RIBS - 2 VIEW COMPARISON:  Chest radiograph October 10, 2016 at 1600 hours. FINDINGS: No fracture or other bone lesions are seen involving the ribs. IMPRESSION: Negative. Electronically Signed   By: Awilda Metroourtnay  Bloomer M.D.   On: 10/10/2016 17:16    ____________________________________________   PROCEDURES  Procedure(s) performed:   Procedures  None ____________________________________________   INITIAL IMPRESSION / ASSESSMENT AND PLAN / ED COURSE  Pertinent labs & imaging results that were available during my care of the patient were reviewed by me and considered in my medical decision making (see chart for details).  Patient presents to the emergency department for evaluation of left lateral chest pain after fall in the shower this morning. He has focal tenderness to the left lateral chest wall. No significant abdominal discomfort. Patient seems slightly agitated at times and endorses drinking alcohol but none this morning. Plan for GI cocktail and Motrin. Very low suspicion for ACS at this time. Negative troponin drawn at 15:30 with pain onset at 8 AM. Suspect MSK etiology clinically.   05:33 PM Rib imaging with no acute fracture. Patient is intoxicated with EtOH level of 315. Will allow patient to sober and reassess. Troponin negative. No clear indication for trending in this scenario with initial lab being drawn 7 hours since pain onset.   06:29 PM Patient is feeling better. He is walking around the department stable gait. He does not drive. He plans to walk home or call a cab. Plan for discharge at this time.   At this time, I do not feel there is any life-threatening condition present. I have reviewed and discussed all results (EKG, imaging, lab, urine as appropriate), exam findings with patient. I have reviewed nursing notes and appropriate previous records.  I feel the patient is safe to be discharged home without further emergent workup. Discussed  usual and customary return precautions. Patient and family (if present) verbalize understanding and are comfortable with this plan.  Patient will follow-up with their primary care provider. If they do not have a primary care provider, information for follow-up has been provided to them. All questions have been answered.  ____________________________________________  FINAL CLINICAL IMPRESSION(S) / ED DIAGNOSES  Final diagnoses:  Injury of chest wall, initial encounter     MEDICATIONS GIVEN DURING THIS VISIT:  Medications  gi cocktail (Maalox,Lidocaine,Donnatal) (30 mLs Oral Given 10/10/16 1631)  ibuprofen (ADVIL,MOTRIN) tablet 800 mg (800 mg Oral Given 10/10/16 1631)     NEW OUTPATIENT MEDICATIONS STARTED DURING THIS VISIT:  Discharge Medication List as of  10/10/2016  6:37 PM    START taking these medications   Details  ibuprofen (ADVIL,MOTRIN) 800 MG tablet Take 1 tablet (800 mg total) by mouth 3 (three) times daily., Starting Tue 10/10/2016, Print          Note:  This document was prepared using Dragon voice recognition software and may include unintentional dictation errors.  Alona Bene, MD Emergency Medicine   Maia Plan, MD 10/11/16 [phone]

## 2016-10-10 NOTE — ED Notes (Signed)
Pt ambulated without issue in room.

## 2016-10-10 NOTE — ED Triage Notes (Signed)
Per [facility], pt from home, c/o gradual onset CP 0800, L shoulder and arm, ETOH on board. C/o constant pain. 12 lead sinus tach, 324 asa, 1 nitro, no change in pain. Pt in NAD.

## 2016-10-10 NOTE — ED Notes (Signed)
315 etoh critical lab notification, Dr. Jacqulyn BathLong notified.

## 2016-10-10 NOTE — ED Notes (Signed)
Pt returned from xray

## 2016-10-10 NOTE — Discharge Instructions (Signed)

## 2016-10-18 ENCOUNTER — Other Ambulatory Visit: Payer: Self-pay | Admitting: *Deleted

## 2016-10-18 NOTE — Patient Outreach (Signed)
  [facility] Care Management  10/18/2016  Troy PascalJames Torres October 30, 1950 [id number]   RN Health Coach  Attempted screening  outreach call to patient.  Patient was unavailable. No voice mail pickup. Plan: RN will call patient again within 14 days.   Gean MaidensFrances Malayah Demuro BSN RN [facility] Care Management [phone]

## 2016-10-22 DIAGNOSIS — I469 Cardiac arrest, cause unspecified: Secondary | ICD-10-CM | POA: Diagnosis not present

## 2016-10-24 ENCOUNTER — Other Ambulatory Visit: Payer: Self-pay | Admitting: *Deleted

## 2016-10-24 NOTE — Patient Outreach (Signed)
[facility] Care Management  10/24/2016  Troy Torres Aug 29, 1951 [id number]   RN Health Coach  Attempted screening  outreach call to patient.  Patient was unavailable.This phone number has been disconnected.  Plan: RN will call try to patient again within 14 days.  Troy Torres BSN RN [facility] Care Management [phone]

## 2016-10-31 ENCOUNTER — Other Ambulatory Visit: Payer: Self-pay | Admitting: *Deleted

## 2016-10-31 NOTE — Patient Outreach (Signed)
[facility] Care Management  10/31/2016  Trinda PascalJames Earnest 26-Feb-1951 [id number]  RN Health Coach  Attempted screening  outreach call to patient.  Patient was unavailable. No voice mail pickup. Per recording this is an invalid number. Case closed at this time phone disconnected.  Gean MaidensFrances Ogden Handlin BSN RN [facility] Care Management [phone]

## 2016-11-02 DIAGNOSIS — 419620001 Death: Secondary | SNOMED CT | POA: Diagnosis not present

## 2016-11-02 DEATH — deceased

## 2017-06-01 IMAGING — CR DG CHEST 2V
2 series · 2 of 2 positions shown · non-contrast
Comparison: CT scan of the chest July 18, 2016 and chest
x-ray of the same date.

CLINICAL DATA: Severe left chest pain with shortness of breath
began this morning. Current smoker. History of seizure disorder,
post traumatic intercerebral bleed, alcohol abuse, tobacco abuse.

EXAM:
CHEST  2 VIEW

[chest lat]
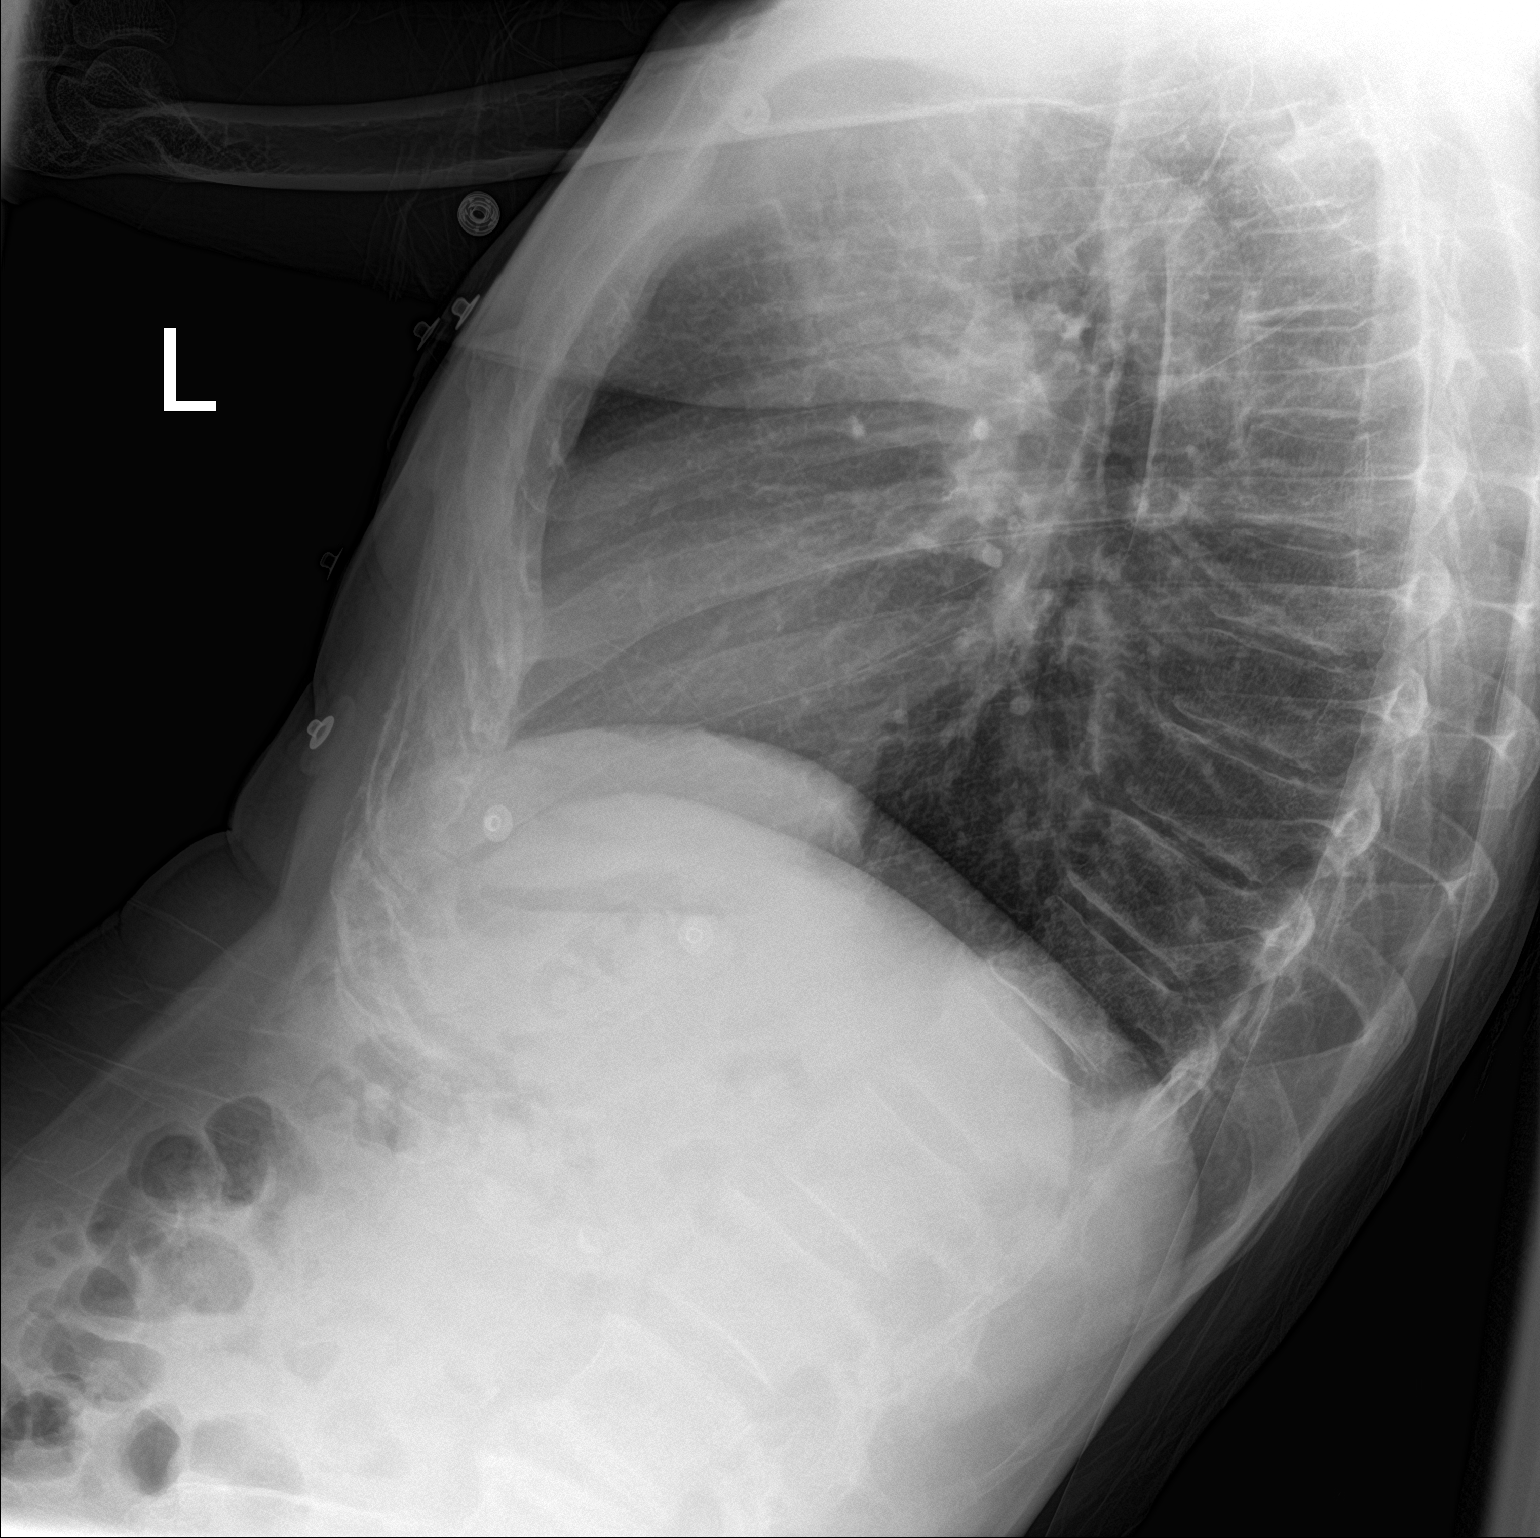

[chest ap]
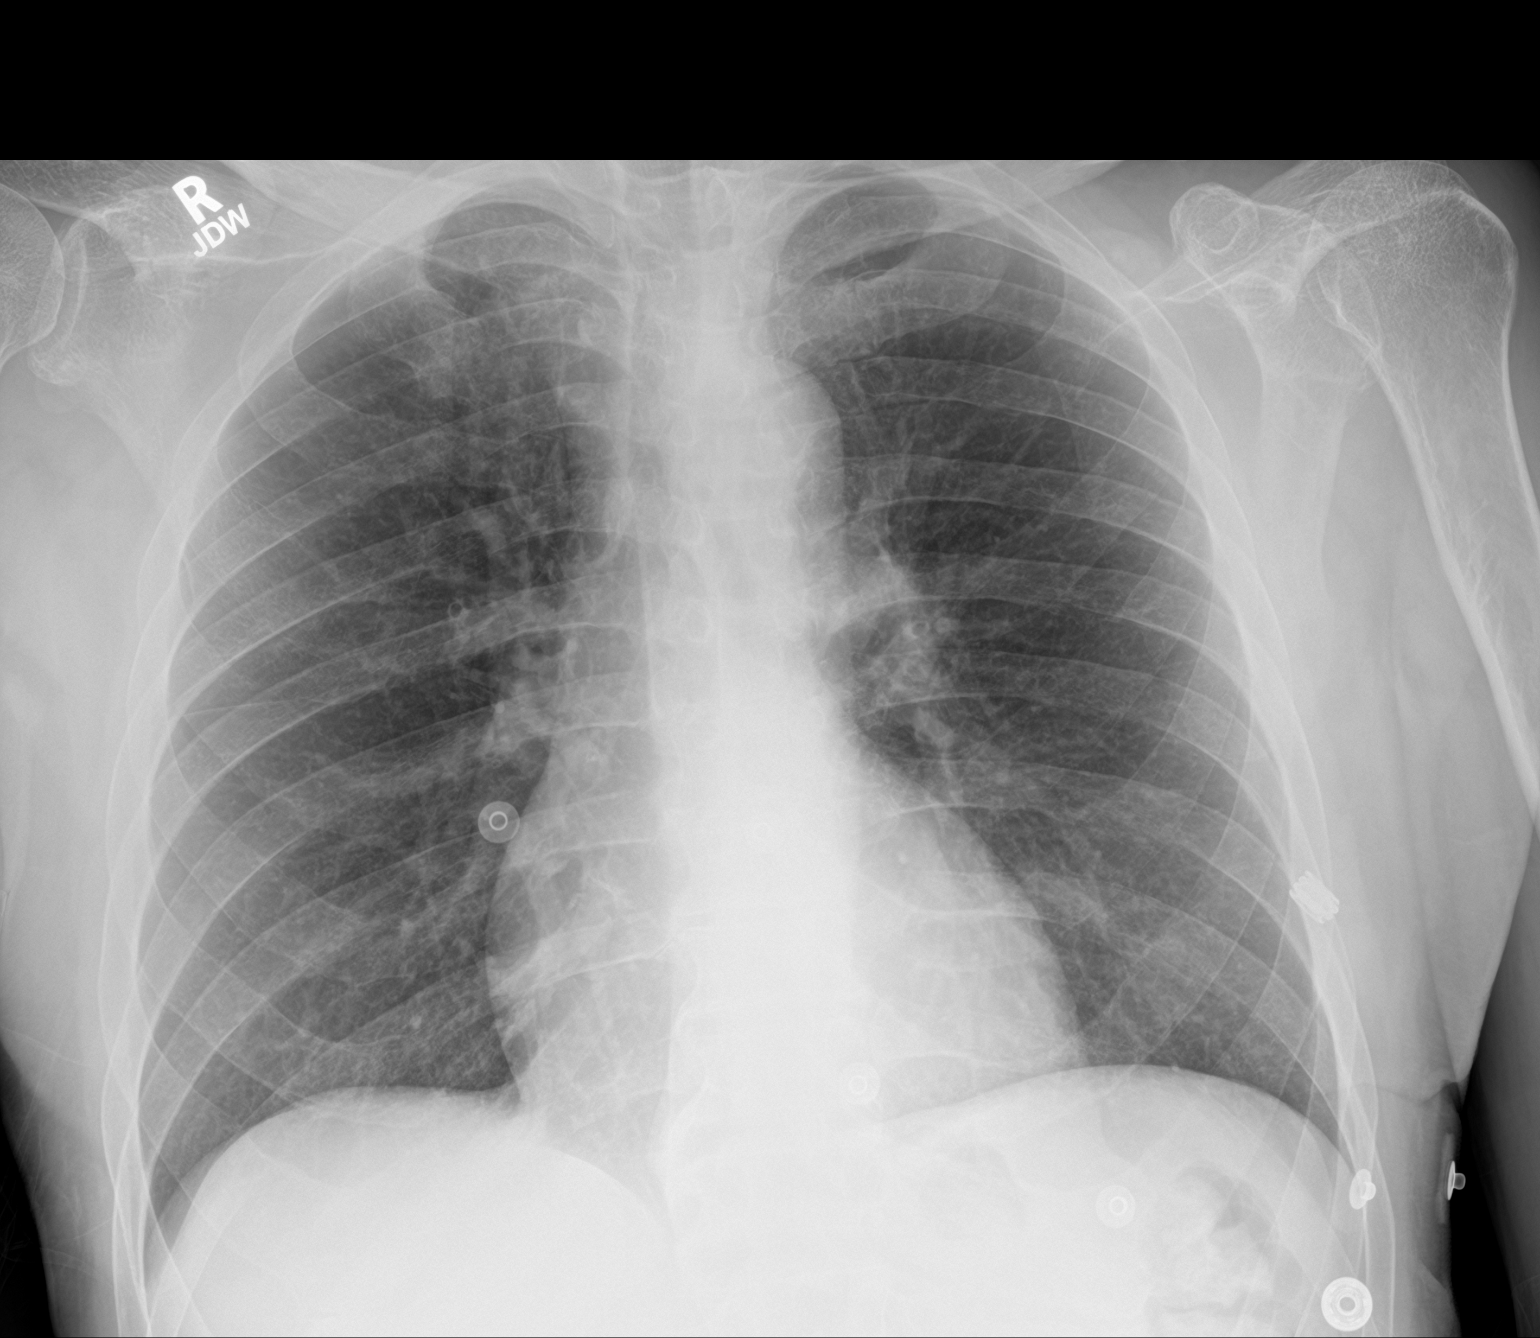

[2 of 2 positions shown; findings below may reference images not displayed]

FINDINGS: The lungs are mildly hyperinflated. There is no focal infiltrate.
There is no pleural effusion. The heart and pulmonary vascularity
are normal. The mediastinum is normal in width. There is
calcification in the wall of the aortic arch. The observed bony
thorax exhibits no acute abnormality.
IMPRESSION: Mild hyperinflation may reflect chronic bronchitis and the patient's
smoking history there is no pneumonia nor CHF.

Thoracic aortic atherosclerosis.
